# Patient Record
Sex: Female | Born: 1945 | Race: White | Hispanic: No | Marital: Married | Smoking: Current every day smoker
Health system: Southern US, Community
[De-identification: ages and names within clinical notes are randomized; demographics above are authoritative.]

## PROBLEM LIST (undated history)

## (undated) DIAGNOSIS — K589 Irritable bowel syndrome without diarrhea: Secondary | ICD-10-CM

## (undated) DIAGNOSIS — I00 Rheumatic fever without heart involvement: Secondary | ICD-10-CM

## (undated) DIAGNOSIS — K759 Inflammatory liver disease, unspecified: Secondary | ICD-10-CM

## (undated) DIAGNOSIS — I6529 Occlusion and stenosis of unspecified carotid artery: Secondary | ICD-10-CM

## (undated) DIAGNOSIS — E079 Disorder of thyroid, unspecified: Secondary | ICD-10-CM

## (undated) DIAGNOSIS — I639 Cerebral infarction, unspecified: Secondary | ICD-10-CM

## (undated) DIAGNOSIS — I739 Peripheral vascular disease, unspecified: Secondary | ICD-10-CM

## (undated) DIAGNOSIS — E785 Hyperlipidemia, unspecified: Secondary | ICD-10-CM

## (undated) DIAGNOSIS — I219 Acute myocardial infarction, unspecified: Secondary | ICD-10-CM

## (undated) DIAGNOSIS — I669 Occlusion and stenosis of unspecified cerebral artery: Secondary | ICD-10-CM

## (undated) DIAGNOSIS — F419 Anxiety disorder, unspecified: Secondary | ICD-10-CM

## (undated) DIAGNOSIS — F32A Depression, unspecified: Secondary | ICD-10-CM

## (undated) HISTORY — DX: Disorder of thyroid, unspecified: E07.9

## (undated) HISTORY — DX: Hyperlipidemia, unspecified: E78.5

## (undated) HISTORY — DX: Occlusion and stenosis of unspecified carotid artery: I65.29

## (undated) HISTORY — PX: CHOLECYSTECTOMY: SHX55

## (undated) HISTORY — DX: Rheumatic fever without heart involvement: I00

## (undated) HISTORY — DX: Acute myocardial infarction, unspecified: I21.9

## (undated) HISTORY — DX: Occlusion and stenosis of unspecified cerebral artery: I66.9

## (undated) HISTORY — DX: Cerebral infarction, unspecified: I63.9

## (undated) HISTORY — PX: EYE SURGERY: SHX253

## (undated) HISTORY — DX: Inflammatory liver disease, unspecified: K75.9

---

## 1998-11-24 ENCOUNTER — Other Ambulatory Visit: Admission: RE | Admit: 1998-11-24 | Discharge: 1998-11-24 | Payer: Self-pay | Admitting: Family Medicine

## 1999-02-22 DIAGNOSIS — I669 Occlusion and stenosis of unspecified cerebral artery: Secondary | ICD-10-CM

## 1999-02-22 HISTORY — PX: PR VEIN BYPASS GRAFT,AORTO-FEM-POP: 35551

## 1999-02-22 HISTORY — DX: Occlusion and stenosis of unspecified cerebral artery: I66.9

## 1999-11-25 ENCOUNTER — Encounter (INDEPENDENT_AMBULATORY_CARE_PROVIDER_SITE_OTHER): Payer: Self-pay | Admitting: Specialist

## 1999-11-25 ENCOUNTER — Inpatient Hospital Stay: Admission: RE | Admit: 1999-11-25 | Discharge: 1999-11-26 | Payer: Self-pay | Admitting: Vascular Surgery

## 1999-11-25 HISTORY — PX: CAROTID ENDARTERECTOMY: SUR193

## 2003-12-24 ENCOUNTER — Ambulatory Visit (HOSPITAL_COMMUNITY): Admission: RE | Admit: 2003-12-24 | Discharge: 2003-12-24 | Payer: Self-pay | Admitting: Vascular Surgery

## 2003-12-25 ENCOUNTER — Ambulatory Visit: Payer: Self-pay | Admitting: Family Medicine

## 2003-12-30 ENCOUNTER — Ambulatory Visit: Payer: Self-pay

## 2004-01-09 ENCOUNTER — Ambulatory Visit: Payer: Self-pay | Admitting: Family Medicine

## 2004-02-02 ENCOUNTER — Ambulatory Visit: Payer: Self-pay | Admitting: Family Medicine

## 2004-02-09 ENCOUNTER — Ambulatory Visit: Payer: Self-pay | Admitting: Family Medicine

## 2004-02-13 ENCOUNTER — Ambulatory Visit: Payer: Self-pay | Admitting: Family Medicine

## 2004-02-22 HISTORY — PX: CAROTID STENT: SHX1301

## 2004-03-09 ENCOUNTER — Ambulatory Visit: Payer: Self-pay | Admitting: Family Medicine

## 2004-03-11 ENCOUNTER — Ambulatory Visit: Payer: Self-pay | Admitting: Family Medicine

## 2004-03-24 ENCOUNTER — Inpatient Hospital Stay (HOSPITAL_COMMUNITY): Admission: RE | Admit: 2004-03-24 | Discharge: 2004-03-26 | Payer: Self-pay | Admitting: *Deleted

## 2004-03-24 HISTORY — PX: CAROTID ENDARTERECTOMY: SUR193

## 2004-04-13 ENCOUNTER — Ambulatory Visit: Payer: Self-pay | Admitting: Family Medicine

## 2004-04-20 ENCOUNTER — Ambulatory Visit: Payer: Self-pay | Admitting: Family Medicine

## 2004-05-12 ENCOUNTER — Ambulatory Visit: Payer: Self-pay | Admitting: Family Medicine

## 2004-05-27 ENCOUNTER — Ambulatory Visit (HOSPITAL_COMMUNITY): Admission: RE | Admit: 2004-05-27 | Discharge: 2004-05-27 | Payer: Self-pay | Admitting: Vascular Surgery

## 2004-06-08 ENCOUNTER — Ambulatory Visit: Payer: Self-pay | Admitting: Family Medicine

## 2004-06-10 ENCOUNTER — Ambulatory Visit: Payer: Self-pay | Admitting: Cardiology

## 2004-06-18 ENCOUNTER — Ambulatory Visit: Payer: Self-pay | Admitting: Family Medicine

## 2004-07-23 ENCOUNTER — Inpatient Hospital Stay (HOSPITAL_COMMUNITY): Admission: RE | Admit: 2004-07-23 | Discharge: 2004-07-29 | Payer: Self-pay | Admitting: Vascular Surgery

## 2004-08-16 ENCOUNTER — Ambulatory Visit: Payer: Self-pay | Admitting: Family Medicine

## 2004-08-30 ENCOUNTER — Ambulatory Visit: Payer: Self-pay | Admitting: Family Medicine

## 2004-09-15 ENCOUNTER — Ambulatory Visit: Payer: Self-pay | Admitting: Family Medicine

## 2004-09-20 ENCOUNTER — Ambulatory Visit: Payer: Self-pay | Admitting: Family Medicine

## 2004-10-01 ENCOUNTER — Ambulatory Visit: Payer: Self-pay | Admitting: Family Medicine

## 2004-10-18 ENCOUNTER — Ambulatory Visit: Payer: Self-pay | Admitting: Family Medicine

## 2004-11-18 ENCOUNTER — Ambulatory Visit: Payer: Self-pay | Admitting: Family Medicine

## 2004-11-25 ENCOUNTER — Ambulatory Visit: Payer: Self-pay | Admitting: Family Medicine

## 2004-12-20 ENCOUNTER — Ambulatory Visit: Payer: Self-pay | Admitting: Family Medicine

## 2005-01-20 ENCOUNTER — Ambulatory Visit: Payer: Self-pay | Admitting: Family Medicine

## 2005-02-17 ENCOUNTER — Ambulatory Visit: Payer: Self-pay | Admitting: Family Medicine

## 2005-02-23 ENCOUNTER — Ambulatory Visit: Payer: Self-pay | Admitting: Family Medicine

## 2005-03-21 ENCOUNTER — Ambulatory Visit: Payer: Self-pay | Admitting: Family Medicine

## 2005-04-21 ENCOUNTER — Ambulatory Visit: Payer: Self-pay | Admitting: Family Medicine

## 2005-05-23 ENCOUNTER — Ambulatory Visit: Payer: Self-pay | Admitting: Family Medicine

## 2005-06-22 ENCOUNTER — Ambulatory Visit: Payer: Self-pay | Admitting: Cardiology

## 2005-06-22 ENCOUNTER — Ambulatory Visit: Payer: Self-pay | Admitting: Family Medicine

## 2005-07-25 ENCOUNTER — Ambulatory Visit: Payer: Self-pay | Admitting: Family Medicine

## 2005-08-04 ENCOUNTER — Ambulatory Visit: Payer: Self-pay | Admitting: Family Medicine

## 2005-08-29 ENCOUNTER — Ambulatory Visit: Payer: Self-pay | Admitting: Family Medicine

## 2005-09-14 ENCOUNTER — Ambulatory Visit: Payer: Self-pay | Admitting: Family Medicine

## 2005-09-29 ENCOUNTER — Ambulatory Visit: Payer: Self-pay | Admitting: Family Medicine

## 2005-10-31 ENCOUNTER — Ambulatory Visit: Payer: Self-pay | Admitting: Family Medicine

## 2005-12-06 ENCOUNTER — Ambulatory Visit: Payer: Self-pay | Admitting: Family Medicine

## 2005-12-23 ENCOUNTER — Ambulatory Visit: Payer: Self-pay | Admitting: Family Medicine

## 2006-01-06 ENCOUNTER — Ambulatory Visit: Payer: Self-pay | Admitting: Family Medicine

## 2006-01-18 ENCOUNTER — Ambulatory Visit: Payer: Self-pay | Admitting: Family Medicine

## 2006-02-07 ENCOUNTER — Ambulatory Visit: Payer: Self-pay | Admitting: Family Medicine

## 2006-02-15 ENCOUNTER — Ambulatory Visit: Payer: Self-pay | Admitting: Family Medicine

## 2006-03-17 ENCOUNTER — Ambulatory Visit: Payer: Self-pay | Admitting: Family Medicine

## 2006-03-28 ENCOUNTER — Ambulatory Visit: Payer: Self-pay | Admitting: Family Medicine

## 2006-03-30 ENCOUNTER — Ambulatory Visit: Payer: Self-pay | Admitting: Family Medicine

## 2006-04-25 ENCOUNTER — Ambulatory Visit: Payer: Self-pay | Admitting: Vascular Surgery

## 2006-05-18 ENCOUNTER — Ambulatory Visit: Payer: Self-pay | Admitting: Family Medicine

## 2006-05-22 ENCOUNTER — Ambulatory Visit: Payer: Self-pay | Admitting: Family Medicine

## 2006-06-06 ENCOUNTER — Ambulatory Visit: Payer: Self-pay | Admitting: Family Medicine

## 2006-06-22 ENCOUNTER — Ambulatory Visit: Payer: Self-pay | Admitting: Family Medicine

## 2006-07-25 ENCOUNTER — Ambulatory Visit: Payer: Self-pay | Admitting: Family Medicine

## 2006-08-10 ENCOUNTER — Ambulatory Visit: Payer: Self-pay | Admitting: Family Medicine

## 2007-02-26 ENCOUNTER — Ambulatory Visit (HOSPITAL_COMMUNITY): Admission: RE | Admit: 2007-02-26 | Discharge: 2007-02-26 | Payer: Self-pay | Admitting: Family Medicine

## 2007-04-27 ENCOUNTER — Ambulatory Visit (HOSPITAL_COMMUNITY): Admission: RE | Admit: 2007-04-27 | Discharge: 2007-04-27 | Payer: Self-pay | Admitting: Family Medicine

## 2007-05-08 ENCOUNTER — Ambulatory Visit: Payer: Self-pay | Admitting: Vascular Surgery

## 2007-05-08 DIAGNOSIS — I6529 Occlusion and stenosis of unspecified carotid artery: Secondary | ICD-10-CM

## 2007-05-08 HISTORY — DX: Occlusion and stenosis of unspecified carotid artery: I65.29

## 2008-05-06 ENCOUNTER — Ambulatory Visit: Payer: Self-pay | Admitting: Vascular Surgery

## 2009-05-26 ENCOUNTER — Ambulatory Visit: Payer: Self-pay | Admitting: Vascular Surgery

## 2010-01-01 ENCOUNTER — Ambulatory Visit (HOSPITAL_COMMUNITY): Admission: RE | Admit: 2010-01-01 | Payer: Self-pay | Admitting: Family Medicine

## 2010-01-07 ENCOUNTER — Ambulatory Visit (HOSPITAL_COMMUNITY): Admission: RE | Admit: 2010-01-07 | Discharge: 2010-01-07 | Payer: Self-pay | Admitting: Family Medicine

## 2010-06-08 ENCOUNTER — Other Ambulatory Visit: Payer: Self-pay

## 2010-06-22 ENCOUNTER — Other Ambulatory Visit (INDEPENDENT_AMBULATORY_CARE_PROVIDER_SITE_OTHER): Payer: Medicare PPO

## 2010-06-22 DIAGNOSIS — I6529 Occlusion and stenosis of unspecified carotid artery: Secondary | ICD-10-CM

## 2010-06-22 DIAGNOSIS — Z48812 Encounter for surgical aftercare following surgery on the circulatory system: Secondary | ICD-10-CM

## 2010-07-06 NOTE — Procedures (Signed)
CAROTID DUPLEX EXAM   INDICATION:  Follow up left carotid endarterectomy and stent.   HISTORY:  Diabetes:  No.  Cardiac:  No.  Hypertension:  No.  Smoking:  Yes.  Previous Surgery:  Aortobifem bypass graft and left carotid  endarterectomy on 11/25/99 by Dr. Hart Rochester, left CCA stent on 03/24/04 by  Dr. Madilyn Fireman.  CV History:  Amaurosis Fugax No, Paresthesias No, Hemiparesis No.                                       RIGHT             LEFT  Brachial systolic pressure:         136               132  Brachial Doppler waveforms:         Normal            Normal  Vertebral direction of flow:        Antegrade         Antegrade  DUPLEX VELOCITIES (cm/sec)  CCA peak systolic                   87                191 (stent)  ECA peak systolic                   106               303  ICA peak systolic                   200               174  ICA end diastolic                   58                34  PLAQUE MORPHOLOGY:                  Calcific          Heterogenous  PLAQUE AMOUNT:                      Moderate/severe   Mild  PLAQUE LOCATION:                    ICA               ICA/proximal CCA   IMPRESSION:  1. Doppler velocities suggest 60-79% stenosis of the right internal      carotid artery.  2. Patent left distal common carotid artery stent with an increased      velocity of 191 cm/s noted with no focal narrowing evident.  3. Patent left carotid endarterectomy site with Doppler velocities      suggestive of 40-59% stenosis of the left internal carotid artery;      however, this is most likely a result of the increased velocity of      the distal common carotid artery stent.  4. No significant change in Doppler velocities when compared to the      previous examination on 05/08/07.   ___________________________________________  [name]. Hart Rochester, M.D.   CH/MEDQ  D:  05/06/2008  T:  05/07/2008  Job:  [id number]

## 2010-07-06 NOTE — Assessment & Plan Note (Signed)
OFFICE VISIT   [name], [name]  DOB:  28-Dec-1945                                       05/06/2008  ZOXWR#:[id number]   The patient returns for annual followup regarding her carotid occlusive  disease.  She underwent left carotid endarterectomy by me in 2001 and  had a left common carotid stenosis which was stented by Dr. Madilyn Fireman in  2006.  She has had no neurologic symptoms since that time and she has a  moderate right internal carotid stenosis approximating 60% which we have  been following.  She denies any hemispheric or nonhemispheric TIAs,  amaurosis fugax, diplopia, blurred vision or syncope.  She has had no  chest pain or dyspnea on exertion but does not ambulate long distances.  No claudication since her symptoms are noted by her.  She is taking  aspirin 81 mg per day.   PHYSICAL EXAMINATION:  Vital signs:  Blood pressure 133/78, heart rate  67, respirations 14.  Her carotid pulses are 3+ with harsh bruit on the  left.  Neurologic:  Exam is normal with the exception of mild left  marginal mandibular nerve paresis.  Abdomen:  Soft, nontender with no  masses.  She has 3+ femoral, popliteal and posterior tibial pulses  bilaterally.   Carotid duplex exam today is unchanged from last year with some  narrowing within the stent in the left common carotid artery but no  change in the velocities (191 cm per second).  She has a moderate right  internal carotid stenosis approximating 50-60%.   Generally I think she is stable and we will see her on an annual basis  in the carotid stent protocol to be seen by the physician if any  symptoms recur or disease progresses.   [name], M.D.  Electronically Signed   JDL/MEDQ  D:  05/06/2008  T:  05/08/2008  Job:  [id number]

## 2010-07-06 NOTE — Assessment & Plan Note (Signed)
OFFICE VISIT   [name], [name]  DOB:  May 01, 1945                                       05/08/2007  ONGEX#:[id number]   The patient is status post stenting of the left carotid artery by Dr.  Madilyn Fireman in 2006 for recurrent stenosis.  She underwent aortobifemoral  bypass grafting by me in June 2006.  She is not having claudication  symptoms at this time.  She denies any hemispheric or non-hemispheric  TIA, amaurosis fugax, diplopia, blurred vision, syncope, but does have  occasional dizziness.  She has had some back pain and was found to have  some mild hydronephrosis on the left side by MR scanning and will be  seen by [facility], M.D., next week for evaluation.  She  discontinued Plavix because she was having a sick feeling and that at  bedtime improved over the last 3 to 4 months.  She is taking aspirin  instead.   PHYSICAL EXAM:  Blood pressure is 128/74, heart rate is 78, respirations  16.  Her carotid pulses are 3+ with a harsh bruit on the left.  No bruit  on the right.  She is alert and oriented x3.  Neck is supple.  Chest is  clear to auscultation.  Abdomen is soft and nontender with no masses.  No ventral hernia is noted.  There are excellent femoral pulses  bilaterally with well-perfused lower extremities.   LAB STUDIES:  Reveal ABI 0.97 on the right and 0.79 on the left.  Carotid scanning reveals approximately 60-70% right internal carotid  stenosis and some moderate restenosis within the stent of the left  common carotid artery.  We will continue to follow this on an annual  basis, unless she develops any symptoms.   [name], M.D.  Electronically Signed   JDL/MEDQ  D:  05/08/2007  T:  05/09/2007  Job:  [id number]

## 2010-07-06 NOTE — Procedures (Signed)
CAROTID DUPLEX EXAM   INDICATION:  Follow up evaluation of known carotid artery disease.   HISTORY:  Diabetes:  No.  Cardiac:  No.  Hypertension:  No.  Smoking:  The patient has smoked for over 40 years.  Previous Surgery:  Aortobifemoral bypass graft and left carotid  endarterectomy with Dacron patch angioplasty on November 25, 1999, by Dr.  Hart Rochester.  Left carotid stent on March 24, 2004, by Dr. Madilyn Fireman.  CV History:  The patient had 60-79% right ICA stenosis and no left ICA  stenosis on previous study which was performed April 25, 2006.  Patient  has a recently diagnosed kidney blockage.  Amaurosis Fugax No, Paresthesias No, Hemiparesis No                                       RIGHT             LEFT  Brachial systolic pressure:         140               138  Brachial Doppler waveforms:         Triphasic         Triphasic  Vertebral direction of flow:        Antegrade         Antegrade  DUPLEX VELOCITIES (cm/sec)  CCA peak systolic                   54                63  ECA peak systolic                   137               132  ICA peak systolic                   223               124  ICA end diastolic                   46                57  PLAQUE MORPHOLOGY:                  Calcified         Soft  PLAQUE AMOUNT:                      Moderate to severe                  Mild  PLAQUE LOCATION:                    Proximal ICA      Proximal CCA, ICA   IMPRESSION:  1. Left common carotid artery stent proximal 104/40 cm/s, mid 165/65      cm/s, distal 192/64 cm/s.  2. Right ICA stenosis 60-79% (low end of range).  3. Left ICA stenosis 40-59%.  4. Patent left common carotid artery stent with a peak systolic      velocity of 192 cm/s in the distal portion.   ___________________________________________  [name], M.D.   MC/MEDQ  D:  05/08/2007  T:  05/08/2007  Job:  [id number]

## 2010-07-06 NOTE — Procedures (Signed)
CAROTID DUPLEX EXAM   INDICATION:  Follow up left carotid endarterectomy and stent.   HISTORY:  Diabetes:  No.  Cardiac:  No.  Hypertension:  No.  Smoking:  Yes.  Previous Surgery:  Aortobifem bypass graft and left carotid  endarterectomy on 11/25/99 by Dr. Hart Rochester.  Left CCA stent on 03/24/04 by  Dr. Madilyn Fireman.  CV History:  No history of a stroke.  Amaurosis Fugax No, Paresthesias No, Hemiparesis No.                                       RIGHT             LEFT  Brachial systolic pressure:         115               110  Brachial Doppler waveforms:         Triphasic         Triphasic  Vertebral direction of flow:        Antegrade         Antegrade  DUPLEX VELOCITIES (cm/sec)  CCA peak systolic                   95                191 (stent)  ECA peak systolic                   74                Occluded  ICA peak systolic                   172               172  ICA end diastolic                   63                71  PLAQUE MORPHOLOGY:                  Calcific          Heterogenous  PLAQUE AMOUNT:                      Moderate          Mild  PLAQUE LOCATION:                    ICA               ICA, distal common  carotid artery   IMPRESSION:  1. Doppler velocities suggest 40% to 59% stenosis of the right      internal carotid artery.  2. Patent left distal common carotid artery with a velocity of 191      cm/s with no focal narrowing evident.  3. Patent left carotid endarterectomy site with Doppler velocities      suggestive of 40% to 50% of the left internal carotid artery.  4. No significant changes since 05/06/2008.  5. There is evidence of left external carotid artery occlusion.       ___________________________________________  [name], M.D.   NT/MEDQ  D:  05/26/2009  T:  05/26/2009  Job:  [id number]

## 2010-07-06 NOTE — Assessment & Plan Note (Signed)
OFFICE VISIT   [name], [name]  DOB:  [birth year]                                       05/26/2009  XBJYN#:[id number]   The patient returns today for followup involving her carotid occlusive  disease and lower extremity occlusive disease.  She underwent a left  carotid endarterectomy in 2001 and had a left carotid stenosis which was  treated with a stent by Dr. Madilyn Fireman in 2006.  She also had an  aortobifemoral bypass graft in the past.  She is not having any  claudication symptoms at this time but does have discomfort in her legs  at rest.  Denies any hemiparesis, aphasia, amaurosis fugax, diplopia,  blurred vision or syncope and continues taking aspirin 81 mg per day.  She has had some fullness in the anterior aspect of the neck and she  stopped taking her thyroid medicine last week because she ran out of  tablets.   CHRONIC MEDICAL PROBLEMS:  1. Hyperlipidemia.  2. Hypothyroidism.  3. Negative for coronary artery disease, diabetes, COPD or stroke.   FAMILY HISTORY:  Positive for coronary artery disease in a brother,  stroke in an uncle and grandfather.  Negative for diabetes.   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  Smokes a pack of cigarettes every couple of days and does not use  alcohol.   REVIEW OF SYSTEMS:  Negative for chest pain, dyspnea on exertion.  Has  occasional dysphagia, feels like something is in her throat, occasional  dizziness.  All other systems are negative in the review of systems.   PHYSICAL EXAMINATION:  Vital signs:  Blood pressure 129/72, heart rate  55, respirations 14.  General:  She is a well-developed, well-nourished  female in no apparent distress, alert and oriented x3.  HEENT:  Exam is  unremarkable.  She does have some facial asymmetry which appears to be  from left marginal mandibular nerve paresis.  Thyroid is somewhat  prominent in the midline anteriorly.  Carotid pulses are 3+ with  soft  bruits bilaterally.  Neurological:  Normal.  Chest:  Clear to  auscultation.  Cardiovascular:  Regular rhythm.  No murmurs.  Abdomen:  Soft, nontender with no masses.  3+ femoral, popliteal and dorsalis  pedis pulses bilaterally.   Today I ordered carotid duplex exam and lower extremity arterial  Dopplers which I reviewed and interpreted.  Her carotid Dopplers are  unchanged from last year with some mild stenosis in the stent on the  left side and some approximately 70% right internal carotid stenosis  unchanged.  Lower extremity arterial Dopplers reveal ABIs greater than 1  bilaterally.   In general I think she is stable and will return in 1 year for carotid  duplex exam and be seen in the PA clinic.     [name], M.D.  Electronically Signed   JDL/MEDQ  D:  05/26/2009  T:  05/27/2009  Job:  [id number]

## 2010-07-06 NOTE — Procedures (Signed)
LOWER EXTREMITY ARTERIAL EVALUATION-SINGLE LEVEL   INDICATION:  Follow up evaluation of aortobifemoral bypass graft.   HISTORY:  Diabetes:  No.  Cardiac:  No.  Hypertension:  No.  Smoking:  Less than a pack per day.  Previous Surgery:  Aortobifemoral bypass graft by Dr. Hart Rochester on July 23, 2004.   RESTING SYSTOLIC PRESSURES: (ABI)                          RIGHT                LEFT  Brachial:               140                  138  Anterior tibial:        136 (0.97)           108  Posterior tibial:       128                  110 (0.79)  Peroneal:  DOPPLER WAVEFORM ANALYSIS:  Anterior tibial:        Mono to biphasic     Biphasic  Posterior tibial:       Triphasic            Biphasic  Peroneal:   PREVIOUS ABI'S:  Date: 04/25/2006  RIGHT:  0.92  LEFT:  0.92   IMPRESSION:  1. Right ABI is stable from previous study.  2. Left ABI is slightly lower than previously recorded.   ___________________________________________  [name], M.D.   MC/MEDQ  D:  05/08/2007  T:  05/08/2007  Job:  [id number]

## 2010-07-08 NOTE — Procedures (Unsigned)
CAROTID DUPLEX EXAM  INDICATION:  Follow up left CCA stent, left CEA.  HISTORY: Diabetes:  No. Cardiac:  No. Hypertension:  No. Smoking:  Yes. Previous Surgery:  Left CEA 11/25/1999, left CCA stent 03/24/2004. CV History: Amaurosis Fugax No, Paresthesias No, Hemiparesis No.                                      RIGHT             LEFT Brachial systolic pressure:         136               132 Brachial Doppler waveforms:         WNL               WNL Vertebral direction of flow:        Antegrade         Antegrade DUPLEX VELOCITIES (cm/sec) CCA peak systolic                   81                240 (distal stent) ECA peak systolic                   158               Not visualized ICA peak systolic                   264               138 ICA end diastolic                   82                43 PLAQUE MORPHOLOGY:                  Heterogenous      Heterogenous PLAQUE AMOUNT:                      Moderate          Moderate PLAQUE LOCATION:                    ICA               Stent  IMPRESSION: 1. 60% to 79% right internal carotid artery stenosis. 2. Elevated velocities at the left distal stent with obstruction     observed.  Grading of the left internal carotid artery disease is     difficult due to stent disease.  However, carotid endarterectomy     appears widely patent beyond the stent. 3. Antegrade vertebral arteries bilaterally.  ___________________________________________ [name], M.D.  LT/MEDQ  D:  06/22/2010  T:  06/22/2010  Job:  [id number]

## 2010-07-09 NOTE — Op Note (Signed)
NAMEMARIANELA, [name]:  [id number]   MEDICAL RECORD NO.:  [id number]          PATIENT TYPE:  INP   LOCATION:  2899                         FACILITY:  [facility]   PHYSICIAN:  Quita Skye. Hart Rochester, M.D.  DATE OF BIRTH:  Jan 30, 1946   DATE OF PROCEDURE:  07/23/2004  DATE OF DISCHARGE:                                 OPERATIVE REPORT   PREOPERATIVE DIAGNOSIS:  Severe aortoiliac occlusive disease with limiting  claudication.   POSTOPERATIVE DIAGNOSIS:  Severe aortoiliac occlusive disease with limiting  claudication.   OPERATION:  Aortobifemoral bypass graft using a 14 x 8 mm Hemashield Dacron  graft with proximal aortic endarterectomy.   SURGEON:  Quita Skye. Hart Rochester, M.D.   FIRST ASSISTANT:  Jerold Coombe, P.A.   ANESTHESIA:  General endotracheal.   DESCRIPTION OF PROCEDURE:  The patient was taken to the operating room and  placed in the supine position at which time satisfactory general  endotracheal anesthesia was administered. An arterial line and Swan-Ganz  catheter were inserted by anesthesia. The abdomen and groins were prepped  with Betadine scrub and solution and draped in routine sterile manner.  Longitudinal incisions were made in both inguinal regions and carried down  through subcutaneous tissue, and the common superficial and profunda femoris  arteries were dissected free and circled with vessel loops. There was a 2+  palpable pulse on the left and absent pulse on the right. There were small  posterior plaques in both vessels but otherwise they were soft anteriorly. A  midline incision was made from xiphoid to below the umbilicus and carried  down through subcutaneous tissue using the Bovie.  The peritoneal cavity was  entered and thoroughly explored. There were a few adhesions from the greater  omentum in the pelvis which were lysed using the Metzenbaum scissors. The  liver was smooth with no palpable masses. The gallbladder was absent. The  colon  was explored. No masses were found. The stomach, duodenum, and small  bowel were also unremarkable. The transverse colon was elevated and the  intestines reflected to the right side. Retroperitoneum incised exposing the  aorta from the renal arteries to the bifurcation. There was severe diffuse  atherosclerotic changes throughout the infrarenal aorta with some  periadventitial reaction. Circumferential control of the aorta was obtained  distal to the renal arteries. Retroperitoneal tunnels were created posterior  to the ureters and 25 grams of mannitol was given intravenously, and the  patient was heparinized. The aorta was occluded distal to the renal arteries  and transected, and the distal end oversewn with two layers of 3-0 Prolene  buttressing this with two strips of felt. The proximal aortic stump was full  of atheromatous debris which required a full-thickness endarterectomy up to  the clamp. The 14 x 8 mm Hemashield Dacron graft anastomosed end-to-end to  the aortic stump using continuous 3-0 Prolene buttressing this with a strip  of felt. This was checked for leaks and none were present. A short segment  of 8 mm Hemashield Dacron cuff was placed over the endarterectomized segment  of aorta up to  the level of the renal arteries. Limbs were delivered through  the tunnels and the end-to-side anastomoses were done to the common femoral  arteries bilaterally both of which were widely patent. This was done with 5-  0 Prolene. The right leg opened first followed by the left leg with no  significant hypotension. Protamine was then given to reverse the heparin.  Following adequate hemostasis, the wounds were closed in layers with Vicryl  in subcuticular fashion in the groins.  Retroperitoneum was closed with 3-0 Vicryl, linea alba with #1 Prolene, the  skin with clips. Sterile dressing applied. The patient taken to recovery  room in satisfactory condition. Estimated blood loss was 250 mL.  Urine  output was 400 mL. The patient was hemodynamically stable throughout the  case.      JDL/MEDQ  D:  07/23/2004  T:  07/23/2004  Job:  [id number]   cc:   Delaney Meigs, M.D.  [address]  [place]  [place] [address]  Fax: [phone]

## 2010-07-09 NOTE — H&P (Signed)
Lori [name], [name]:  [id number]   MEDICAL RECORD NO.:  [id number]          PATIENT TYPE:  OIB   LOCATION:  2899                         FACILITY:  [facility]   PHYSICIAN:  Quita Skye. Hart Rochester, M.D.  DATE OF BIRTH:  [birth year]   DATE OF ADMISSION:  05/27/2004  DATE OF DISCHARGE:                                HISTORY & PHYSICAL   PREOPERATIVE DIAGNOSIS:  Severe right iliac occlusive disease, with limiting  claudication.   PROCEDURE:  Abdominal aortogram with bilateral lower extremity runoff via  left common femoral approach.   SURGEON:  Quita Skye. Hart Rochester, M.D.   ANESTHESIA:  1.  Local Xylocaine.  2.  Versed 3 mg intravenously.  3.  Fentanyl 100 mcg intravenously.   CONTRAST:  132 cc.   COMPLICATIONS:  None.   DESCRIPTION OF PROCEDURE:  The patient was taken to the [facility]  Peripheral Endovascular Lab and placed in the supine position, at which time  both groins were prepped with Betadine scrub and solution and draped in a  routine sterile manner.  After infiltration with 1% Xylocaine, the right  common femoral artery was accessed percutaneously, but a guide wire would  not pass proximally, suggesting a proximal obstruction.  The left common  femoral artery was entered percutaneously after infiltration with Xylocaine,  although this was quite difficult because of calcific disease in the femoral  vessels.  A guide wire was passed into the suprarenal aorta, a 5 French  sheath and dilator passed over the guide wire, and a standard pigtail  catheter positioned in the suprarenal aorta.  A flush abdominal aortogram  was performed, injecting 20 cc of contrast at 20 cc/second.  This revealed  the aorta to be patent to the renal arteries, with single renal arteries  bilaterally.  There were severe atherosclerotic changes in the infrarenal  aorta, tapering down to a very small caliber near the aortic bifurcation.  Inferior mesenteric artery was patent but small.   There was severe bilateral  iliac occlusive disease, with total occlusion of the right iliac system near  the termination of the common iliac artery, although the internal iliac  artery was patent on the right, serving as a collateral distally.  On the  left, there were severe atherosclerotic changes as well as possible  fibromuscular disease in the left common iliac artery, with a patent left  internal and external iliac artery which were decreased in caliber.  Catheter was withdrawn into the terminal aorta, and bilateral lower  extremity runoff performing, injecting 88 cc of contrast at 8 cc/second.  This revealed the left iliac system to be diffusely diseased, as noted, with  patency throughout, and the left common femoral, superficial femoral, and  profunda femoris arteries appear relatively normal, with widely patent  superficial femoral artery on the left, as well as a patent popliteal artery  and three-vessel runoff on the left, with normal-appearing vessels.  On the  right side, as noted, there was total occlusion of the iliac system, with  reconstitution of the distal external iliac and common femoral artery.  The  right superficial femoral artery and profunda femoris arteries were widely  patent, as was the right popliteal artery.  There was also three-vessel  runoff on the right through small tibial vessels.  Better views of the  runoff on the right were obtained using the peek-hole technique.  Having  tolerated the procedure well, the pigtail catheter was removed over the  guide wire, sheath removed, and adequate compression applied.  No  complications ensued.   FINDINGS:  1.  Diffuse severe aortoiliac occlusive disease, with total occlusion of      right iliac system and reconstitution of the right common femoral artery      and normal runoff on the right.  2.  Diffuse left iliac occlusive disease, with possible fibromuscular      disease in left common iliac artery, and  normal runoff on the left below      the common femoral level.  3.  Single widely patent renal arteries bilaterally.  4.  Patent inferior mesenteric arteries.      JDL/MEDQ  D:  05/27/2004  T:  05/27/2004  Job:  [id number]

## 2010-07-09 NOTE — Discharge Summary (Signed)
[facility]  Patient:    [name], [name]                    MRN: [id number] Adm. Date:  [id number] Disc. Date: [id number] Attending:  Colvin Caroli Dictator:   Loura Pardon, P.A.                           Discharge Summary  REFERRING PHYSICIAN:  Joette Catching, M.D.  CARDIOLOGIST:  Rollene Rotunda, M.D.  FINAL DIAGNOSES: 1. History of left brain transient ischemic attack November 09, 1999 with    right-sided weakness and tingling in the right upper extremity of four    hours duration. 2. Moderate to severe left internal carotid artery stenosis on evaluation at    [facility] November 10, 1999.  SECONDARY DIAGNOSES: 1. History of myocardial infarction. 2. Hypothyroidism. 3. History of hepatitis. 4. Hypercholesterolemia. 5. History of rheumatic fever. 6. History of Bells palsy with residual facial droop. 7. Status post cardiac catheterization, Dr. Daleen Squibb, in 1998. 8. Status post hysterectomy and bilateral salpingo-oophorectomy, remote.  PROCEDURE:  November 25, 1999, left carotid endarterectomy with Dacron patch angioplasty, intraoperative shunting, Dr. Quita Skye. Hart Rochester, Surgery.  The patient tolerated the procedure well and was transferred in stable and satisfactory condition to the recovery room.  DISCHARGE DISPOSITION:  [name] was judged a suitable candidate for discharge on postoperative day #1.  She awoke in the recovery room with her baseline neurologic status, and it has remained at baseline throughout her postoperative convalescence.  She has remained afebrile postoperatively.  She has not experienced any nausea or vomiting.  Her mental status is clear.  Her pain is well controlled with oral analgesia, and her incision is healing well. There is no evidence of swelling, erythema, or drainage.  She is ambulating independently.  She has a good appetite and has been able to tolerate oral intake.  DISCHARGE  MEDICATIONS: 1. Tylox 1-2 tabs p.o. q.4-6h. p.r.n. pain. 2. Aspirin 500 mg daily. 3. Levoxine 100 mcg daily. 4. Ortho-Est 1.25 mg daily. 5. Paxil 20 mg daily. 6. Calcium citrate 350 mg daily.  DISCHARGE ACTIVITY:  She is asked not to lift any weight more than 10 pounds for the next two weeks nor to drive for the next two weeks.  DISCHARGE DIET:  Low sodium, low cholesterol.  WOUND CARE:  She may bathe beginning October 7, Sunday, and she may shower at that time.  She is to keep her incision clean and dry.  She is counseled to stop smoking tobacco products.  FOLLOW-UP:  She is to call Dr. Lysbeth Galas and Dr. Antoine Poche for a follow-up appointment, and she will see Dr. Hart Rochester, Tuesday, December 07, 1999, at 4:20 p.m. for a follow-up visit.  BRIEF HISTORY:  [name] is a pleasant 65 year old female referred by Dr. Lysbeth Galas for evaluation of carotid artery disease.  On November 09, 1999, the patient experienced left brain transient ischemia attack with right-sided weakness and tingling in the right upper extremity.  The symptoms lasted about four hours and spontaneously resolved.  The day after the event, the patient had chest discomfort, and she was evaluated in the emergency room at [facility].  Electrocardiogram was performed without abnormalities demonstrated.  The patient underwent a cardiac evaluation which included a Cardiolite stress test.  This was negative for cardiac disease.  She also underwent carotid duplex ultrasound which revealed moderate to severe left internal carotid  artery stenosis with irregular plaque formation at the bifurcation.  She had antegrade flow in both vertebral arteries.  She was seen by Dr. Hart Rochester in consultation, and he recommended left carotid endarterectomy. In visiting prior to her surgery, the patient denies any new episodes of weakness or paresthesias.  She does have residual facial droop related to a history of Bells palsy.  She has no  difficulty in gait.  She has not fallen or lost consciousness lately.  She has no history of dizziness.  Apparently she did experience an episode of blurred vision which quickly resolved on November 22, 1999.  HOSPITAL COURSE:  Is as noted in the discharge disposition.  [name] remained afebrile in the postoperative period.  Her incision was healing nicely, and her pain was controlled with oral analgesia.  She was ambulating independently.  She was taking oral nourishment well.  Her neurologic status was at baseline and has remained at baseline in the postoperative period.  She goes home with the medications and with the follow-up with Dr. Hart Rochester, as dictated. DD:  11/26/99 TD:  11/28/99 Job: [id number]

## 2010-07-09 NOTE — Op Note (Signed)
[facility]  Patient:    [name], [name]                         MRN: [id number] Proc. Date: 11/25/99 Attending:  Quita Skye. Hart Rochester, M.D. CC:         Delaney Meigs, M.D.   Operative Report  PREOPERATIVE DIAGNOSIS:  Moderately severe left internal carotid artery stenosis, left hemispheric transient ischemic attack.  POSTOPERATIVE DIAGNOSIS:  Moderately severe left internal carotid artery stenosis, left hemispheric transient ischemic attack.  OPERATION:  Left carotid endarterectomy with Dacron patch angioplasty.  SURGEON:  Quita Skye. Hart Rochester, M.D.  FIRST ASSISTANT:  Sherrie George, P.A.  ANESTHESIA:  General endotracheal.  BRIEF HISTORY:  This patient suffered a left hemispheric TIA with clumsiness in the right upper extremity and lasting a few hours and then completely resolving.  She was found to have moderately severe left internal carotid stenosis approximating 70-80%, mild disease on the right side, and scheduled for left carotid endarterectomy.  She does have a remote history of Bells palsy with some left facial weakness persisting.  DESCRIPTION OF PROCEDURE:  The patient was taken to the operating room, placed in the supine position, at which time satisfactory general endotracheal anesthesia was administered.  The left neck was prepped with Betadine scrub and solution, draped in the routine sterile manner.  An incision was made along the anterior border of the sternocleidomastoid and carried down through subcutaneous tissue and platysma using the Bovie.  The common facial vein and external jugular vein were ligated with 3-0 silk ties and divided, exposing the common, internal, and external carotid artery.  Care was taken not to injure the vagus or hypoglossal nerves, both of which were exposed.  There was a calcified atherosclerotic plaque at the carotid bifurcation, extending up the internal carotid artery about 3 cm.  The distal vessel  appeared normal.  A #10 shunt was prepared, and the patient was heparinized.  The carotid vessels were occluded with vascular clamps, a longitudinal opening made in the common carotid with a #15 blade, extended up the internal carotid with the Potts scissors to a point distal to the disease.  There was a plaque causing at least an 80% stenosis with some ulceration on the posterior wall, and the distal vessel appeared normal.  A #10 shunt was inserted without difficulty, re-establishing flow in about two minutes.  Standard endarterectomy was then performed, using the elevator and the Potts scissors, with eversion endarterectomy of the external carotid.  The plaque feathered off the distal internal carotid artery nicely, not requiring any tacking suture.  The lumen was thoroughly irrigated with heparin and saline and all loose debris carefully removed, and the arteriotomy was closed with the patch using continuous 6-0 Prolene.  Prior to completion of the closure, the shunt was removed after about 45 minutes of shunt time.  Following antegrade and retrograde flushing, the closure was completed, re-establishing the flow initially up the external and then up the internal branch.  Carotid was occluded for less than two minutes for removal of the shunt.  Protamine was then given to reverse the heparin.  Following adequate hemostasis, the wound was irrigated with saline and closed with Vicryl in a subcuticular fashion, a sterile dressing applied, and the patient taken to the recovery room in satisfactory condition. DD:  11/25/99 TD:  11/25/99 Job: [id number]

## 2010-07-09 NOTE — Consult Note (Signed)
[name], MCBREARTY NO.:  [id number]   MEDICAL RECORD NO.:  [id number]          PATIENT TYPE:  OIB   LOCATION:  2899                         FACILITY:  [facility]   PHYSICIAN:  Lacy Duverney, M.D.     DATE OF BIRTH:  [birth year]   DATE OF CONSULTATION:  DATE OF DISCHARGE:  12/24/2003                                   CONSULTATION   REFERRING PHYSICIAN:  Dr. Kristen Loader. Early.   REASON FOR CONSULTATION:  Request by Dr. Arbie Cookey to interpret intracranial  views which were obtained during carotid and vertebral arteriogram.   CLINICAL HISTORY:  Not provided.   PROCEDURE:  Aortic arch, right common carotid, left common carotid and  vertebral arteriogram performed by Dr. Arbie Cookey, December 24, 2003.   RIGHT COMMON CAROTID ARTERIOGRAM INTRACRANIAL VIEWS (ANTEROPOSTERIOR AND  LATERAL):  Cross-filling of left anterior cerebral artery vessels with  injection of the right common carotid artery.  Mild ectasia of the right  internal carotid artery cavernous segment.  Fetal-type origin of the right  posterior cerebral artery.  Oblique views were not obtained.  No obvious  aneurysm or vascular malformation detected.   LEFT COMMON CAROTID ARTERIOGRAM INTRACRANIAL VIEWS (ANTEROPOSTERIOR  PROJECTION ONLY):  Hypoplastic A1 segment of the left anterior cerebral  artery.  No significant stenosis otherwise noted.  No aneurysm or vascular  malformation on this single projection detected.   VERTEBRAL ARTERIOGRAM INTRACRANIAL VIEWS (LATERAL VIEW ONLY):  No  significant stenosis of the injected vertebral artery or the basilar artery,  although there is mild ectasia of the vertebrobasilar system.  No obvious  aneurysm on this single projection.   IMPRESSION:  No significant intracranial major vascular stenosis detected on  limited images submitted as described above.       SO/MEDQ  D:  12/25/2003  T:  12/26/2003  Job:  [id number]   cc:   [facility] Mirian Mo

## 2010-07-09 NOTE — Op Note (Signed)
NAMERAMINA, HULET NO.:  [id number]   MEDICAL RECORD NO.:  [id number]          PATIENT TYPE:  OIB   LOCATION:  3302                         FACILITY:  [facility]   PHYSICIAN:  Balinda Quails, M.D.    DATE OF BIRTH:  1945/08/03   DATE OF PROCEDURE:  03/24/2004  DATE OF DISCHARGE:                                 OPERATIVE REPORT   PHYSICIANS:  1.  Denman George, M.D.  2.  Randa Evens, M.D.   PROCTORING PHYSICIAN:  Kerby Nora, M.D.   ACCESS:  Left common femoral artery 6-French sheath.   CONTRAST:  145 mL Visipaque.   COMPLICATIONS:  None apparent.   PROCEDURE:  1.  Selective left carotid arteriogram.  2.  Left common carotid angioplasty and stenting with 10 x 30 Acculink      extent with 6.5 ACCUNET Embolic Protection System.  3.  AngioSeal closure left common femoral artery.   CLINICAL NOTE:  [name] is a 65 year old female with a recurrent left  common carotid stenosis. This is at the proximal margin of her previous  endarterectomy site. She was referred by Dr. Hart Rochester for an evaluation of  carotid stenting. She has been seen in consultation also by Dr. Delia Heady. She is brought to the cath lab at this time for left carotid  stenting.   Risks of this procedure with a major morbidity mortality of 3 to 5%  including stroke had been discussed with the patient and family.   PROCEDURE NOTE:  The patient brought to the cath lab in stable condition.  Placed in supine position. Adequate hemodynamic monitoring carried out. Two  liters nasal cannula O2 administered. Left groin prepped and draped in  sterile fashion. The patient ministered at 50 mcg of Fentanyl intravenously.  The skin and  subcutaneous tissue of the left groin instilled with  1%  Xylocaine. The needle was easily induced left common femoral artery.  A  0.035 J-wire passed through the needle into the mid abdominal aorta. A short  5-French sheath was then advanced over the  guidewire. The guidewire removed  and a stiff angled glide wire was advanced through the sheath. A short  sheath then removed and a 6-French shuttle sheath was advanced over the  guidewire. The shuttle sheath advanced into the descending thoracic aorta. A  V-tach catheter was then advanced over the guidewire. The patient then  administered 7000 units heparin intravenously. ACT measured 315 seconds.   The guidewire was then advanced in the aortic arch. With 40 degrees LAO  projection. The V-tach catheter was advanced over the guidewire. The V-tach  catheter reformed in the descending aorta. The catheter then advanced up  into the arch. The V-tach catheter easily engaged into the left common  carotid artery. 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   Left common carotid injections were then made with cervical and intracranial  views in the AP, lateral, and oblique projections. There was a recurrent  left common carotid stenosis just proximal to the carotid bifurcation. This  image was most accurately seen with the  oblique projection.   The angled stiff Glidewire was then advanced through the V-tach catheter and  positioned just proximal to the area of recurrent stenosis. The 6-French  shuttle sheath was then advanced telescoping over the V-tach catheter and  advanced into the mid left common carotid artery. Following this, the guide  of the Glidewire and V-tach catheter were removed. With road mapping, a 6.5  ACCUNET Embolic Protection System was then advanced across the recurrent  stenosis and up into the left internal carotid artery. This was advanced up  adjacent to the base of the skull and the device was deployed. Following  deployment, injection was carried out to assure apposition of the device  against the internal carotid artery wall.   A 10 x 30 Acculink stent was then advanced over the guidewire and positioned  at the area of recurrent stenosis and deployed  appropriately. Stent was  deployed appropriately. Following stent deployment, a 7 x 2 Viatrac balloon  was advanced over the guidewire and deployed at 8 atmospheres x10 seconds.   The balloon then removed. A completion arteriogram obtained. This revealed  minimal residual stenosis. The patient remained neurologically intact  throughout the procedure.   A recovery catheter was then advanced over the guidewire and the embolic  protection device trapped and removed without apparent complication.  Following removal of the protection device, a completion arteriogram was  obtained with AP and lateral intracranial views.   The shuttle sheath was then drawn back into the aortic arch. This was then  brought down into the external iliac artery. Retrograde left femoral  arteriogram obtained. This revealed a normal-appearing left common femoral  artery. Guidewire was then reinserted and the left common femoral artery  closed with Angio-Seal device.   There were no apparent complications.  The patient remained neurologically  intact throughout the procedure. The patient transferred to 3300 in stable  condition.   FINAL IMPRESSION:  1.  Recurrent left common carotid artery stenosis status post left carotid      endarterectomy.  2.  Successful deployment of Acculink stent with distal protection in the      left common carotid artery, with minimal residual stenosis.      PGH/MEDQ  D:  03/24/2004  T:  03/24/2004  Job:  [id number]   cc:   Pramod P. Pearlean Brownie, MD  Fax: [phone]   Cath Lab [facility]

## 2010-07-09 NOTE — H&P (Signed)
NAMESHAKARI, [name]                ACCOUNT NO.:  [id number]   MEDICAL RECORD NO.:  [id number]          PATIENT TYPE:  OIB   LOCATION:  NA                           FACILITY:  [facility]   PHYSICIAN:  [name], P.A.DATE OF BIRTH:  02/01/1946   DATE OF ADMISSION:  03/24/2004  DATE OF DISCHARGE:                                HISTORY & PHYSICAL   CHIEF COMPLAINT:  Recurrent carotid artery disease.   HISTORY OF PRESENT ILLNESS:  [name] is a pleasant 65 year old female who  is referred by Dr. Lysbeth [name] for evaluation of recurrent carotid artery  disease.  The patient is well known to [facility] after undergoing a left carotid  endarterectomy in October 2001 by Dr. Hart [name] for symptomatic internal  carotid artery stenosis.  The patient had been doing quite well since then  but was found to have a new bruit of the left neck.  A follow up carotid  Doppler revealed an 80% recurrent left common carotid stenosis at the base  of the endarterectomy.  This was completed in May 2005.  This stenosis was  then found to have progressed to a 95% stenosis by November 2005.  At that  time, Dr. Hart [name] recommended surgical intervention for reduction of stroke  risk.  The patient underwent a cerebral arteriogram completed by Dr. Arbie [name]  on December 24, 2003.  This revealed a widely patent right common carotid  artery and a severe, greater than 90% stenosis of the prior endarterectomy  on the left.  The patient was then seen in the [facility] office by Dr. Madilyn [name] on  December 29, 2003.  At that time, Dr. Madilyn [name] discussed in detail the surgical  versus stenting options.  The patient voiced understanding of these options  and wished to proceed with carotid artery stent placement.  The patient  presents today for routine preoperative history and physical assessment.   Currently, the patient admits to occasional dizziness.  She denies any  recent history of headache, nausea, vomiting, vertigo,  numbness, tingling,  muscle  weakness, speech impairment, dysphagia, vision changes, syncope,  presyncope, memory loss, and confusion.  She denies any recent signs or  symptoms of TIAs/CVA or amaurosis fugax.  The patient denies any recent  cough or cold like symptoms, specifically fever, chills, night sweats,  nausea, vomiting, diarrhea, or constipation.   PAST MEDICAL/ SURGICAL HISTORY:  1.  Extracerebral cardiovascular occlusive disease as described above.  2.  History of TIAs prior to carotid endarterectomy in 2001.  3.  Hyperlipidemia.  4.  Hepatitis.  5.  Questionable history of rheumatic fever.  6.  Tobacco abuse of which she continues to this date.   PAST SURGICAL HISTORY:  1.  Left carotid endarterectomy in October 2001.  2.  Bilateral salpingo-oophorectomy and hysterectomy.   CURRENT MEDICATIONS:  1.  Lipitor 40 mg daily.  2.  Ecotrin 500 mg daily.  3.  Levothyroxine 100 mcg daily.  4.  Fish oil 1000 mg daily.  5.  Citracal 1 tablet daily.  6.  Spectrovite 1 tablet daily.  7.  B12 shot once monthly.  8.  Plavix 75 mg daily.   ALLERGIES:  Penicillin.   REVIEW OF SYMPTOMS:  Please see HPI for pertinent positives and negatives,  otherwise as follows.  The patient denies any history of diabetes, kidney  disease, asthma, or pulmonary problems.  She denies any recent history of  chest pain, chest pressure, palpitations, shortness of breath, or dyspnea on  exertion.  She denies any hematochezia, melena, hematuria, dysuria, urgency,  or frequency.  She admits to occasional headache and sinusitis.   FAMILY HISTORY:  The patient's sister had a history of cancer.  Otherwise,  her family history is negative for heart disease, diabetes, or pulmonary  problems.   SOCIAL HISTORY:  The patient is married and has four  children.  She is a  housewife.  She denies any alcohol use.  She has a history of tobacco abuse  half pack per day for 45 years, which  she continues.   PHYSICAL EXAMINATION:  VITAL SIGNS:   Blood pressure 140/70, pulse 64 and regular, respirations 16  and unlabored.  Height 5 feet and weight 142 pounds per patient report.  GENERAL:  This is a pleasant 65 year old white female who appears older than  her stated age.  She is in no apparent distress and is alert and oriented x  3.  HEENT:  NCAT.  PERRLA, EOMI, no evidence of cataract or glaucoma.  Oral  mucosa pink and moist without exudate or lesion.  NECK:  Supple, there is a harsh left carotid bruit, there is no evidence of  right carotid bruit.  She had no JVD or lymphadenopathy.  CHEST:  Symmetrical on inspiration.  Her lungs sounds are clear to  auscultation without wheezes, rhonchi, or rub.  CARDIAC:  Regular rate and rhythm without murmurs, gallops, and rubs.  ABDOMEN:  Soft, nontender, nondistended with good bowel sounds in all four  quadrants.  GU/RECTAL:  Deferred.  EXTREMITIES:  Warm and dry without cyanosis, clubbing, edema, or  ulcerations.  VASCULAR:  Peripheral pulses are as follows:  3+ carotid pulse on the left  and 2+ on the right.  2+ femoral pulses bilaterally, 1+ dorsalis pedes  pulses bilaterally, and nonpalpable posterior tibial pulses.  NEUROLOGICAL:  No focal deficits.  Her gait is steady.  Her muscle strength  is equal and appropriate throughout.   ASSESSMENT/PLAN:  [name] is a pleasant 65 year old female with severe,  asymptomatic, recurrent left internal carotid artery stenosis.  We will  admit her to [facility] on March 24, 2004, for carotid stent  placement by Dr. Madilyn [name].  The patient will continue her aspirin and Plavix up  to the day of surgery.  Dr. Madilyn [name] has seen and evaluated the patient prior  to this admission and has explained the risks, benefits, and alternatives to  the procedure.  The patient has understanding and agrees to proceed as  planned.      CAF/MEDQ  D:  03/22/2004  T:  03/22/2004  Job:  [id number]  cc:   [name], M.D.  [address]   [place]  [place] [address]  Fax: [phone]

## 2010-07-09 NOTE — Discharge Summary (Signed)
[name], [name]                ACCOUNT NO.:  [id number]   MEDICAL RECORD NO.:  [id number]          PATIENT TYPE:  INP   LOCATION:  3302                         FACILITY:  [facility]   PHYSICIAN:  Balinda Quails, M.D.    DATE OF BIRTH:  27-Aug-1945   DATE OF ADMISSION:  03/24/2004  DATE OF DISCHARGE:  03/26/2004                                 DISCHARGE SUMMARY   HISTORY OF PRESENT ILLNESS:  The patient is a 65 year old female referred by  Dr. Lysbeth Galas for evaluation of recurrent carotid artery disease.  The patient  is well known to [facility] after undergoing a left carotid endarterectomy in  October of 2001 by Dr. Hart Rochester for symptomatic internal carotid artery  stenosis.  The patient had been doing well, but was found to have a new  bruit in her left neck.  Followup carotid duplex study showed 80% recurrent  left common stenosis at the base of the endarterectomy.  This was in May of  2005.  The stenosis was found to have progressed to a 95% stenosis in  November of 2005.  A cerebral arteriogram was done on December 24, 2003,  which revealed a widely patent right common carotid artery and a severe  greater than 90% stenosis of the prior endarterectomy on the left.  The  patient was evaluated by Dr. Madilyn Fireman on December 29, 2003.  He reviewed  surgical versus stenting options.  The patient was agreeable to proceed with  carotid artery stenting and was admitted this hospitalization for the  procedure.   PAST MEDICAL HISTORY:  1.  Extracranial cerebrovascular occlusive disease.  2.  History of transient ischemic attacks with prior carotid endarterectomy      in 2001.  3.  Hyperlipidemia.  4.  Hepatitis.  5.  Questionable history of rheumatic fever.  6.  Tobacco abuse ongoing.   PAST SURGICAL HISTORY:  1.  Left carotid endarterectomy in October of 2001.  2.  Bilateral salpingo-oophorectomy and hysterectomy.   MEDICATIONS PRIOR TO ADMISSION:  1.  Lipitor 40 mg daily.  2.  Ecotrin 500 mg  daily.  3.  Levothyroxine 100 mcg daily.  4.  Fish oil 1000 mg daily.  5.  Citracal one tablet daily.  6.  Spectravite one tablet daily.  7.  B12 shot once monthly.  8.  Plavix 75 mg daily.   ALLERGIES:  PENICILLIN.   FAMILY HISTORY:  Please see the history and physical done at the time of  admission.   SOCIAL HISTORY:  Please see the history and physical done at the time of  admission.   REVIEW OF SYSTEMS:  Please see the history and physical done at the time of  admission.   PHYSICAL EXAMINATION:  Please see the history and physical done at the time  of admission.   HOSPITAL COURSE:  The patient was admitted electively and taken to the  cardiovascular suite where she underwent left carotid arteriogram with left  common carotid artery PTA and stenting of the distal portion.  The patient  tolerated the procedure well and was taken to the postanesthesia  care unit  in stable condition.   POSTOPERATIVE HOSPITAL COURSE:  The patient did well.  She did have some  lability in her blood pressure initially requiring dopamine.  This was able  to be weaned off, however.  She remained neurologically intact.  The  catheterization site showed no evidence of hematoma or ongoing bleeding.  Laboratory status was stable with a mild postoperative anemia with  hemoglobin and hematocrit 11 and 33, respectively.  The BUN and creatinine  were 9 and 0.8, respectively.  The patient tolerated routine progression in  activities and was deemed to be acceptable for discharge on March 26, 2004.   MEDICATIONS ON DISCHARGE:  1.  Lipitor 40 mg daily.  2.  Ecotrin 500 mg daily.  3.  Levothyroxine 100 mcg daily.  4.  Fish oil 1000 mg daily.  5.  Citracal one tablet daily.  6.  Spectravite one tablet daily.  7.  B12 shot once monthly.  8.  Plavix 75 mg daily.  9.  Aspirin 81 mg daily.   INSTRUCTIONS:  The patient received written instructions in regard to  medications, activity, diet, wound care  and followup.   FOLLOWUP:  Will include Dr. Madilyn Fireman on April 26, 2004.   FINAL DIAGNOSES:  1.  Severe recurrent carotid artery stenosis, status post left carotid      endarterectomy in 2001.  2.  Extracranial cerebrovascular occlusive disease.  3.  History of transient ischemic attacks with prior carotid endarterectomy      in 2001.  4.  Hyperlipidemia.  5.  Hepatitis.  6.  Questionable history of rheumatic fever.  7.  Tobacco abuse ongoing.   CONDITION ON DISCHARGE:  Stable and improved.      WEG/MEDQ  D:  05/25/2004  T:  05/25/2004  Job:  [id number]   cc:   [facility]   Delaney Meigs, M.D.  [address]  [place]  [place] [address]  Fax: [phone]

## 2010-07-09 NOTE — Consult Note (Signed)
NAMEKEGAN, [name]                ACCOUNT NO.:  [id number]   MEDICAL RECORD NO.:  [id number]          PATIENT TYPE:  OIB   LOCATION:  3302                         FACILITY:  [facility]   PHYSICIAN:  Janeece Riggers. Karin Golden, M.D.   DATE OF BIRTH:  03/06/1945   DATE OF CONSULTATION:  DATE OF DISCHARGE:                                   CONSULTATION   REASON FOR CONSULTATION:  Interpretation of intracranial views from a  cerebral arteriogram done on March 24, 2004 by Drs. Madilyn Fireman and Dwale.   FINDINGS:  Left internal carotid arteriogram.  Intracranial views were  performed in three planes, before and after left carotid intervention.   The internal carotid artery is widely patent into the brain.  No siphon  stenosis.  The contrast from this injection fills the left middle cerebral  artery territory, the left anterior cerebral artery territory and gives some  pulsatile flow to the left posterior cerebral artery territory.  Flow is  considerably improved following the procedure.  There is no evidence of  stenosis, aneurysm, or vascular malformation.  There is no evidence of  embolic complication.  Venous and parenchymal phases are normal.   IMPRESSION:  No evidence of intracranial embolic complication on the left.      MES/MEDQ  D:  03/25/2004  T:  03/25/2004  Job:  [id number]

## 2010-07-09 NOTE — H&P (Signed)
[facility]  Patient:    [name], [name]                         MRN: [id number] Adm. Date:  11/24/99 Attending:  Quita Skye. Hart Rochester, M.D. Dictator:   Marlowe Kays, P.A.                         History and Physical  DATE OF BIRTH:  04/26/1945  CHIEF COMPLAINT:  Left internal carotid artery stenosis.  REFERRING PHYSICIAN:  Dr. Allen Derry.  CARDIOLOGIST:  Dr. Daleen Squibb.  HISTORY OF PRESENT ILLNESS:  [name] is a pleasant 65 year old white female referred by Dr. Allen Derry for evaluation of carotid artery disease. On November 09, 1999, the patient experienced left brain TIA with right sided weakness and tingling in her right upper extremity compounded by sharp pain in the right ear. These symptoms lasted for about 4 hours and spontaneously resolved. The day after the event, the patient had chest discomfort which prompted her to be evaluated in the emergency room at [facility]. An EKG was performed and showed no abnormalities. The patient underwent a cardiac evaluation and cardiolite was negative for cardiac disease. Carotid Dopplers revealed moderate to severe left ICA stenosis with irregular plaque formation at the bifurcation, as well as antegrade flow in both vertebral arteries. The patient was referred to Dr. Hart Rochester for evaluation. New Dopplers at the [facility] office confirmed the findings. Dr. Hart Rochester recommended left CEA to reduce the risk of stroke. In the visit prior to her surgery, the patient denies any new episodes of weakness or paresthesias. She does have facial droop related to a history of Bells palsy. No difficulty standing or walking. No falling. No impaired speech. No headache, nausea, vomiting or vertigo. No dizziness. On November 22, 1999, the patient experienced 1 episode of blurred vision, quickly resolved. No amaurosis fugax. No seizure, dysphagia, confusion, or memory loss.  PAST MEDICAL HISTORY: 1. Extracranial cerebrovascular occlusive  disease. 2. 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 3. Status post mild MI, remote. 4. History of rheumatic fever as a child. 5. Hypercholesterolemia. 6. History of hepatitis, remote, unknown type. 7. Hypothyroidism. 8. Ejection fraction 73% with no ischemia per cardiolite on November 17, 1999. The patient is cleared by Dr. Antoine Poche.  PAST SURGICAL HISTORY: 1. Status post cardiac catheterization, Dr. Daleen Squibb, 1998. 2. Status post hysterectomy and bilateral salpingo-oophorectomy, remote.  MEDICATIONS: 1. Aspirin 500 mg q.d. 2. Levoxine 100 mcg q.d. 3. Ortho-est 1.25 mg q.d. 4. Paxil 20 mg q.d. 5. Calcium citrate 350 mg q.d.  ALLERGIES:  PENICILLIN (swelling), IVP DYE (nausea). MORPHINE SULFATE (GI symptoms).  REVIEW OF SYSTEMS:   See HPI and past medical history for significant positives otherwise the rest of the review of systems is essentially negative.  FAMILY HISTORY:  Mother alive 57, history of hypertension. Father alive 68, history of CAD status post CABG. One sister deceased with cancer at age 75.  SOCIAL HISTORY:  Married 4 children. She is a housewife. She smokes 1.5 packs of tobacco for the last 30 years. She does not have any intentions of quitting the habit. No alcohol intake.  PHYSICAL EXAMINATION:  GENERAL:  This is a well-developed, well-nourished, 65 year old white female in no acute distress, alert and oriented x 3 who looks slightly older than he stated age.  VITAL SIGNS:  Blood pressure 120/66 on the left, 130/68 in  the right, pulse 66, respirations 16.  HEENT:  Normocephalic, atraumatic. PERRLA. EOMI. No cataracts, arcus senilis or glaucoma.  NECK:  Supple. No JVD. There is a left bruit audible. No thyromegaly or lymphadenopathy.  CHEST:  Symmetrical on inspirations with no wheezes. There is a left upper lobe trace of rhonchi which is expiratory. No rales. Respirations nonlabored. No axillary or supraclavicular  lymphadenopathy.  CARDIOVASCULAR:  Regular rate and rhythm. 1/6 short systolic murmur. Normal S1, S2. No rubs or gallops.  ABDOMEN:  Soft, nontender. Bowel sounds x 4. No hepatosplenomegaly, masses palpable or abdominal bruits.  GU/RECTAL:  Deferred.  EXTREMITIES:  There is clubbing. No nail formation on the middle finger on the right hand. No cyanosis or edema. Skin reveals no ulcerations. Normal hair pattern, warm temperature.  PERIPHERAL PULSES:  Carotid 2+ bilaterally with a soft bruit on the left. No bruit on the right. Femoral 2+ bilaterally with no bruits. Popliteal, dorsalis pedis and posterior tibialis 2+ bilaterally.  NEUROLOGIC:  Grossly normal. Normal gait. Deep tendon reflexes 2+ bilaterally. Muscle strength 5/5.  ASSESSMENT/PLAN:  A 65 year old white female with left internal carotid artery stenosis who will undergo CEA on the left side by Dr. Hart Rochester at [facility] on November 24, 1999. Dr. Hart Rochester has seen and evaluated this patient prior to the admission and has explained the risks and benefits involved in the procedure and the patient has agreed to continue. DD:  11/23/99 TD:  11/23/99 Job: [id number]

## 2010-07-09 NOTE — H&P (Signed)
[name], [name]                ACCOUNT NO.:  [id number]   MEDICAL RECORD NO.:  [id number]          PATIENT TYPE:  INP   LOCATION:                               FACILITY:  [facility]   PHYSICIAN:  Quita Skye. Hart Rochester, M.D.  DATE OF BIRTH:  1945/11/28   DATE OF ADMISSION:  07/23/2004  DATE OF DISCHARGE:                                HISTORY & PHYSICAL   HISTORY OF PRESENT ILLNESS:  The patient is a 65 year old female patient  well-known to [facility] having a history of extracranial  cerebrovascular occlusive disease having undergone previous carotid  endarterectomy and stenting on the left side.  The patient has had  complaints over the past several months of increasing right hip  claudication.  The symptoms are also present on the left, but to a much  lesser extent.  The pain does radiate down to the lower extremities but  there is no numbness or motor difficulties.  Patient denies history of  tissue loss or nonhealing wounds/ulcerations.  The claudication occurs at  less than one block.  She has a known history of long-standing tobacco use.  She underwent arteriography performed by Dr. Hart Rochester in April of 2006.  An  abdominal aortogram with bilateral lower extremity runoff revealed diffuse  severe aortoiliac occlusive disease with total occlusion of the right iliac  system and reconstitution of the right common femoral artery and normal  runoff to the right.  Additionally, there was diffuse left iliac occlusive  disease with possible fibromuscular disease in the left common iliac artery  and normal runoff on the left below the common femoral level.  The renal  arteries were widely patent.  The patient's inferior mesenteric arteries  were also patent.  It was Dr. Candie Chroman recommendation that the patient  proceed with aortobifemoral bypass grafting for relief of lifestyle limiting  symptomatology.  Patient denies abdominal pain, nausea, or vomiting.  She  does have some difficulty  with constipation.  She denies hematochezia,  hematemesis, peripheral edema, dysuria, hematuria, and reflux.  She does  have occasional lower back pain.  She has no history of cardiac disease with  the exception of a possible history of rheumatic fever and she has been told  she has a heart murmur.  She will be admitted this hospitalization for the  procedure electively on July 23, 2004.   PAST MEDICAL HISTORY:  1.  Extracranial cerebrovascular occlusive disease with history of TIAs in      2001.  2.  Hyperlipidemia.  3.  History of hepatitis.  4.  History of rheumatic fever.  5.  History of tobacco abuse.   PAST SURGICAL HISTORY:  1.  Left carotid stent February 2006 by Dr. Madilyn Fireman.  2.  Left carotid endarterectomy October 2001 by Dr. Hart Rochester.  3.  Total abdominal hysterectomy with bilateral salpingo-oophorectomy.   ALLERGIES:  PENICILLIN which causes swelling and MORPHINE causes GI  symptoms, specifically vomiting.   MEDICATIONS PRIOR TO ADMISSION:  1.  Lopressor 25 mg b.i.d.  2.  Aspirin 81 mg daily.  3.  Citracal one daily.  4.  Plavix 75 mg daily.  5.  Synthroid 100 mcg daily.  6.  Lipitor 40 mg daily.  7.  Spectrovite one daily.  8.  Fish oil 1000 mg per day.   REVIEW OF SYSTEMS:  Remarkable for migraine headaches, shortness of breath,  night sweats, and neck stiffness.  Otherwise, denies full system review.   SOCIAL HISTORY:  She is married with four children.  Lives with her husband.  Alcohol use:  None.  Tobacco use is currently one-half pack per day.  Her  history is for smoking 43 years.  Occupation:  She works as a housewife.   FAMILY HISTORY:  She has one sister deceased from cancer, otherwise  unremarkable.   PHYSICAL EXAMINATION:  VITAL SIGNS:  Blood pressure 110/68, heart rate 68,  respirations 16.  GENERAL:  This is a 65 year old white female in no acute distress.  HEENT:  Normocephalic, atraumatic.  Pupils are equal, round, and reactive to  light.   Extraocular movements intact.  Oral mucosa is pink and moist.  No  lesions.  Sclerae is anicteric.  Mouth:  She has upper dentures.  NECK:  Supple with palpable carotid arteries.  There is a left-sided carotid  bruit.  RESPIRATORY:  Breath sounds are symmetrical on inspiration, unlabored.  CARDIAC:  Regular rate and rhythm.  S1, S2 without murmurs, gallops, or  rubs.  ABDOMEN:  Soft, nontender, moderately obese.  Normoactive bowel sounds.  No  masses.  No bruits.  No hepatosplenomegaly.  GENITOURINARY:  Deferred.  RECTAL:  Deferred.  EXTREMITIES:  Unremarkable.  No clubbing, cyanosis, edema.  PERIPHERAL PULSES:  Femoral:  Absent on the right, otherwise palpable  dorsalis pedis and posterior tibial.  Femorals palpable on the left and  dorsalis pedis and posterior tibial are palpable.  NEUROLOGIC:  Nonfocal.  Gait is steady.  Strength is 5+ and equal  bilaterally.  Deep tendon reflexes are 2+ bilaterally.   ASSESSMENT:  1.  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  2.  Heavy tobacco use.  3.  Extracranial cerebrovascular occlusive disease.  4.  Hyperlipidemia.  5.  History of hepatitis.  6.  History of rheumatic fever.  7.  Previous surgeries as listed.   PLAN:  Aortobifemoral bypass grafting as discussed.      WEG/MEDQ  D:  07/21/2004  T:  07/21/2004  Job:  [id number]   cc:   Delaney Meigs, M.D.  [address]  [place]  [place] [address]  Fax: [phone]

## 2010-07-09 NOTE — Op Note (Signed)
[name], ARTIST                ACCOUNT NO.:  [id number]   MEDICAL RECORD NO.:  [id number]          PATIENT TYPE:  OIB   LOCATION:  2899                         FACILITY:  [facility]   PHYSICIAN:  Larina Earthly, M.D.    DATE OF BIRTH:  04-29-1945   DATE OF PROCEDURE:  12/24/2003  DATE OF DISCHARGE:                                 OPERATIVE REPORT   PREOPERATIVE DIAGNOSIS:  Recurrent left carotid stenosis.   POSTOPERATIVE DIAGNOSES:  1.  Recurrent left carotid stenosis.  2.  Right iliac occlusive disease.   PROCEDURES:  1.  Arch and cerebral arteriogram.  2.  Abdominal aortic and pelvic arteriogram.   SURGEON:  Larina Earthly, M.D.   ANESTHESIA:  1% lidocaine local.   COMPLICATIONS:  None.   DISPOSITION:  To recovery room, stable.   PROCEDURE IN DETAIL:  The patient was taken to the peripheral vascular  catheterization laboratory and placed in the supine position where the area  of both groins was prepped and draped in the usual sterile fashion.  Actually, the right common femoral was attempted to be accessed.  There was  a diminished, but palpable pulse and this was not successful.  Therefore,  the left common femoral artery was easily entered and a guide wire was  passed up to the level of the suprarenal aorta.  A 5 French sheath was  passed over the guide wire and a long pigtail catheter was positioned at the  level of the ascending arch.  The 40 degree LAO projection was undertaken.  This revealed bovine arch.  The proximal vessels were patent.  There was  some calcified plaque at the takeoff of the left subclavian artery.  Using a  Simmons catheter, the right common carotid artery was accessed.  AP,  lateral, intracranial and cervical projections were undertaken.  The  intracranial views will be dictated in as a separate note by [facility].  This revealed no evidence of stenosis in the extracranial  carotid artery with mild plaque formation.  Next, a Kerber CK1  catheter was  used to selectively catheterize the left common carotid artery.  Again, AP  and lateral projections were undertaken of this.  This revealed a  significant greater than 90% stenosis of the common carotid artery at its  proximal portion of the patch.  The remaining portion of the endarterectomy  was patent as was the internal carotid artery above this.  The arch  injection did reveal a very small atretic right vertebral and a large left  vertebral artery with no evidence of stenosis as it arose from the left  subclavian artery.  The pigtail catheter again was replaced and the aortic  and pelvic injection was undertaken.  This revealed single widely patent  renal arteries bilaterally.  There was a small infrarenal aorta with  irregularity.  There was occlusion of the small atretic common iliac artery  with collateral formation to reconstitute the common femoral artery.  The  left iliac system was also small, but no evidence of flow-limiting stenosis.  The patient tolerated the procedure without  immediate complications and was  transferred to the holding area in stable condition.   FINDINGS:  1.  Widely patent right common artery.  2.  Bovine arch.  3.  Severe greater than 90% stenosis of the prior endarterectomy on the left      at the proximal portion of the patch.  4.  Widely patent left vertebral artery.  5.  Right external artery occlusion with reconstitution of the common      femoral artery at the groin.      Todd   TFE/MEDQ  D:  12/24/2003  T:  12/24/2003  Job:  [id number]   cc:   Quita Skye. Hart Rochester, M.D.  [address]  [place]  [place] [address]

## 2010-07-09 NOTE — Discharge Summary (Signed)
[name], [name]:  [id number]   MEDICAL RECORD NO.:  [id number]          PATIENT TYPE:  INP   LOCATION:  2034                         FACILITY:  [facility]   PHYSICIAN:  Quita Skye. Hart Rochester, M.D.  DATE OF BIRTH:  June 18, 1945   DATE OF ADMISSION:  07/23/2004  DATE OF DISCHARGE:  07/28/2004                                 DISCHARGE SUMMARY   ADMISSION DIAGNOSIS:  Severe aorto-iliac occlusive disease with limiting  claudication.   DISCHARGE DIAGNOSIS:  1.  Severe aorto-iliac occlusive disease with limiting claudication status      post aorto-bi-femoral bypass graft.  2.  Extracranial cerebrovascular occlusive disease with history of transient      ischemic attacks in 2001 status post left carotid stent in February 2006      by Dr. Madilyn Fireman status post left carotid endarterectomy in October 2001 by      Dr. Hart Rochester.  3.  Hyperlipidemia.  4.  History of hepatitis B.  5.  History of rheumatic fever.  6.  History of tobacco abuse.  7.  History of total abdominal hysterectomy  with bilateral salpingo-      oophorectomy.  8.  Allergy to penicillin which causes hives and intolerance to morphine      which causes vomiting.  9.  Postoperative acute blood loss anemia.  10. Postoperative elevated amylase (to be re-evaluated prior to discharge).   PROCEDURE:  July 23, 2004, aorto-bi-femoral bypass graft using a 14 by 8 mm  Hemashield Dacron graft for proximal aortic endarterectomy by Dr. Josephina Gip.   BRIEF HISTORY:  [name] is a 65 year old Caucasian female well known to  [facility] as she has undergone previous carotid endarterectomy and carotid  stenting for extracranial cerebrovascular occlusive disease.  Over the past  several months, she has had increasing right hip claudication.  Her symptoms  are also present on the left to a much lesser extent.  The pain radiates  down to the lower extremities but she denies numbness or motor difficulties.  There is also no history  of tissue loss or non-healing ulcerations.  Claudication occurs at less than one block.  She has a known history of long-  standing tobacco use.  In April 2006, she underwent arteriography by Dr.  Hart Rochester.  An abdominal aortogram with bilateral lower extremity run off  revealed diffuse, severe, aorto-iliac occlusive disease with total occlusion  of the right iliac system and reconstitution of the right common femoral  artery and normal run off to the right.  Additionally, there was diffuse  left iliac occlusive disease with possible fibromuscular disease on the left  common iliac artery and normal run off on the left below the common femoral  level.  The patient's renal arteries and inferior mesenteric arteries were  patent.  Dr. Hart Rochester recommended the patient proceed with aorto-bi-femoral  bypass graft with relief of lifestyle limiting symptomatology.  After  discussing the risks and benefits, [name] agreed to proceed.   HOSPITAL COURSE:  On July 23, 2004, [name] was electively admitted to  [facility] and did  undergo aorto-bi-femoral bypass grafting by Dr.  Quita Skye. Hart Rochester.  She was extubated neurologically intact and transferred to  the surgical intensive care unit in stable condition with routine [facility] intra-  abdominal vascular surgery orders.  [name] had a relatively non-eventful  hospital course.  A nasogastric tube was placed interoperatively and  remained until postoperative day two.  Dilaudid PCA was used for pain  medication initially.  Postoperative ankle brachial indices were greater  than 1 bilaterally which were improved from preoperative.  On postoperative  day two, she was passing flatus but was yet to have a bowel movement.  She  was given daily Dulcolax suppositories until results occurred.  By  postoperative day three, she was having sips of clear liquids but with  slight nausea without vomiting.  Her abdominal exam that day showed slight  distention  and was nontender.  Her distal pulses were palpable bilaterally.  She did have a T-max of 101.2 which was felt most likely secondary to  atelectasis.  This resolved with mobilization and aggressive pulmonary  toilet.  Over the next several days, [name]'s diet was advanced.  She  had a good bowel movement  by postoperative day four and her abdomen showed  less distention and her nausea had resolved.  By postoperative day five, she  was advanced to a soft diet.  She was restarted on her home medications.  A  tobacco cessation consult was also requested and she was started on a  Nicoderm patch.  At this time, [name] was ambulating in the hallways.  She remained stable throughout her hospitalization.  Her fevers have now  resolved.  Her last vital signs showed blood pressure 92/57, heart rate in  the 70s to 80s and a sinus rhythm, temperature 97.5, oxygen saturation 93%  on 2 liters per nasal cannula.  Postoperative labs did show a decreased  hemoglobin and hematocrit of 7.8 and 23.3.  There was no obvious signs of  bleeding.  It was felt that if her repeat labs on July 29, 2004, showed  improvement in her hemoglobin and hematocrit, that she was tolerating a soft  diet, that she had weaned successfully from supplemental oxygen, that she  would be ready for discharge home on postoperative day six.  At that time,  her incisions remained clean and dry without signs of infection.  Her feet  remained well perfused with palpable distal pulses.  Her abdominal exam  remained soft and nontender with good bowel sounds.   Recent lab results showed white blood cell count 7.2, hemoglobin 7.8,  hematocrit 23.3, platelet count 209 (recheck on June 8 still pending on this  dictation).  Sodium 136, potassium 4, chloride 101, CO2 31, BUN 6,  creatinine 0.6, blood glucose 108, SGOT 22, SGPT 19, alkaline phos 47, total bilirubin 0.4, amylase 352, albumin 2.9.  Repeat amylase order for June 8  still pending  at the time of this dictation.   DISCHARGE MEDICATIONS:  1.  Lopressor 25 mg p.o. b.i.d.  2.  Aspirin 81 mg p.o. daily.  3.  Lipitor 40 mg p.o. daily.  4.  Spectrovite p.o. daily.  5.  Levoxyl 100 mcg p.o. daily.  6.  Plavix 75 mg p.o. daily.  7.  Citracal 1 daily.  8.  Calcium 1000 mg p.o. daily.  9.  Tylox 1-2 tablets p.o. q.4h. p.r.n. pain.  10. Nicoderm patch 21 mg daily, decrease to 14 mg patch in 4-6 weeks, then  decrease to 7 mg patch for two weeks.   DISCHARGE INSTRUCTIONS:  She is to follow a low fat, low salt diet.  She  should avoid driving or heavy lifting until re-evaluated by Dr. Hart Rochester.  She  should increase her activity slowly and daily walking exercises were  encouraged.  She may shower and clean her incisions gently with mild soap  and water.  She should notify the [facility] office if she develops fever greater  than 101, redness or drainage from her incision site, increasing abdominal  distention, abdominal pain, or nausea and vomiting.  She is to follow up  with Dr. Hart Rochester at the [facility] office on August 17, 2004, at 9 a.m.  At her  appointment with Dr. Hart Rochester, she will have a postoperative lower extremity  Doppler study and ABIs.  She is to have a nurses visit at [facility] on August 06, 2004, at 10 a.m. for staple removal.      AWZ/MEDQ  D:  07/28/2004  T:  07/28/2004  Job:  [id number]   cc:   Delaney Meigs, M.D.  [address]  [place]  [place] [address]  Fax: [phone]

## 2011-06-14 ENCOUNTER — Encounter: Payer: Self-pay | Admitting: Neurosurgery

## 2011-06-23 ENCOUNTER — Ambulatory Visit: Payer: Medicare PPO | Admitting: Neurosurgery

## 2011-06-23 ENCOUNTER — Other Ambulatory Visit: Payer: Medicare PPO

## 2011-06-23 ENCOUNTER — Encounter: Payer: Self-pay | Admitting: Neurosurgery

## 2011-06-27 ENCOUNTER — Ambulatory Visit (INDEPENDENT_AMBULATORY_CARE_PROVIDER_SITE_OTHER): Payer: Medicare PPO | Admitting: *Deleted

## 2011-06-27 ENCOUNTER — Encounter: Payer: Self-pay | Admitting: Neurosurgery

## 2011-06-27 ENCOUNTER — Ambulatory Visit (INDEPENDENT_AMBULATORY_CARE_PROVIDER_SITE_OTHER): Payer: Medicare PPO | Admitting: Neurosurgery

## 2011-06-27 VITALS — BP 137/78 | HR 59 | Resp 58 | Ht 60.0 in | Wt 142.3 lb

## 2011-06-27 DIAGNOSIS — Z48812 Encounter for surgical aftercare following surgery on the circulatory system: Secondary | ICD-10-CM

## 2011-06-27 DIAGNOSIS — I739 Peripheral vascular disease, unspecified: Secondary | ICD-10-CM

## 2011-06-27 DIAGNOSIS — I6529 Occlusion and stenosis of unspecified carotid artery: Secondary | ICD-10-CM

## 2011-06-27 NOTE — Progress Notes (Addendum)
[facility] [place] HISTORY AND PHYSICAL   CC: 66-year-old patient of Dr. Hart Rochester seen for followup for six-month carotid duplex Referring Physician: Hart Rochester  History of Present Illness: 66 year old female patient of Dr. Hart Rochester and underwent a left carotid endarterectomy in October 2001 with stenting in February 2006 she also underwent a aortobifemoral bypass graft in June 2006. Patient reports no new medical difficulties at this time her no recent surgeries. She also denies signs or symptoms of CVA, TIA, diplopia, dysphasia, amaurosis fugax or word finding difficulty. Patient also reports symptoms claudication with ambulation.  Past Medical History  Diagnosis Date  . Hyperlipidemia   . Thyroid disease     Hypothyroidism  . Rheumatic fever   . Hepatitis   . Cerebrovascular occlusion 2001    Extracranial with Hx of TIA's  . Myocardial infarction   . Carotid artery occlusion 05/08/2007    ROS: [x]  Positive   [ ]  Denies    General: [ ]  Weight loss, [ ]  Fever, [ ]  chills Neurologic: [ ]  Dizziness, [ ]  Blackouts, [ ]  Seizure [ ]  Stroke, [ ]  "Mini stroke", [ ]  Slurred speech, [ ]  Temporary blindness; [ ]  weakness in arms or legs, [ ]  Hoarseness Cardiac: [ ]  Chest pain/pressure, [ ]  Shortness of breath at rest [ ]  Shortness of breath with exertion, [ ]  Atrial fibrillation or irregular heartbeat Vascular: [x ] Pain in legs with walking, [ ]  Pain in legs at rest, [ ]  Pain in legs at night,  [ ]  Non-healing ulcer, [ ]  Blood clot in vein/DVT,   Pulmonary: [ ]  Home oxygen, [ ]  Productive cough, [ ]  Coughing up blood, [ ]  Asthma,  [ ]  Wheezing Musculoskeletal:  [ ]  Arthritis, [ ]  Low back pain, [ ]  Joint pain Hematologic: [ ]  Easy Bruising, [ ]  Anemia; [ ]  Hepatitis Gastrointestinal: [ ]  Blood in stool, [ ]  Gastroesophageal Reflux/heartburn, [ ]  Trouble swallowing Urinary: [ ]  chronic Kidney disease, [ ]  on HD - [ ]  MWF or [ ]  TTHS, [ ]  Burning with urination, [ ]  Difficulty  urinating Skin: [ ]  Rashes, [ ]  Wounds Psychological: [ ]  Anxiety, [ ]  Depression   Social History History  Substance Use Topics  . Smoking status: Current Everyday Smoker -- 0.2 packs/day for 40 years    Types: Cigarettes  . Smokeless tobacco: Not on file  . Alcohol Use: No    Family History Family History  Problem Relation Age of Onset  . Cancer Sister   . Hypertension Father     Allergies  Allergen Reactions  . Morphine And Related   . Penicillins     Current Outpatient Prescriptions  Medication Sig Dispense Refill  . aspirin 81 MG tablet Take 81 mg by mouth daily.      . budesonide-formoterol (SYMBICORT) 80-4.5 MCG/ACT inhaler Inhale 2 puffs into the lungs 2 (two) times daily.      . Cyanocobalamin (VITAMIN B 12 PO) Take by mouth.      . fish oil-omega-3 fatty acids 1000 MG capsule Take 1,000 mg by mouth daily.      Marland Kitchen HYDROcodone-acetaminophen (VICODIN) 5-500 MG per tablet Take by mouth at bedtime as needed and may repeat dose one time if needed. Half tab hs      . levothyroxine (LEVOXYL) 100 MCG tablet Take 100 mcg by mouth daily.      Marland Kitchen LORazepam (ATIVAN) 0.5 MG tablet Take 0.5 mg by mouth at bedtime. Take three tabs      .  Multiple Vitamin (MULTIVITAMIN) tablet Take 1 tablet by mouth daily.      . nebivolol (BYSTOLIC) 5 MG tablet Take 5 mg by mouth daily.      . rosuvastatin (CRESTOR) 20 MG tablet Take 20 mg by mouth daily.        Physical Examination  Filed Vitals:   06/27/11 0948  BP: 137/78  Pulse: 59  Resp: 58    Body mass index is 27.79 kg/(m^2).  General:  WDWN in NAD Gait: Normal HEENT: WNL Eyes: Pupils equal Pulmonary: normal non-labored breathing , without Rales, rhonchi,  wheezing Cardiac: RRR, without  Murmurs, rubs or gallops; Abdomen: soft, NT, no masses Skin: no rashes, ulcers noted  Vascular Exam Pulses: Patient has 2+ radial pulses bilaterally with palpable DP and PT pulses Carotid bruits: Mild carotid bruits are heard  bilaterally Extremities without ischemic changes, no Gangrene , no cellulitis; no open wounds;  Musculoskeletal: no muscle wasting or atrophy   Neurologic: A&O X 3; Appropriate Affect ; SENSATION: normal; MOTOR FUNCTION:  moving all extremities equally. Speech is fluent/normal  Non-Invasive Vascular Imaging CAROTID DUPLEX 06/27/2011  Right ICA 60 - 79 % stenosis Left ICA 0 - 19% stenosis   ASSESSMENT/PLAN: Patient with a patent left CEA, right stenosis in the 60s 79% range which is unchanged from last year. Her bypass graft has not been looked at since 2011 we will repeat her ABIs when she returns in 6 months for repeat carotid duplex. Her questions were encouraged and answered  Lauree Chandler ANP   Clinic MD: Hart Rochester

## 2011-06-28 NOTE — Progress Notes (Signed)
Addended by: Sharee Pimple on: 06/28/2011 09:31 AM   Modules accepted: Orders

## 2011-06-29 NOTE — Procedures (Unsigned)
CAROTID DUPLEX EXAM  INDICATION:  Follow up left CCA stent and left carotid endarterectomy  HISTORY: Diabetes:  No Cardiac:  No Hypertension:  No Smoking:  Yes Previous Surgery:  Left CEA 11/25/1999, left CCA stent 03/24/2004 CV History:  Currently asymptomatic Amaurosis Fugax No, Paresthesias No, Hemiparesis No                                      RIGHT             LEFT Brachial systolic pressure:         128               130 Brachial Doppler waveforms:         Normal            Normal Vertebral direction of flow:        Antegrade         Antegrade DUPLEX VELOCITIES (cm/sec) CCA peak systolic                   68                219 (distal stent) ECA peak systolic                   158               Not visualized ICA peak systolic                   201               86 ICA end diastolic                   67                33 PLAQUE MORPHOLOGY:                  Heterogeneous     Heterogeneous PLAQUE AMOUNT:                      Moderate          Minimal/moderate PLAQUE LOCATION:                    ICA               Stent  IMPRESSION: 1. Right internal carotid artery velocity suggests 60%-79% stenosis. 2. Elevated velocities of the left distal stent with obstruction     observed.  Grading of the left internal carotid artery disease is     difficult due to stent disease.  However, carotid endarterectomy     appears patent without any evidence of restenosis. 3. Antegrade vertebral arteries bilaterally.  ___________________________________________ [name], M.D.  EM/MEDQ  D:  06/27/2011  T:  06/27/2011  Job:  [id number]

## 2011-12-30 ENCOUNTER — Encounter: Payer: Self-pay | Admitting: Neurosurgery

## 2012-01-02 ENCOUNTER — Encounter: Payer: Self-pay | Admitting: Neurosurgery

## 2012-01-02 ENCOUNTER — Ambulatory Visit (INDEPENDENT_AMBULATORY_CARE_PROVIDER_SITE_OTHER): Payer: Medicare PPO | Admitting: Neurosurgery

## 2012-01-02 ENCOUNTER — Other Ambulatory Visit (INDEPENDENT_AMBULATORY_CARE_PROVIDER_SITE_OTHER): Payer: Medicare PPO | Admitting: *Deleted

## 2012-01-02 ENCOUNTER — Encounter (INDEPENDENT_AMBULATORY_CARE_PROVIDER_SITE_OTHER): Payer: Medicare PPO | Admitting: *Deleted

## 2012-01-02 VITALS — BP 158/73 | HR 48 | Resp 14 | Ht 60.0 in | Wt 136.4 lb

## 2012-01-02 DIAGNOSIS — Z48812 Encounter for surgical aftercare following surgery on the circulatory system: Secondary | ICD-10-CM

## 2012-01-02 DIAGNOSIS — I6529 Occlusion and stenosis of unspecified carotid artery: Secondary | ICD-10-CM

## 2012-01-02 DIAGNOSIS — I739 Peripheral vascular disease, unspecified: Secondary | ICD-10-CM

## 2012-01-02 DIAGNOSIS — M79609 Pain in unspecified limb: Secondary | ICD-10-CM

## 2012-01-02 NOTE — Progress Notes (Signed)
[facility]  Carotid Office Note  CC: Carotid surveillance Referring Physician: Hart Rochester  History of Present Illness: 66 year old female patient of Dr. Hart Rochester status post left CEA in 2001 with stenting in 2006. The patient denies any signs or symptoms of CVA, TIA, amaurosis fugax or any neural deficit. The patient denies any new medical diagnoses or recent surgery.  Past Medical History  Diagnosis Date  . Hyperlipidemia   . Thyroid disease     Hypothyroidism  . Rheumatic fever   . Hepatitis   . Cerebrovascular occlusion 2001    Extracranial with Hx of TIA's  . Myocardial infarction   . Carotid artery occlusion 05/08/2007    ROS: [x]  Positive   [ ]  Denies    General: [ ]  Weight loss, [ ]  Fever, [ ]  chills Neurologic: [ ]  Dizziness, [ ]  Blackouts, [ ]  Seizure [ ]  Stroke, [ ]  "Mini stroke", [ ]  Slurred speech, [ ]  Temporary blindness; [ ]  weakness in arms or legs, [ ]  Hoarseness Cardiac: [ ]  Chest pain/pressure, [ ]  Shortness of breath at rest [ ]  Shortness of breath with exertion, [ ]  Atrial fibrillation or irregular heartbeat Vascular: [ ]  Pain in legs with walking, [ ]  Pain in legs at rest, [ ]  Pain in legs at night,  [ ]  Non-healing ulcer, [ ]  Blood clot in vein/DVT,   Pulmonary: [ ]  Home oxygen, [ ]  Productive cough, [ ]  Coughing up blood, [ ]  Asthma,  [ ]  Wheezing Musculoskeletal:  [ ]  Arthritis, [ ]  Low back pain, [ ]  Joint pain Hematologic: [ ]  Easy Bruising, [ ]  Anemia; [ ]  Hepatitis Gastrointestinal: [ ]  Blood in stool, [ ]  Gastroesophageal Reflux/heartburn, [ ]  Trouble swallowing Urinary: [ ]  chronic Kidney disease, [ ]  on HD - [ ]  MWF or [ ]  TTHS, [ ]  Burning with urination, [ ]  Difficulty urinating Skin: [ ]  Rashes, [ ]  Wounds Psychological: [ ]  Anxiety, [ ]  Depression   Social History History  Substance Use Topics  . Smoking status: Current Every Day Smoker -- 0.2 packs/day for 40 years    Types: Cigarettes  . Smokeless tobacco: Not on  file  . Alcohol Use: No    Family History Family History  Problem Relation Age of Onset  . Cancer Sister   . Hypertension Father   . Heart attack Mother   . Kidney disease Brother   . Cancer Brother   . Cancer Brother     Lung     Allergies  Allergen Reactions  . Penicillins Swelling  . Morphine And Related Nausea And Vomiting    Current Outpatient Prescriptions  Medication Sig Dispense Refill  . aspirin 81 MG tablet Take 81 mg by mouth daily.      . budesonide-formoterol (SYMBICORT) 80-4.5 MCG/ACT inhaler Inhale 2 puffs into the lungs 2 (two) times daily.      . Cyanocobalamin (VITAMIN B 12 PO) Take by mouth.      . fish oil-omega-3 fatty acids 1000 MG capsule Take 1,000 mg by mouth daily.      Marland Kitchen HYDROcodone-acetaminophen (VICODIN) 5-500 MG per tablet Take by mouth at bedtime as needed and may repeat dose one time if needed. Half tab hs      . levothyroxine (LEVOXYL) 100 MCG tablet Take 100 mcg by mouth daily.      Marland Kitchen LORazepam (ATIVAN) 0.5 MG tablet Take 0.5 mg by mouth at bedtime. Take three tabs      . Multiple  Vitamin (MULTIVITAMIN) tablet Take 1 tablet by mouth daily.      . nebivolol (BYSTOLIC) 5 MG tablet Take 5 mg by mouth daily.      . rosuvastatin (CRESTOR) 20 MG tablet Take 20 mg by mouth daily.        Physical Examination  Filed Vitals:   01/02/12 1031  BP: 158/73  Pulse: 48  Resp:     Body mass index is 26.64 kg/(m^2).  General:  WDWN in NAD Gait: Normal HEENT: WNL Eyes: Pupils equal Pulmonary: normal non-labored breathing , without Rales, rhonchi,  wheezing Cardiac: RRR, without  Murmurs, rubs or gallops; Abdomen: soft, NT, no masses Skin: no rashes, ulcers noted  Vascular Exam Pulses: 3+ radial pulses bilaterally Carotid bruits: Dampened carotid pulses bilaterally no bruits are heard Extremities without ischemic changes, no Gangrene , no cellulitis; no open wounds;  Musculoskeletal: no muscle wasting or atrophy   Neurologic: A&O X 3;  Appropriate Affect ; SENSATION: normal; MOTOR FUNCTION:  moving all extremities equally. Speech is fluent/normal  Non-Invasive Vascular Imaging CAROTID DUPLEX 01/02/2012  Right ICA 60 - 79 % stenosis Left ICA 40 - 59 % stenosis Increased velocities noted bilaterally, the right ICA stenosis has not changed since previous exam in May of 2013 and now there is noted 50-75% left distal stent stenosis. This will be reviewed by Dr. Hart Rochester.  ASSESSMENT/PLAN: Asymptomatic patient with increased velocities and stenosis bilaterally in the ICA. The patient will followup in 6 months with repeat carotid duplex, her questions were encouraged and answered, she is in agreement with this plan. I will review these findings with Dr. Hart Rochester and he determines patient needs intervention sooner than that time our office will contact the patient and she understands this.  Lauree Chandler ANP  Clinic M.D.: Hart Rochester

## 2012-01-04 NOTE — Addendum Note (Signed)
Addended by: Sharee Pimple on: 01/04/2012 11:14 AM   Modules accepted: Orders

## 2012-07-02 ENCOUNTER — Other Ambulatory Visit: Payer: Medicare PPO

## 2012-07-02 ENCOUNTER — Ambulatory Visit: Payer: Medicare PPO | Admitting: Neurosurgery

## 2012-07-30 ENCOUNTER — Other Ambulatory Visit (INDEPENDENT_AMBULATORY_CARE_PROVIDER_SITE_OTHER): Payer: Medicare HMO | Admitting: *Deleted

## 2012-07-30 DIAGNOSIS — I6529 Occlusion and stenosis of unspecified carotid artery: Secondary | ICD-10-CM

## 2012-07-30 DIAGNOSIS — Z48812 Encounter for surgical aftercare following surgery on the circulatory system: Secondary | ICD-10-CM

## 2012-07-31 ENCOUNTER — Encounter: Payer: Self-pay | Admitting: Surgery

## 2012-07-31 ENCOUNTER — Other Ambulatory Visit: Payer: Self-pay | Admitting: *Deleted

## 2012-07-31 DIAGNOSIS — I739 Peripheral vascular disease, unspecified: Secondary | ICD-10-CM

## 2012-07-31 DIAGNOSIS — Z48812 Encounter for surgical aftercare following surgery on the circulatory system: Secondary | ICD-10-CM

## 2012-10-26 ENCOUNTER — Other Ambulatory Visit (HOSPITAL_COMMUNITY): Payer: Self-pay | Admitting: Family Medicine

## 2012-10-26 DIAGNOSIS — R1011 Right upper quadrant pain: Secondary | ICD-10-CM

## 2012-10-30 ENCOUNTER — Ambulatory Visit (HOSPITAL_COMMUNITY)
Admission: RE | Admit: 2012-10-30 | Discharge: 2012-10-30 | Disposition: A | Discharge status: Home / Self Care | Payer: Medicare HMO | Source: Ambulatory Visit | Attending: Family Medicine | Admitting: Family Medicine

## 2012-10-30 DIAGNOSIS — R1011 Right upper quadrant pain: Secondary | ICD-10-CM

## 2012-10-30 DIAGNOSIS — R197 Diarrhea, unspecified: Secondary | ICD-10-CM | POA: Insufficient documentation

## 2012-10-30 MED ORDER — IOHEXOL 300 MG/ML  SOLN
100.0000 mL | Freq: Once | INTRAMUSCULAR | Status: AC | PRN
  Administered 2012-10-30: 100 mL via INTRAVENOUS

## 2012-11-07 ENCOUNTER — Encounter (INDEPENDENT_AMBULATORY_CARE_PROVIDER_SITE_OTHER): Payer: Self-pay | Admitting: *Deleted

## 2012-12-11 ENCOUNTER — Ambulatory Visit (INDEPENDENT_AMBULATORY_CARE_PROVIDER_SITE_OTHER): Payer: Medicare HMO | Admitting: Internal Medicine

## 2013-01-02 ENCOUNTER — Other Ambulatory Visit: Payer: Self-pay | Admitting: Surgery

## 2013-01-02 DIAGNOSIS — I739 Peripheral vascular disease, unspecified: Secondary | ICD-10-CM

## 2013-01-02 DIAGNOSIS — Z48812 Encounter for surgical aftercare following surgery on the circulatory system: Secondary | ICD-10-CM

## 2013-01-29 ENCOUNTER — Encounter (HOSPITAL_COMMUNITY): Payer: Medicare PPO

## 2013-01-29 ENCOUNTER — Ambulatory Visit: Payer: Medicare PPO | Admitting: Family

## 2013-01-29 ENCOUNTER — Encounter: Payer: Self-pay | Admitting: Family

## 2013-01-29 ENCOUNTER — Other Ambulatory Visit (HOSPITAL_COMMUNITY): Payer: Medicare PPO

## 2013-01-30 ENCOUNTER — Ambulatory Visit (HOSPITAL_COMMUNITY)
Admission: RE | Admit: 2013-01-30 | Discharge: 2013-01-30 | Disposition: A | Discharge status: Home / Self Care | Payer: Medicare PPO | Source: Ambulatory Visit | Attending: Family | Admitting: Family

## 2013-01-30 ENCOUNTER — Encounter: Payer: Self-pay | Admitting: Family

## 2013-01-30 ENCOUNTER — Ambulatory Visit (INDEPENDENT_AMBULATORY_CARE_PROVIDER_SITE_OTHER)
Admission: RE | Admit: 2013-01-30 | Discharge: 2013-01-30 | Disposition: A | Discharge status: Home / Self Care | Payer: Medicare PPO | Source: Ambulatory Visit | Attending: Family | Admitting: Family

## 2013-01-30 ENCOUNTER — Encounter (INDEPENDENT_AMBULATORY_CARE_PROVIDER_SITE_OTHER): Payer: Self-pay

## 2013-01-30 ENCOUNTER — Ambulatory Visit (INDEPENDENT_AMBULATORY_CARE_PROVIDER_SITE_OTHER): Payer: Medicare PPO | Admitting: Family

## 2013-01-30 DIAGNOSIS — I6529 Occlusion and stenosis of unspecified carotid artery: Secondary | ICD-10-CM

## 2013-01-30 DIAGNOSIS — I658 Occlusion and stenosis of other precerebral arteries: Secondary | ICD-10-CM | POA: Insufficient documentation

## 2013-01-30 DIAGNOSIS — Z48812 Encounter for surgical aftercare following surgery on the circulatory system: Secondary | ICD-10-CM

## 2013-01-30 DIAGNOSIS — I739 Peripheral vascular disease, unspecified: Secondary | ICD-10-CM

## 2013-01-30 DIAGNOSIS — M79609 Pain in unspecified limb: Secondary | ICD-10-CM

## 2013-01-30 NOTE — Patient Instructions (Signed)
Stroke Prevention Some medical conditions and behaviors are associated with an increased chance of having a stroke. You may prevent a stroke by making healthy choices and managing medical conditions. Reduce your risk of having a stroke by:  Staying physically active. Get at least 30 minutes of activity on most or all days.  Not smoking. It may also be helpful to avoid exposure to secondhand smoke.  Limiting alcohol use. Moderate alcohol use is considered to be:  No more than 2 drinks per day for men.  No more than 1 drink per day for nonpregnant women.  Eating healthy foods.  Include 5 or more servings of fruits and vegetables a day.  Certain diets may be prescribed to address high blood pressure, high cholesterol, diabetes, or obesity.  Managing your cholesterol levels.  A low-saturated fat, low-trans fat, low-cholesterol, and high-fiber diet may control cholesterol levels.  Take any prescribed medicines to control cholesterol as directed by your caregiver.  Managing your diabetes.  A controlled-carbohydrate, controlled-sugar diet is recommended to manage diabetes.  Take any prescribed medicines to control diabetes as directed by your caregiver.  Controlling your high blood pressure (hypertension).  A low-salt (sodium), low-saturated fat, low-trans fat, and low-cholesterol diet is recommended to manage high blood pressure.  Take any prescribed medicines to control hypertension as directed by your caregiver.  Maintaining a healthy weight.  A reduced-calorie, low-sodium, low-saturated fat, low-trans fat, low-cholesterol diet is recommended to manage weight.  Stopping drug abuse.  Avoiding birth control pills.  Talk to your caregiver about the risks of taking birth control pills if you are over 35 years old, smoke, get migraines, or have ever had a blood clot.  Getting evaluated for sleep disorders (sleep apnea).  Talk to your caregiver about getting a sleep evaluation  if you snore a lot or have excessive sleepiness.  Taking medicines as directed by your caregiver.  For some people, aspirin or blood thinners (anticoagulants) are helpful in reducing the risk of forming abnormal blood clots that can lead to stroke. If you have the irregular heart rhythm of atrial fibrillation, you should be on a blood thinner unless there is a good reason you cannot take them.  Understand all your medicine instructions. SEEK IMMEDIATE MEDICAL CARE IF:   You have sudden weakness or numbness of the face, arm, or leg, especially on one side of the body.  You have sudden confusion.  You have trouble speaking (aphasia) or understanding.  You have sudden trouble seeing in one or both eyes.  You have sudden trouble walking.  You have dizziness.  You have a loss of balance or coordination.  You have a sudden, severe headache with no known cause.  You have new chest pain or an irregular heartbeat. Any of these symptoms may represent a serious problem that is an emergency. Do not wait to see if the symptoms will go away. Get medical help right away. Call your local emergency services (911 in [place] Do not drive yourself to the hospital. Document Released: 03/17/2004 Document Revised: 05/02/2011 Document Reviewed: 08/10/2012 ExitCare Patient Information 2014 [employer].  Smoking Cessation Quitting smoking is important to your health and has many advantages. However, it is not always easy to quit since nicotine is a very addictive drug. Often times, people try 3 times or more before being able to quit. This document explains the best ways for you to prepare to quit smoking. Quitting takes hard work and a lot of effort, but you can do it.   ADVANTAGES OF QUITTING SMOKING  You will live longer, feel better, and live better.  Your body will feel the impact of quitting smoking almost immediately.  Within 20 minutes, blood pressure decreases. Your pulse returns to its normal  level.  After 8 hours, carbon monoxide levels in the blood return to normal. Your oxygen level increases.  After 24 hours, the chance of having a heart attack starts to decrease. Your breath, hair, and body stop smelling like smoke.  After 48 hours, damaged nerve endings begin to recover. Your sense of taste and smell improve.  After 72 hours, the body is virtually free of nicotine. Your bronchial tubes relax and breathing becomes easier.  After 2 to 12 weeks, lungs can hold more air. Exercise becomes easier and circulation improves.  The risk of having a heart attack, stroke, cancer, or lung disease is greatly reduced.  After 1 year, the risk of coronary heart disease is cut in half.  After 5 years, the risk of stroke falls to the same as a nonsmoker.  After 10 years, the risk of lung cancer is cut in half and the risk of other cancers decreases significantly.  After 15 years, the risk of coronary heart disease drops, usually to the level of a nonsmoker.  If you are pregnant, quitting smoking will improve your chances of having a healthy baby.  The people you live with, especially any children, will be healthier.  You will have extra money to spend on things other than cigarettes. QUESTIONS TO THINK ABOUT BEFORE ATTEMPTING TO QUIT You may want to talk about your answers with your caregiver.  Why do you want to quit?  If you tried to quit in the past, what helped and what did not?  What will be the most difficult situations for you after you quit? How will you plan to handle them?  Who can help you through the tough times? Your family? Friends? A caregiver?  What pleasures do you get from smoking? What ways can you still get pleasure if you quit? Here are some questions to ask your caregiver:  How can you help me to be successful at quitting?  What medicine do you think would be best for me and how should I take it?  What should I do if I need more help?  What is  smoking withdrawal like? How can I get information on withdrawal? GET READY  Set a quit date.  Change your environment by getting rid of all cigarettes, ashtrays, matches, and lighters in your home, car, or work. Do not let people smoke in your home.  Review your past attempts to quit. Think about what worked and what did not. GET SUPPORT AND ENCOURAGEMENT You have a better chance of being successful if you have help. You can get support in many ways.  Tell your family, friends, and co-workers that you are going to quit and need their support. Ask them not to smoke around you.  Get individual, group, or telephone counseling and support. Programs are available at local hospitals and health centers. Call your local health department for information about programs in your area.  Spiritual beliefs and practices may help some smokers quit.  Download a "quit meter" on your computer to keep track of quit statistics, such as how long you have gone without smoking, cigarettes not smoked, and money saved.  Get a self-help book about quitting smoking and staying off of tobacco. LEARN NEW SKILLS AND BEHAVIORS  Distract yourself from   urges to smoke. Talk to someone, go for a walk, or occupy your time with a task.  Change your normal routine. Take a different route to work. Drink tea instead of coffee. Eat breakfast in a different place.  Reduce your stress. Take a hot bath, exercise, or read a book.  Plan something enjoyable to do every day. Reward yourself for not smoking.  Explore interactive web-based programs that specialize in helping you quit. GET MEDICINE AND USE IT CORRECTLY Medicines can help you stop smoking and decrease the urge to smoke. Combining medicine with the above behavioral methods and support can greatly increase your chances of successfully quitting smoking.  Nicotine replacement therapy helps deliver nicotine to your body without the negative effects and risks of smoking.  Nicotine replacement therapy includes nicotine gum, lozenges, inhalers, nasal sprays, and skin patches. Some may be available over-the-counter and others require a prescription.  Antidepressant medicine helps people abstain from smoking, but how this works is unknown. This medicine is available by prescription.  Nicotinic receptor partial agonist medicine simulates the effect of nicotine in your brain. This medicine is available by prescription. Ask your caregiver for advice about which medicines to use and how to use them based on your health history. Your caregiver will tell you what side effects to look out for if you choose to be on a medicine or therapy. Carefully read the information on the package. Do not use any other product containing nicotine while using a nicotine replacement product.  RELAPSE OR DIFFICULT SITUATIONS Most relapses occur within the first 3 months after quitting. Do not be discouraged if you start smoking again. Remember, most people try several times before finally quitting. You may have symptoms of withdrawal because your body is used to nicotine. You may crave cigarettes, be irritable, feel very hungry, cough often, get headaches, or have difficulty concentrating. The withdrawal symptoms are only temporary. They are strongest when you first quit, but they will go away within 10 14 days. To reduce the chances of relapse, try to:  Avoid drinking alcohol. Drinking lowers your chances of successfully quitting.  Reduce the amount of caffeine you consume. Once you quit smoking, the amount of caffeine in your body increases and can give you symptoms, such as a rapid heartbeat, sweating, and anxiety.  Avoid smokers because they can make you want to smoke.  Do not let weight gain distract you. Many smokers will gain weight when they quit, usually less than 10 pounds. Eat a healthy diet and stay active. You can always lose the weight gained after you quit.  Find ways to improve  your mood other than smoking. FOR MORE INFORMATION  www.smokefree.gov  Document Released: 02/01/2001 Document Revised: 08/09/2011 Document Reviewed: 05/19/2011 ExitCare Patient Information 2014 [employer].  

## 2013-01-30 NOTE — Progress Notes (Signed)
Established Carotid Patient  History of Present Illness  [name] is a 67 y.o. female patient of Dr. Hart [place] status post left CEA in 2001 with left CCA stent in 2006.  She returns today for follow up of carotid artery stenosis and lower extremity ABI's.  Patient has Positive history of TIA or stroke symptom about 10 years ago, no stroke activity since then other than possibly intermittent headaches and dizziness in the last couple of months.  The patient denies amaurosis fugax or monocular blindness.  The patient  reports right facial drooping that remains form her stoke 10 years ago.  Pt. denies reports right arm mild weakness.  The patient denies receptive or expressive aphasia.   She is undergoing a considerable amount of stress.  Pt Diabetic: No Pt smoker: smoker  (2 1/2 packs/week many yrs)  Pt meds include: Statin : Yes Betablocker: Yes ASA: Yes Other anticoagulants/antiplatelets: no   Past Medical History  Diagnosis Date  . Hyperlipidemia   . Thyroid disease     Hypothyroidism  . Rheumatic fever   . Hepatitis   . Cerebrovascular occlusion 2001    Extracranial with Hx of TIA's  . Myocardial infarction   . Carotid artery occlusion 05/08/2007  . Stroke     Social History History  Substance Use Topics  . Smoking status: Current Every Day Smoker -- 0.25 packs/day for 40 years    Types: Cigarettes  . Smokeless tobacco: Never Used  . Alcohol Use: No    Family History Family History  Problem Relation Age of Onset  . Cancer Sister   . Hypertension Father   . Heart attack Mother   . Hyperlipidemia Mother   . Kidney disease Brother   . Cancer Brother   . Cancer Brother     Lung     Surgical History Past Surgical History  Procedure Laterality Date  . Pr vein bypass graft,aorto-fem-pop  2001  . Carotid endarterectomy  11/25/1999    S/P Left  . Carotid endarterectomy  03/24/2004    Left CCA stent- Dr. Madilyn Fireman  . Carotid stent  2006    Allergies  Allergen  Reactions  . Penicillins Swelling  . Morphine And Related Nausea And Vomiting    Current Outpatient Prescriptions  Medication Sig Dispense Refill  . aspirin 81 MG tablet Take 81 mg by mouth daily.      . budesonide-formoterol (SYMBICORT) 80-4.5 MCG/ACT inhaler Inhale 2 puffs into the lungs 2 (two) times daily.      . calcium citrate (CALCITRATE - DOSED IN MG ELEMENTAL CALCIUM) 950 MG tablet Take 200 mg of elemental calcium by mouth daily.      . Cyanocobalamin (VITAMIN B 12 PO) Take by mouth.      . fish oil-omega-3 fatty acids 1000 MG capsule Take 1,000 mg by mouth daily.      Marland Kitchen HYDROcodone-acetaminophen (VICODIN) 5-500 MG per tablet Take by mouth at bedtime as needed and may repeat dose one time if needed. Half tab hs      . levothyroxine (LEVOXYL) 100 MCG tablet Take 125 mcg by mouth daily.       Marland Kitchen LORazepam (ATIVAN) 0.5 MG tablet Take 0.5 mg by mouth at bedtime. Take three tabs      . Multiple Vitamin (MULTIVITAMIN) tablet Take 1 tablet by mouth daily.      . nebivolol (BYSTOLIC) 5 MG tablet Take 10 mg by mouth daily.       . rosuvastatin (CRESTOR) 20 MG tablet  Take 20 mg by mouth daily.       No current facility-administered medications for this visit.    Physical Examination  Filed Vitals:   01/30/13 1553  BP: 127/72  Pulse: 52  Resp: 14   Filed Weights   01/30/13 1553  Weight: 133 lb (60.328 kg)   Body mass index is 25.97 kg/(m^2).  General: WDWN female in NAD GAIT: normal Eyes: PERRLA Pulmonary:  CTAB with decreased air movement in all fields , Negative  Rales, Negative rhonchi, & Negative wheezing.  Cardiac: regular Rhythm ,  Negative Murmurs.  VASCULAR EXAM Carotid Bruits Left Right   Positive, harsh Positive, moderate     Radial pulses are 2+ palpable and  equal.                                                                                                                            LE Pulses LEFT RIGHT       POPLITEAL  not palpable   not palpable     Gastrointestinal: soft, nontender, BS WNL, no r/g,  negative masses.  Musculoskeletal: Negative muscle atrophy/wasting. M/S 4/5 throughout, Extremities without ischemic changes.  Neurologic: A&O X 3; Appropriate Affect ; SENSATION ;normal;  Speech is normal CN 2-12 intact except right facial droop is present and mild tongue deviation to the right, Pain and light touch intact in extremities, Motor exam as listed above.   Non-Invasive Vascular Imaging CAROTID DUPLEX 01/30/2013   Right ICA: 60 - 79 % stenosis. Left ICA: 40 - 59 % stenosis.  Previous carotid studies demonstrated: RICA 40 - 59 % stenosis, LICA with patent CEA site and mild hyperplasia at the proximal patch site.  These findings are Worse in both ICA's from previous exam on 07/30/12.  ABI's ( 01/30/2013): Right: 1.10, Left: 1.12, both LE's with tri and biphasic waveforms.  Assessment: [name] is a 67 y.o. female who presents with asymptomatic 60 - 79 % Right ICA  Stenosis and 40-59% left ICA stenosis. The  ICA stenosis is  Worse in both ICA's from previous exam 6 months prior. ABI's are normal, no evidence of arterial occlusive disease.  Plan: Follow-up in 6 months with Carotid Duplex scan.   I discussed in depth with the patient the nature of atherosclerosis, and emphasized the importance of maximal medical management including strict control of blood pressure, blood glucose, and lipid levels, obtaining regular exercise, and cessation of smoking.  The patient is aware that without maximal medical management the underlying atherosclerotic disease process will progress, limiting the benefit of any interventions.  The patient was counseled re smoking cessation. The patient was given information about stroke prevention and what symptoms should prompt the patient to seek immediate medical care. Thank you for allowing [place] to participate in this patient's care.  Charisse March, RN, MSN, FNP-C [facility] Office: [phone]  Clinic Physician: Edilia Bo  01/30/2013 4:33 PM

## 2013-01-31 ENCOUNTER — Other Ambulatory Visit: Payer: Self-pay | Admitting: *Deleted

## 2013-01-31 ENCOUNTER — Telehealth: Payer: Self-pay | Admitting: Family

## 2013-01-31 NOTE — Telephone Encounter (Addendum)
Message copied by Fredrich Birks on Thu Jan 31, 2013  2:34 PM ------      Message from: Lorin Mercy K      Created: Thu Jan 31, 2013  9:17 AM      Regarding: schedule                   ----- Message -----         From: Annye Rusk, NP         Sent: 01/30/2013   6:39 PM           To: Sharee Pimple, CMA, [facility]            I spoke with patient today and she is expecting a call to schedule her to see me in 6 months.            Scheduling- Please call patient to schedule her to see me in 6 months.            Joyce Gross - Please enter order for carotid Duplex for 6 months, the day she sees me.            Thank you,            Rosalita Chessman ------  01/31/13: spoke with patient,dpm

## 2013-02-22 ENCOUNTER — Telehealth: Payer: Self-pay | Admitting: *Deleted

## 2013-02-22 NOTE — Telephone Encounter (Signed)
Error

## 2013-03-28 ENCOUNTER — Encounter (INDEPENDENT_AMBULATORY_CARE_PROVIDER_SITE_OTHER): Payer: Self-pay | Admitting: *Deleted

## 2013-04-09 ENCOUNTER — Ambulatory Visit (INDEPENDENT_AMBULATORY_CARE_PROVIDER_SITE_OTHER): Payer: Commercial Managed Care - HMO | Admitting: Internal Medicine

## 2013-04-16 ENCOUNTER — Ambulatory Visit (INDEPENDENT_AMBULATORY_CARE_PROVIDER_SITE_OTHER): Payer: Medicare PPO | Admitting: Internal Medicine

## 2013-04-24 ENCOUNTER — Ambulatory Visit (INDEPENDENT_AMBULATORY_CARE_PROVIDER_SITE_OTHER): Payer: Medicare PPO | Admitting: Internal Medicine

## 2013-04-24 ENCOUNTER — Encounter (INDEPENDENT_AMBULATORY_CARE_PROVIDER_SITE_OTHER): Payer: Self-pay | Admitting: Internal Medicine

## 2013-04-24 ENCOUNTER — Encounter (HOSPITAL_COMMUNITY): Payer: Self-pay | Admitting: Pharmacy Technician

## 2013-04-24 ENCOUNTER — Other Ambulatory Visit (INDEPENDENT_AMBULATORY_CARE_PROVIDER_SITE_OTHER): Payer: Self-pay | Admitting: *Deleted

## 2013-04-24 ENCOUNTER — Telehealth (INDEPENDENT_AMBULATORY_CARE_PROVIDER_SITE_OTHER): Payer: Self-pay | Admitting: *Deleted

## 2013-04-24 VITALS — BP 112/66 | HR 64 | Temp 98.3°F | Ht 60.0 in | Wt 134.7 lb

## 2013-04-24 DIAGNOSIS — R197 Diarrhea, unspecified: Secondary | ICD-10-CM

## 2013-04-24 DIAGNOSIS — Z1211 Encounter for screening for malignant neoplasm of colon: Secondary | ICD-10-CM

## 2013-04-24 MED ORDER — HYOSCYAMINE SULFATE 0.125 MG SL SUBL: 0.1250 mg | SUBLINGUAL_TABLET | SUBLINGUAL | Status: DC | PRN

## 2013-04-24 MED ORDER — PEG 3350-KCL-NA BICARB-NACL 420 G PO SOLR: 4000.0000 mL | Freq: Once | ORAL | Status: DC

## 2013-04-24 NOTE — Telephone Encounter (Signed)
Patient needs trilyte 

## 2013-04-24 NOTE — Progress Notes (Signed)
Subjective:     Patient ID: [name], female   DOB: 1945-06-20, 68 y.o.   MRN: [id number]  HPI  Referred to our office for chronic diarrhea. She says she has urgency after eating. She says the diarrhea does not occur on a daily basis. The diarrhea started in July a couple of weeks before her mother died. If she eats sausage at breakfast, she will have to go the BR.  She thought maybe it was coming from depression after her mother died. She says she is not depressed now and still has diarrhea. She denies any abdominal pain except when she has a BM. She saw blood x 1 a couple weeks ago. She had several BMs that day. She saw the blood on the toilet tissue. Her appetite is good. No weight loss. Usually has a BM every 3-5 days. Sometimes she has constipation.  No recent antibiotics. No stool studies.  She has never undergone a colonoscopy in the past.    10/26/2012 CT abdomen/pelvis with CM: diarrhea, and rt upper quadrant pain: IMPRESSION: No acute abnormality identified in the abdomen and  pelvis. The patient status post prior cholecystectomy and  appendectomy.  10/15/2012 NA 140, K 5.2, bili 0.4, ALP 54, AST 21, ALT 15, H and H 12.6 and 37.6, Platelet ct 238,   Review of Systems Past Medical History  Diagnosis Date  . Hyperlipidemia   . Thyroid disease     Hypothyroidism  . Rheumatic fever   . Hepatitis   . Cerebrovascular occlusion 2001    Extracranial with Hx of TIA's  . Myocardial infarction   . Carotid artery occlusion 05/08/2007  . Stroke     Past Surgical History  Procedure Laterality Date  . Pr vein bypass graft,aorto-fem-pop  2001  . Carotid endarterectomy  11/25/1999    S/P Left  . Carotid endarterectomy  03/24/2004    Left CCA stent- Dr. Amedeo Plenty  . Carotid stent  2006    Allergies  Allergen Reactions  . Penicillins Swelling  . Morphine And Related Nausea And Vomiting    Current Outpatient Prescriptions on File Prior to Visit  Medication Sig Dispense Refill   . aspirin 81 MG tablet Take 81 mg by mouth daily.      . budesonide-formoterol (SYMBICORT) 80-4.5 MCG/ACT inhaler Inhale 2 puffs into the lungs daily.       . calcium citrate (CALCITRATE - DOSED IN MG ELEMENTAL CALCIUM) 950 MG tablet Take 200 mg of elemental calcium by mouth daily.      . Cyanocobalamin (VITAMIN B 12 PO) Inject into the skin every 30 (thirty) days.       . fish oil-omega-3 fatty acids 1000 MG capsule Take 1,000 mg by mouth daily.      Marland Kitchen HYDROcodone-acetaminophen (VICODIN) 5-500 MG per tablet Take by mouth at bedtime as needed and may repeat dose one time if needed. Half tab hs      . levothyroxine (LEVOXYL) 100 MCG tablet Take 125 mcg by mouth daily.       Marland Kitchen LORazepam (ATIVAN) 0.5 MG tablet Take 0.5 mg by mouth every 8 (eight) hours as needed. Take three tabs      . Multiple Vitamin (MULTIVITAMIN) tablet Take 1 tablet by mouth daily.      . nebivolol (BYSTOLIC) 5 MG tablet Take 10 mg by mouth daily.       . rosuvastatin (CRESTOR) 20 MG tablet Take 20 mg by mouth daily.       No current  facility-administered medications on file prior to visit.        Objective:   Physical Exam  Filed Vitals:   04/24/13 1106  BP: 112/66  Pulse: 64  Temp: 98.3 F (36.8 C)  Height: 5' (1.524 m)  Weight: 134 lb 11.2 oz (61.1 kg)   Alert and oriented. Skin warm and dry. Oral mucosa is moist.   . Sclera anicteric, conjunctivae is pink. Thyroid not enlarged. No cervical lymphadenopathy. Lungs clear. Heart regular rate and rhythm.  Abdomen is soft. Bowel sounds are positive. No hepatomegaly. No abdominal masses felt. No tenderness.  No edema to lower extremities.       Assessment:    Diarrhea. Possible IBS, Colonic neoplasm needs to be ruled out. C-diff needs to be ruled out.     Plan:    GI Pathogen, Colonoscopy.  Rx for Levsin 0.125mg  sl every 6 hrs as needed.

## 2013-04-24 NOTE — Patient Instructions (Signed)
Colonoscopy. GI pathogen, Levsin sl q 6 hrs.

## 2013-05-08 ENCOUNTER — Encounter (HOSPITAL_COMMUNITY): Payer: Self-pay | Admitting: *Deleted

## 2013-05-08 ENCOUNTER — Ambulatory Visit (HOSPITAL_COMMUNITY)
Admission: RE | Admit: 2013-05-08 | Discharge: 2013-05-08 | Disposition: A | Discharge status: Home / Self Care | Payer: Medicare PPO | Source: Ambulatory Visit | Attending: Internal Medicine | Admitting: Internal Medicine

## 2013-05-08 ENCOUNTER — Encounter (HOSPITAL_COMMUNITY)
Admission: RE | Disposition: A | Discharge status: Home / Self Care | Payer: Self-pay | Source: Ambulatory Visit | Attending: Internal Medicine

## 2013-05-08 DIAGNOSIS — D126 Benign neoplasm of colon, unspecified: Secondary | ICD-10-CM

## 2013-05-08 DIAGNOSIS — F411 Generalized anxiety disorder: Secondary | ICD-10-CM | POA: Insufficient documentation

## 2013-05-08 DIAGNOSIS — R197 Diarrhea, unspecified: Secondary | ICD-10-CM

## 2013-05-08 DIAGNOSIS — K644 Residual hemorrhoidal skin tags: Secondary | ICD-10-CM | POA: Insufficient documentation

## 2013-05-08 DIAGNOSIS — F17200 Nicotine dependence, unspecified, uncomplicated: Secondary | ICD-10-CM | POA: Insufficient documentation

## 2013-05-08 DIAGNOSIS — K5730 Diverticulosis of large intestine without perforation or abscess without bleeding: Secondary | ICD-10-CM

## 2013-05-08 DIAGNOSIS — Z7982 Long term (current) use of aspirin: Secondary | ICD-10-CM | POA: Insufficient documentation

## 2013-05-08 DIAGNOSIS — E785 Hyperlipidemia, unspecified: Secondary | ICD-10-CM | POA: Insufficient documentation

## 2013-05-08 HISTORY — PX: COLONOSCOPY: SHX5424

## 2013-05-08 HISTORY — DX: Anxiety disorder, unspecified: F41.9

## 2013-05-08 SURGERY — COLONOSCOPY
Anesthesia: Moderate Sedation

## 2013-05-08 MED ORDER — SODIUM CHLORIDE 0.9 % IV SOLN
INTRAVENOUS | Status: DC
  Administered 2013-05-08: 10:00:00 via INTRAVENOUS

## 2013-05-08 MED ORDER — PSYLLIUM 28 % PO PACK: 1.0000 | PACK | Freq: Every day | ORAL | Status: DC

## 2013-05-08 MED ORDER — ALIGN 4 MG PO CAPS: 1.0000 | ORAL_CAPSULE | Freq: Every day | ORAL | Status: DC

## 2013-05-08 MED ORDER — MIDAZOLAM HCL 5 MG/5ML IJ SOLN
INTRAMUSCULAR | Status: AC
  Filled 2013-05-08: qty 10

## 2013-05-08 MED ORDER — MEPERIDINE HCL 50 MG/ML IJ SOLN
INTRAMUSCULAR | Status: DC | PRN
  Administered 2013-05-08 (×2): 25 mg via INTRAVENOUS

## 2013-05-08 MED ORDER — STERILE WATER FOR IRRIGATION IR SOLN
Status: DC | PRN
  Administered 2013-05-08: 10:00:00

## 2013-05-08 MED ORDER — MEPERIDINE HCL 50 MG/ML IJ SOLN
INTRAMUSCULAR | Status: AC
  Filled 2013-05-08: qty 1

## 2013-05-08 MED ORDER — MIDAZOLAM HCL 5 MG/5ML IJ SOLN
INTRAMUSCULAR | Status: AC
  Filled 2013-05-08: qty 5

## 2013-05-08 MED ORDER — MIDAZOLAM HCL 5 MG/5ML IJ SOLN
INTRAMUSCULAR | Status: DC | PRN
  Administered 2013-05-08: 3 mg via INTRAVENOUS
  Administered 2013-05-08 (×5): 2 mg via INTRAVENOUS

## 2013-05-08 MED ORDER — DICYCLOMINE HCL 10 MG PO CAPS: 10.0000 mg | ORAL_CAPSULE | Freq: Three times a day (TID) | ORAL | Status: DC

## 2013-05-08 NOTE — Op Note (Signed)
COLONOSCOPY PROCEDURE REPORT  PATIENT:  [name]  MR#:  [id number] Birthdate:  09/23/1945, 68 y.o., female Endoscopist:  Dr. Rogene Houston, MD Referred By:  Dr. Sherrie Mustache, MD  Procedure Date: 05/08/2013  Procedure:   Colonoscopy  Indications:  Patient is 68 year old Caucasian female who presents with chronic diarrhea. She possibly has IBS but not responding to therapy. She had single episode of hematochezia 3 weeks ago.  Informed Consent:  The procedure and risks were reviewed with the patient and informed consent was obtained.  Medications:  Demerol 50 mg IV Versed 10 IV  Description of procedure:  After a digital rectal exam was performed, that colonoscope was advanced from the anus through the rectum and colon to the area of the cecum, ileocecal valve and appendiceal orifice. The cecum was deeply intubated. These structures were well-seen and photographed for the record. From the level of the cecum and ileocecal valve, the scope was slowly and cautiously withdrawn. The mucosal surfaces were carefully surveyed utilizing scope tip to flexion to facilitate fold flattening as needed. The scope was pulled down into the rectum where a thorough exam including retroflexion was performed. Terminal ileum was also examined.  Findings:   Prep excellent. Normal mucosa of terminal ileum. Small polyps ablated via cold biopsy from splenic flexure. Scattered diverticula noted at sigmoid colon. Random biopsies taken for mucosa of sigmoid colon. Normal rectal mucosa. Small hemorrhoids below the dentate line.    Therapeutic/Diagnostic Maneuvers Performed:  See above  Complications:  None  Cecal Withdrawal Time:  11 minutes  Impression:  Normal mucosa of terminal ileum. Small polyp ablated via cold biopsy from splenic flexure. Moderate sigmoid colon diverticulosis. Small external hemorrhoids. Random biopsies taken for mucosa of sigmoid colon looking for collagenous or  microscopic colitis.  Recommendations:  Standard instructions given. Discontinue Levsin. Dicyclomine 10 mg by mouth a.c. Metamucil 4 g by mouth each bedtime. Align one capsule by mouth daily. I will contact patient with biopsy results and further recommendations.   Allecia Bells U  05/08/2013 11:09 AM  CC: Dr. Sherrie Mustache, MD & Dr. Rayne Du ref. provider found

## 2013-05-08 NOTE — Discharge Instructions (Signed)
Discontinue hyoscyamine or Levsin but resume other medications as before. Dicyclomine 10 mg by mouth 30 minutes before each meal. Metamucil 4 g one packet by mouth daily at bedtime. Align or equivalent one capsule by mouth daily. No driving for 24 hours. Physician will contact you with biopsy results     Colonoscopy A colonoscopy is an exam to look at the entire large intestine (colon). This exam can help find problems such as tumors, polyps, inflammation, and areas of bleeding. The exam takes about 1 hour.  LET [facility] CARE PROVIDER KNOW ABOUT:   Any allergies you have.  All medicines you are taking, including vitamins, herbs, eye drops, creams, and over-the-counter medicines.  Previous problems you or members of your family have had with the use of anesthetics.  Any blood disorders you have.  Previous surgeries you have had.  Medical conditions you have. RISKS AND COMPLICATIONS  Generally, this is a safe procedure. However, as with any procedure, complications can occur. Possible complications include:  Bleeding.  Tearing or rupture of the colon wall.  Reaction to medicines given during the exam.  Infection (rare). BEFORE THE PROCEDURE   Ask your health care provider about changing or stopping your regular medicines.  You may be prescribed an oral bowel prep. This involves drinking a large amount of medicated liquid, starting the day before your procedure. The liquid will cause you to have multiple loose stools until your stool is almost clear or light green. This cleans out your colon in preparation for the procedure.  Do not eat or drink anything else once you have started the bowel prep, unless your health care provider tells you it is safe to do so.  Arrange for someone to drive you home after the procedure. PROCEDURE   You will be given medicine to help you relax (sedative).  You will lie on your side with your knees bent.  A long, flexible tube with a  light and camera on the end (colonoscope) will be inserted through the rectum and into the colon. The camera sends video back to a computer screen as it moves through the colon. The colonoscope also releases carbon dioxide gas to inflate the colon. This helps your health care provider see the area better.  During the exam, your health care provider may take a small tissue sample (biopsy) to be examined under a microscope if any abnormalities are found.  The exam is finished when the entire colon has been viewed. AFTER THE PROCEDURE   Do not drive for 24 hours after the exam.  You may have a small amount of blood in your stool.  You may pass moderate amounts of gas and have mild abdominal cramping or bloating. This is caused by the gas used to inflate your colon during the exam.  Ask when your test results will be ready and how you will get your results. Make sure you get your test results. Document Released: 02/05/2000 Document Revised: 11/28/2012 Document Reviewed: 10/15/2012 [facility] Patient Information 2014 [place]. High-Fiber Diet Fiber is found in fruits, vegetables, and grains. A high-fiber diet encourages the addition of more whole grains, legumes, fruits, and vegetables in your diet. The recommended amount of fiber for adult males is 38 g per day. For adult females, it is 25 g per day. Pregnant and lactating women should get 28 g of fiber per day. If you have a digestive or bowel problem, ask your caregiver for advice before adding high-fiber foods to your diet. Eat  a variety of high-fiber foods instead of only a select few type of foods.  PURPOSE  To increase stool bulk.  To make bowel movements more regular to prevent constipation.  To lower cholesterol.  To prevent overeating. WHEN IS THIS DIET USED?  It may be used if you have constipation and hemorrhoids.  It may be used if you have uncomplicated diverticulosis (intestine condition) and irritable bowel  syndrome.  It may be used if you need help with weight management.  It may be used if you want to add it to your diet as a protective measure against atherosclerosis, diabetes, and cancer. SOURCES OF FIBER  Whole-grain breads and cereals.  Fruits, such as apples, oranges, bananas, berries, prunes, and pears.  Vegetables, such as green peas, carrots, sweet potatoes, beets, broccoli, cabbage, spinach, and artichokes.  Legumes, such split peas, soy, lentils.  Almonds. FIBER CONTENT IN FOODS Starches and Grains / Dietary Fiber (g)  Cheerios, 1 cup / 3 g  Corn Flakes cereal, 1 cup / 0.7 g  Rice crispy treat cereal, 1 cup / 0.3 g  Instant oatmeal (cooked),  cup / 2 g  Frosted wheat cereal, 1 cup / 5.1 g  Brown, long-grain rice (cooked), 1 cup / 3.5 g  White, long-grain rice (cooked), 1 cup / 0.6 g  Enriched macaroni (cooked), 1 cup / 2.5 g Legumes / Dietary Fiber (g)  Baked beans (canned, plain, or vegetarian),  cup / 5.2 g  Kidney beans (canned),  cup / 6.8 g  Pinto beans (cooked),  cup / 5.5 g Breads and Crackers / Dietary Fiber (g)  Plain or honey graham crackers, 2 squares / 0.7 g  Saltine crackers, 3 squares / 0.3 g  Plain, salted pretzels, 10 pieces / 1.8 g  Whole-wheat bread, 1 slice / 1.9 g  White bread, 1 slice / 0.7 g  Raisin bread, 1 slice / 1.2 g  Plain bagel, 3 oz / 2 g  Flour tortilla, 1 oz / 0.9 g  Corn tortilla, 1 small / 1.5 g  Hamburger or hotdog bun, 1 small / 0.9 g Fruits / Dietary Fiber (g)  Apple with skin, 1 medium / 4.4 g  Sweetened applesauce,  cup / 1.5 g  Banana,  medium / 1.5 g  Grapes, 10 grapes / 0.4 g  Orange, 1 small / 2.3 g  Raisin, 1.5 oz / 1.6 g  Melon, 1 cup / 1.4 g Vegetables / Dietary Fiber (g)  Green beans (canned),  cup / 1.3 g  Carrots (cooked),  cup / 2.3 g  Broccoli (cooked),  cup / 2.8 g  Peas (cooked),  cup / 4.4 g  Mashed potatoes,  cup / 1.6 g  Lettuce, 1 cup / 0.5 g  Corn  (canned),  cup / 1.6 g  Tomato,  cup / 1.1 g Document Released: 02/07/2005 Document Revised: 08/09/2011 Document Reviewed: 05/12/2011 [facility] Patient Information 2014 [place], [place]. Hemorrhoids Hemorrhoids are swollen veins around the rectum or anus. There are two types of hemorrhoids:   Internal hemorrhoids. These occur in the veins just inside the rectum. They may poke through to the outside and become irritated and painful.  External hemorrhoids. These occur in the veins outside the anus and can be felt as a painful swelling or hard lump near the anus. CAUSES  Pregnancy.   Obesity.   Constipation or diarrhea.   Straining to have a bowel movement.   Sitting for long periods on the toilet.  Heavy lifting  or other activity that caused you to strain.  Anal intercourse. SYMPTOMS   Pain.   Anal itching or irritation.   Rectal bleeding.   Fecal leakage.   Anal swelling.   One or more lumps around the anus.  DIAGNOSIS  Your caregiver may be able to diagnose hemorrhoids by visual examination. Other examinations or tests that may be performed include:   Examination of the rectal area with a gloved hand (digital rectal exam).   Examination of anal canal using a small tube (scope).   A blood test if you have lost a significant amount of blood.  A test to look inside the colon (sigmoidoscopy or colonoscopy). TREATMENT Most hemorrhoids can be treated at home. However, if symptoms do not seem to be getting better or if you have a lot of rectal bleeding, your caregiver may perform a procedure to help make the hemorrhoids get smaller or remove them completely. Possible treatments include:   Placing a rubber band at the base of the hemorrhoid to cut off the circulation (rubber band ligation).   Injecting a chemical to shrink the hemorrhoid (sclerotherapy).   Using a tool to burn the hemorrhoid (infrared light therapy).   Surgically removing the  hemorrhoid (hemorrhoidectomy).   Stapling the hemorrhoid to block blood flow to the tissue (hemorrhoid stapling).  HOME CARE INSTRUCTIONS   Eat foods with fiber, such as whole grains, beans, nuts, fruits, and vegetables. Ask your doctor about taking products with added fiber in them (fibersupplements).  Increase fluid intake. Drink enough water and fluids to keep your urine clear or pale yellow.   Exercise regularly.   Go to the bathroom when you have the urge to have a bowel movement. Do not wait.   Avoid straining to have bowel movements.   Keep the anal area dry and clean. Use wet toilet paper or moist towelettes after a bowel movement.   Medicated creams and suppositories may be used or applied as directed.   Only take over-the-counter or prescription medicines as directed by your caregiver.   Take warm sitz baths for 15 20 minutes, 3 4 times a day to ease pain and discomfort.   Place ice packs on the hemorrhoids if they are tender and swollen. Using ice packs between sitz baths may be helpful.   Put ice in a plastic bag.   Place a towel between your skin and the bag.   Leave the ice on for 15 20 minutes, 3 4 times a day.   Do not use a donut-shaped pillow or sit on the toilet for long periods. This increases blood pooling and pain.  SEEK MEDICAL CARE IF:  You have increasing pain and swelling that is not controlled by treatment or medicine.  You have uncontrolled bleeding.  You have difficulty or you are unable to have a bowel movement.  You have pain or inflammation outside the area of the hemorrhoids. MAKE SURE YOU:  Understand these instructions.  Will watch your condition.  Will get help right away if you are not doing well or get worse. Document Released: 02/05/2000 Document Revised: 01/25/2012 Document Reviewed: 12/13/2011 [facility] Patient Information 2014 [place]. Diverticulosis Diverticulosis is a common condition that develops  when small pouches (diverticula) form in the wall of the colon. The risk of diverticulosis increases with age. It happens more often in people who eat a low-fiber diet. Most individuals with diverticulosis have no symptoms. Those individuals with symptoms usually experience abdominal pain, constipation, or loose stools (diarrhea).  HOME CARE INSTRUCTIONS   Increase the amount of fiber in your diet as directed by your caregiver or dietician. This may reduce symptoms of diverticulosis.  Your caregiver may recommend taking a dietary fiber supplement.  Drink at least 6 to 8 glasses of water each day to prevent constipation.  Try not to strain when you have a bowel movement.  Your caregiver may recommend avoiding nuts and seeds to prevent complications, although this is still an uncertain benefit.  Only take over-the-counter or prescription medicines for pain, discomfort, or fever as directed by your caregiver. FOODS WITH HIGH FIBER CONTENT INCLUDE:  Fruits. Apple, peach, pear, tangerine, raisins, prunes.  Vegetables. Brussels sprouts, asparagus, broccoli, cabbage, carrot, cauliflower, romaine lettuce, spinach, summer squash, tomato, winter squash, zucchini.  Starchy Vegetables. Baked beans, kidney beans, lima beans, split peas, lentils, potatoes (with skin).  Grains. Whole wheat bread, brown rice, bran flake cereal, plain oatmeal, white rice, shredded wheat, bran muffins. SEEK IMMEDIATE MEDICAL CARE IF:   You develop increasing pain or severe bloating.  You have an oral temperature above 102 F (38.9 C), not controlled by medicine.  You develop vomiting or bowel movements that are bloody or black. Document Released: 11/05/2003 Document Revised: 05/02/2011 Document Reviewed: 07/08/2009 [facility] Patient Information 2014 [place]. Colon Polyps Polyps are lumps of extra tissue growing inside the body. Polyps can grow in the large intestine (colon). Most colon polyps are noncancerous  (benign). However, some colon polyps can become cancerous over time. Polyps that are larger than a pea may be harmful. To be safe, caregivers remove and test all polyps. CAUSES  Polyps form when mutations in the genes cause your cells to grow and divide even though no more tissue is needed. RISK FACTORS There are a number of risk factors that can increase your chances of getting colon polyps. They include:  Being older than 50 years.  Family history of colon polyps or colon cancer.  Long-term colon diseases, such as colitis or Crohn disease.  Being overweight.  Smoking.  Being inactive.  Drinking too much alcohol. SYMPTOMS  Most small polyps do not cause symptoms. If symptoms are present, they may include:  Blood in the stool. The stool may look dark red or black.  Constipation or diarrhea that lasts longer than 1 week. DIAGNOSIS People often do not know they have polyps until their caregiver finds them during a regular checkup. Your caregiver can use 4 tests to check for polyps:  Digital rectal exam. The caregiver wears gloves and feels inside the rectum. This test would find polyps only in the rectum.  Barium enema. The caregiver puts a liquid called barium into your rectum before taking X-rays of your colon. Barium makes your colon look white. Polyps are dark, so they are easy to see in the X-ray pictures.  Sigmoidoscopy. A thin, flexible tube (sigmoidoscope) is placed into your rectum. The sigmoidoscope has a light and tiny camera in it. The caregiver uses the sigmoidoscope to look at the last third of your colon.  Colonoscopy. This test is like sigmoidoscopy, but the caregiver looks at the entire colon. This is the most common method for finding and removing polyps. TREATMENT  Any polyps will be removed during a sigmoidoscopy or colonoscopy. The polyps are then tested for cancer. PREVENTION  To help lower your risk of getting more colon polyps:  Eat plenty of fruits and  vegetables. Avoid eating fatty foods.  Do not smoke.  Avoid drinking alcohol.  Exercise every day.  Lose weight if recommended by your caregiver.  Eat plenty of calcium and folate. Foods that are rich in calcium include milk, cheese, and broccoli. Foods that are rich in folate include chickpeas, kidney beans, and spinach. HOME CARE INSTRUCTIONS Keep all follow-up appointments as directed by your caregiver. You may need periodic exams to check for polyps. SEEK MEDICAL CARE IF: You notice bleeding during a bowel movement. Document Released: 11/04/2003 Document Revised: 05/02/2011 Document Reviewed: 04/19/2011 [facility] Patient Information 2014 [place].

## 2013-05-08 NOTE — H&P (Signed)
[name] is an 68 y.o. female.   Chief Complaint: Patient is here for colonoscopy. HPI: Patient is 68 year old Caucasian female who presents with seven-month history of diarrhea which occurs within minutes of eating associated with urgency. She had single episode of hematochezia 2 weeks ago. She has been under a lot of stress last year after having lost 5 family members. She denies weight loss. She was treated with dicyclomine and she says it did not help but she did not take medicine regularly. Family history is negative for CRC.  Past Medical History  Diagnosis Date  . Hyperlipidemia   . Thyroid disease     Hypothyroidism  . Rheumatic fever   . Hepatitis   . Cerebrovascular occlusion 2001    Extracranial with Hx of TIA's  . Myocardial infarction   . Carotid artery occlusion 05/08/2007  . Stroke   . Anxiety     Past Surgical History  Procedure Laterality Date  . Pr vein bypass graft,aorto-fem-pop  2001  . Carotid endarterectomy  11/25/1999    S/P Left  . Carotid endarterectomy  03/24/2004    Left CCA stent- Dr. Amedeo Plenty  . Carotid stent  2006    Family History  Problem Relation Age of Onset  . Cancer Sister   . Hypertension Father   . Heart attack Mother   . Hyperlipidemia Mother   . Kidney disease Brother   . Cancer Brother   . Cancer Brother     Lung   . Colon cancer Neg Hx    Social History:  reports that she has been smoking Cigarettes.  She has a 10 pack-year smoking history. She has never used smokeless tobacco. She reports that she does not drink alcohol or use illicit drugs.  Allergies:  Allergies  Allergen Reactions  . Penicillins Swelling  . Morphine And Related Nausea And Vomiting    Medications Prior to Admission  Medication Sig Dispense Refill  . aspirin 81 MG tablet Take 81 mg by mouth daily.      . budesonide-formoterol (SYMBICORT) 80-4.5 MCG/ACT inhaler Inhale 2 puffs into the lungs daily.       Marland Kitchen CALCIUM PO Take 1 tablet by mouth daily.      .  Cyanocobalamin (VITAMIN B 12 PO) Inject into the skin every 30 (thirty) days.       . fish oil-omega-3 fatty acids 1000 MG capsule Take 1,000 mg by mouth 3 (three) times daily.       Marland Kitchen HYDROcodone-acetaminophen (VICODIN) 5-500 MG per tablet Take 0.5 tablets by mouth at bedtime as needed for pain. Half tab hs      . hyoscyamine (LEVSIN SL) 0.125 MG SL tablet Place 0.125 mg under the tongue every 4 (four) hours as needed for cramping.      Marland Kitchen levothyroxine (LEVOXYL) 125 MCG tablet Take 125 mcg by mouth daily before breakfast.      . LORazepam (ATIVAN) 0.5 MG tablet Take 1.5 mg by mouth at bedtime.       . meclizine (ANTIVERT) 25 MG tablet Take 25 mg by mouth 3 (three) times daily as needed for dizziness.      . Multiple Vitamin (MULTIVITAMIN) tablet Take 1 tablet by mouth daily.      . nebivolol (BYSTOLIC) 10 MG tablet Take 10 mg by mouth daily.      Marland Kitchen omeprazole (PRILOSEC) 20 MG capsule Take 20 mg by mouth daily as needed.      . polyethylene glycol-electrolytes (NULYTELY/GOLYTELY) 420 G solution  Take 4,000 mLs by mouth once.  4000 mL  0  . rosuvastatin (CRESTOR) 20 MG tablet Take 20 mg by mouth daily.        No results found for this or any previous visit (from the past 48 hour(s)). No results found.  ROS  Blood pressure 147/71, pulse 71, temperature 98.3 F (36.8 C), temperature source Oral, resp. rate 20, height 5' (1.524 m), weight 134 lb (60.782 kg), SpO2 93.00%. Physical Exam  Constitutional: She appears well-developed and well-nourished.  HENT:  Mouth/Throat: Oropharynx is clear and moist.  Eyes: Conjunctivae are normal. No scleral icterus.  Neck: No thyromegaly present.  Cardiovascular: Normal rate, regular rhythm and normal heart sounds.   No murmur heard. Respiratory: Effort normal and breath sounds normal.  GI: Soft. She exhibits no distension and no mass. There is no tenderness.  Musculoskeletal: She exhibits no edema.  Lymphadenopathy:    She has no cervical adenopathy.   Neurological: She is alert.  Skin: Skin is warm and dry.     Assessment/Plan Chronic diarrhea. Single episode of hematochezia.. Diagnostic colonoscopy.  REHMAN,NAJEEB U 05/08/2013, 10:18 AM

## 2013-05-13 ENCOUNTER — Encounter (HOSPITAL_COMMUNITY): Payer: Self-pay | Admitting: Internal Medicine

## 2013-05-16 ENCOUNTER — Encounter (INDEPENDENT_AMBULATORY_CARE_PROVIDER_SITE_OTHER): Payer: Self-pay | Admitting: *Deleted

## 2013-06-06 ENCOUNTER — Telehealth (INDEPENDENT_AMBULATORY_CARE_PROVIDER_SITE_OTHER): Payer: Self-pay | Admitting: *Deleted

## 2013-06-06 NOTE — Telephone Encounter (Signed)
Patient will need to have a office visit for an assessment. May bring her in on Terri's schedule.

## 2013-06-06 NOTE — Telephone Encounter (Signed)
The medicine that was given to her after the TCS, dicyclomine 10 mg (3 times daily) and Probiotic (once daily), is not working. When she eats, she is either going to the bathroom or wearing her food. There is never a notice, it just comes on her. Would like to know if there is anything Dr. Laural Golden can do to help her. The return phone number is [phone].

## 2013-06-07 NOTE — Telephone Encounter (Signed)
Apt has been scheduled for 06/10/13 with Deberah Castle, NP.

## 2013-06-10 ENCOUNTER — Ambulatory Visit (INDEPENDENT_AMBULATORY_CARE_PROVIDER_SITE_OTHER): Payer: Medicare PPO | Admitting: Internal Medicine

## 2013-08-07 ENCOUNTER — Other Ambulatory Visit (HOSPITAL_COMMUNITY): Payer: Medicare PPO

## 2013-08-07 ENCOUNTER — Ambulatory Visit: Payer: Medicare PPO | Admitting: Family

## 2013-08-13 ENCOUNTER — Encounter: Payer: Self-pay | Admitting: Vascular Surgery

## 2013-08-14 ENCOUNTER — Ambulatory Visit (HOSPITAL_COMMUNITY)
Admission: RE | Admit: 2013-08-14 | Discharge: 2013-08-14 | Disposition: A | Discharge status: Home / Self Care | Payer: Medicare PPO | Source: Ambulatory Visit | Attending: Family | Admitting: Family

## 2013-08-14 ENCOUNTER — Other Ambulatory Visit: Payer: Self-pay | Admitting: Family

## 2013-08-14 ENCOUNTER — Ambulatory Visit: Payer: Commercial Managed Care - HMO | Admitting: Family

## 2013-08-14 DIAGNOSIS — Z48812 Encounter for surgical aftercare following surgery on the circulatory system: Secondary | ICD-10-CM

## 2013-08-14 DIAGNOSIS — I6529 Occlusion and stenosis of unspecified carotid artery: Secondary | ICD-10-CM

## 2013-08-14 DIAGNOSIS — I658 Occlusion and stenosis of other precerebral arteries: Secondary | ICD-10-CM | POA: Insufficient documentation

## 2013-08-15 ENCOUNTER — Encounter: Payer: Self-pay | Admitting: Surgery

## 2013-08-15 NOTE — Patient Instructions (Signed)
Dear Ms. Giesler, your recent vascular lab study 08-14-13 indicates: Essentially unchanged since previous study 01-30-13. Please follow up in 6 months with your CAROTID  Duplex.         Stroke Prevention Some medical conditions and behaviors are associated with an increased chance of having a stroke. You may prevent a stroke by making healthy choices and managing medical conditions. HOW CAN I REDUCE MY RISK OF HAVING A STROKE?   Stay physically active. Get at least 30 minutes of activity on most or all days.  Do not smoke. It may also be helpful to avoid exposure to secondhand smoke.  Limit alcohol use. Moderate alcohol use is considered to be:  No more than 2 drinks per day for men.  No more than 1 drink per day for nonpregnant women.  Eat healthy foods. This involves  Eating 5 or more servings of fruits and vegetables a day.  Following a diet that addresses high blood pressure (hypertension), high cholesterol, diabetes, or obesity.  Manage your cholesterol levels.  A diet low in saturated fat, trans fat, and cholesterol and high in fiber may control cholesterol levels.  Take any prescribed medicines to control cholesterol as directed by your health care Kiandre Spagnolo.  Manage your diabetes.  A controlled-carbohydrate, controlled-sugar diet is recommended to manage diabetes.  Take any prescribed medicines to control diabetes as directed by your health care Paulino Cork.  Control your hypertension.  A low-salt (sodium), low-saturated fat, low-trans fat, and low-cholesterol diet is recommended to manage hypertension.  Take any prescribed medicines to control hypertension as directed by your health care Pola Furno.  Maintain a healthy weight.  A reduced-calorie, low-sodium, low-saturated fat, low-trans fat, low-cholesterol diet is recommended to manage weight.  Stop drug abuse.  Avoid taking birth control pills.  Talk to your health care Vielka Klinedinst about the risks of taking  birth control pills if you are over 81 years old, smoke, get migraines, or have ever had a blood clot.  Get evaluated for sleep disorders (sleep apnea).  Talk to your health care Itzy Adler about getting a sleep evaluation if you snore a lot or have excessive sleepiness.  Take medicines as directed by your health care Lavalle Skoda.  For some people, aspirin or blood thinners (anticoagulants) are helpful in reducing the risk of forming abnormal blood clots that can lead to stroke. If you have the irregular heart rhythm of atrial fibrillation, you should be on a blood thinner unless there is a good reason you cannot take them.  Understand all your medicine instructions.  Make sure that other other conditions (such as anemia or atherosclerosis) are addressed. SEEK IMMEDIATE MEDICAL CARE IF:   You have sudden weakness or numbness of the face, arm, or leg, especially on one side of the body.  Your face or eyelid droops to one side.  You have sudden confusion.  You have trouble speaking (aphasia) or understanding.  You have sudden trouble seeing in one or both eyes.  You have sudden trouble walking.  You have dizziness.  You have a loss of balance or coordination.  You have a sudden, severe headache with no known cause.  You have new chest pain or an irregular heartbeat. Any of these symptoms may represent a serious problem that is an emergency. Do not wait to see if the symptoms will go away. Get medical help at once. Call your local emergency services  (911 in [place] Do not drive yourself to the hospital. Document Released: 03/17/2004 Document Revised: 11/28/2012 Document  Reviewed: 08/10/2012 [employer] Patient Information 2015 [place], [place]. This information is not intended to replace advice given to you by your health care Nikcole Eischeid. Make sure you discuss any questions you have with your health care Annitta Fifield. Smoking Cessation, Tips for Success If you are ready to quit smoking,  congratulations! You have chosen to help yourself be healthier. Cigarettes bring nicotine, tar, carbon monoxide, and other irritants into your body. Your lungs, heart, and blood vessels will be able to work better without these poisons. There are many different ways to quit smoking. Nicotine gum, nicotine patches, a nicotine inhaler, or nicotine nasal spray can help with physical craving. Hypnosis, support groups, and medicines help break the habit of smoking. WHAT THINGS CAN I DO TO MAKE QUITTING EASIER?  Here are some tips to help you quit for good:  Pick a date when you will quit smoking completely. Tell all of your friends and family about your plan to quit on that date.  Do not try to slowly cut down on the number of cigarettes you are smoking. Pick a quit date and quit smoking completely starting on that day.  Throw away all cigarettes.   Clean and remove all ashtrays from your home, work, and car.   On a card, write down your reasons for quitting. Carry the card with you and read it when you get the urge to smoke.   Cleanse your body of nicotine. Drink enough water and fluids to keep your urine clear or pale yellow. Do this after quitting to flush the nicotine from your body.   Learn to predict your moods. Do not let a bad situation be your excuse to have a cigarette. Some situations in your life might tempt you into wanting a cigarette.   Never have "just one" cigarette. It leads to wanting another and another. Remind yourself of your decision to quit.   Change habits associated with smoking. If you smoked while driving or when feeling stressed, try other activities to replace smoking. Stand up when drinking your coffee. Brush your teeth after eating. Sit in a different chair when you read the paper. Avoid alcohol while trying to quit, and try to drink fewer caffeinated beverages. Alcohol and caffeine may urge you to smoke.   Avoid foods and drinks that can trigger a desire to  smoke, such as sugary or spicy foods and alcohol.   Ask people who smoke not to smoke around you.   Have something planned to do right after eating or having a cup of coffee. For example, plan to take a walk or exercise.   Try a relaxation exercise to calm you down and decrease your stress. Remember, you may be tense and nervous for the first 2 weeks after you quit, but this will pass.   Find new activities to keep your hands busy. Play with a pen, coin, or rubber band. Doodle or draw things on paper.   Brush your teeth right after eating. This will help cut down on the craving for the taste of tobacco after meals. You can also try mouthwash.   Use oral substitutes in place of cigarettes. Try using lemon drops, carrots, cinnamon sticks, or chewing gum. Keep them handy so they are available when you have the urge to smoke.   When you have the urge to smoke, try deep breathing.   Designate your home as a nonsmoking area.   If you are a heavy smoker, ask your health care Dushawn Pusey about a prescription for  nicotine chewing gum. It can ease your withdrawal from nicotine.   Reward yourself. Set aside the cigarette money you save and buy yourself something nice.   Look for support from others. Join a support group or smoking cessation program. Ask someone at home or at work to help you with your plan to quit smoking.   Always ask yourself, "Do I need this cigarette or is this just a reflex?" Tell yourself, "Today, I choose not to smoke," or "I do not want to smoke." You are reminding yourself of your decision to quit.  Do not replace cigarette smoking with electronic cigarettes (commonly called e-cigarettes). The safety of e-cigarettes is unknown, and some may contain harmful chemicals.  If you relapse, do not give up! Plan ahead and think about what you will do the next time you get the urge to smoke.  HOW WILL I FEEL WHEN I QUIT SMOKING? You may have symptoms of withdrawal  because your body is used to nicotine (the addictive substance in cigarettes). You may crave cigarettes, be irritable, feel very hungry, cough often, get headaches, or have difficulty concentrating. The withdrawal symptoms are only temporary. They are strongest when you first quit but will go away within 10-14 days. When withdrawal symptoms occur, stay in control. Think about your reasons for quitting. Remind yourself that these are signs that your body is healing and getting used to being without cigarettes. Remember that withdrawal symptoms are easier to treat than the major diseases that smoking can cause.  Even after the withdrawal is over, expect periodic urges to smoke. However, these cravings are generally short lived and will go away whether you smoke or not. Do not smoke!  WHAT RESOURCES ARE AVAILABLE TO HELP ME QUIT SMOKING? Your health care Heydi Swango can direct you to community resources or hospitals for support, which may include:  Group support.  Education.  Hypnosis.  Therapy. Document Released: 11/06/2003 Document Revised: 11/28/2012 Document Reviewed: 07/26/2012 [facility] Patient Information 2015 [place], [place]. This information is not intended to replace advice given to you by your health care Dreshon Proffit. Make sure you discuss any questions you have with your health care Laurieanne Galloway.

## 2013-08-16 NOTE — Progress Notes (Signed)
Lab only 

## 2013-09-17 ENCOUNTER — Ambulatory Visit (INDEPENDENT_AMBULATORY_CARE_PROVIDER_SITE_OTHER): Payer: Medicare PPO | Admitting: Internal Medicine

## 2013-09-17 ENCOUNTER — Encounter (INDEPENDENT_AMBULATORY_CARE_PROVIDER_SITE_OTHER): Payer: Self-pay | Admitting: Internal Medicine

## 2013-09-17 VITALS — BP 118/52 | HR 72 | Temp 97.8°F | Ht 60.0 in | Wt 131.4 lb

## 2013-09-17 DIAGNOSIS — R197 Diarrhea, unspecified: Secondary | ICD-10-CM

## 2013-09-17 DIAGNOSIS — K589 Irritable bowel syndrome without diarrhea: Secondary | ICD-10-CM | POA: Insufficient documentation

## 2013-09-17 NOTE — Progress Notes (Signed)
Subjective:     Patient ID: [name], female   DOB: 1945-07-27, 68 y.o.   MRN: [id number]  HPI Here today for f/u after Colonoscopy in March. She tells me she is having diarrhea.  She tells me she has 3-4 firm stools a day, then her stools will become loose. She has had symptoms for about a year. She tells me she becomes weak after multiple stools. She denies any melena or BRRB. Stool studies in March were normal.  Appetite is good. No weight loss.  She has urgency after she eats. GERD controlled with Omeprazole.  Breads do not bother her. Has been under a lot of stress. Lost several family members last year.    05/08/2013 Colonoscopy: Dr. Laural Golden: diarrhea: Impression:  Normal mucosa of terminal ileum.  Small polyp ablated via cold biopsy from splenic flexure.  Moderate sigmoid colon diverticulosis.  Small external hemorrhoids.  Random biopsies taken for mucosa of sigmoid colon looking for collagenous or microscopic colitis.    Notes Recorded by Rogene Houston, MD on 05/13/2013 at 1:25 PM Patient states diarrhea has resolved with dicyclomine. She had one small polyp which was tubular adenoma. Biopsy from sigmoid colon negative for microscopic or collagenous colitis. Next colonoscopy in 10 years. Report to PCP. She will need office visit in 3 months regarding IBS/diarrhea     Review of Systems Past Medical History  Diagnosis Date  . Hyperlipidemia   . Thyroid disease     Hypothyroidism  . Rheumatic fever   . Hepatitis   . Cerebrovascular occlusion 2001    Extracranial with Hx of TIA's  . Myocardial infarction   . Carotid artery occlusion 05/08/2007  . Stroke   . Anxiety     Past Surgical History  Procedure Laterality Date  . Pr vein bypass graft,aorto-fem-pop  2001  . Carotid endarterectomy  11/25/1999    S/P Left  . Carotid endarterectomy  03/24/2004    Left CCA stent- Dr. Amedeo Plenty  . Carotid stent  2006  . Colonoscopy N/A 05/08/2013    Procedure: COLONOSCOPY;   Surgeon: Rogene Houston, MD;  Location: [facility];  Service: Endoscopy;  Laterality: N/A;  230-moved to [place] notified pt    Allergies  Allergen Reactions  . Penicillins Swelling  . Morphine And Related Nausea And Vomiting    Current Outpatient Prescriptions on File Prior to Visit  Medication Sig Dispense Refill  . aspirin 81 MG tablet Take 81 mg by mouth daily.      . budesonide-formoterol (SYMBICORT) 80-4.5 MCG/ACT inhaler Inhale 2 puffs into the lungs daily.       Marland Kitchen CALCIUM PO Take 1 tablet by mouth daily.      . Cyanocobalamin (VITAMIN B 12 PO) Inject into the skin every 30 (thirty) days.       . fish oil-omega-3 fatty acids 1000 MG capsule Take 1,000 mg by mouth 3 (three) times daily.       Marland Kitchen HYDROcodone-acetaminophen (VICODIN) 5-500 MG per tablet Take 0.5 tablets by mouth at bedtime as needed for pain. Half tab hs      . levothyroxine (LEVOXYL) 125 MCG tablet Take 125 mcg by mouth daily before breakfast.      . LORazepam (ATIVAN) 0.5 MG tablet Take 1.5 mg by mouth at bedtime.       . meclizine (ANTIVERT) 25 MG tablet Take 25 mg by mouth 3 (three) times daily as needed for dizziness.      . Multiple Vitamin (MULTIVITAMIN)  tablet Take 1 tablet by mouth daily.      . nebivolol (BYSTOLIC) 10 MG tablet Take 10 mg by mouth daily.      Marland Kitchen omeprazole (PRILOSEC) 20 MG capsule Take 20 mg by mouth daily as needed.      . Probiotic Product (ALIGN) 4 MG CAPS Take 1 capsule (4 mg total) by mouth daily.      . psyllium (METAMUCIL SMOOTH TEXTURE) 28 % packet Take 1 packet by mouth at bedtime.      . rosuvastatin (CRESTOR) 20 MG tablet Take 20 mg by mouth daily.      Marland Kitchen dicyclomine (BENTYL) 10 MG capsule Take 1 capsule (10 mg total) by mouth 3 (three) times daily before meals.  90 capsule  5   No current facility-administered medications on file prior to visit.        Objective:   Physical Exam  Filed Vitals:   09/17/13 0935  BP: 118/52  Pulse: 72  Temp: 97.8 F (36.6 C)  Height:  5' (1.524 m)  Weight: 131 lb 6.4 oz (59.603 kg)  Alert and oriented. Skin warm and dry. Oral mucosa is moist.   . Sclera anicteric, conjunctivae is pink. Thyroid not enlarged. No cervical lymphadenopathy. Lungs clear. Heart regular rate and rhythm.  Abdomen is soft. Bowel sounds are positive. No hepatomegaly. No abdominal masses felt. No tenderness.  No edema to lower extremities.        Assessment:    Diarrhea, felt to be IBS. Recent colonoscopy reveal tubular adenoma. Negative for microscopic or collagenous colitis. Stools studies were normal.     Plan:     Dicyclomine TID, Stop the Metamucil  .  May take Imodium one a day.  OV in one month.

## 2013-09-17 NOTE — Patient Instructions (Signed)
Dicyclomine three times a day. Imodium once a day. Stop the Metamucil.

## 2013-10-21 ENCOUNTER — Ambulatory Visit (INDEPENDENT_AMBULATORY_CARE_PROVIDER_SITE_OTHER): Payer: Medicare PPO | Admitting: Internal Medicine

## 2013-10-22 ENCOUNTER — Other Ambulatory Visit (INDEPENDENT_AMBULATORY_CARE_PROVIDER_SITE_OTHER): Payer: Self-pay | Admitting: Internal Medicine

## 2013-10-22 ENCOUNTER — Telehealth (INDEPENDENT_AMBULATORY_CARE_PROVIDER_SITE_OTHER): Payer: Self-pay | Admitting: *Deleted

## 2013-10-22 DIAGNOSIS — R197 Diarrhea, unspecified: Secondary | ICD-10-CM

## 2013-10-22 MED ORDER — DICYCLOMINE HCL 10 MG PO CAPS: 10.0000 mg | ORAL_CAPSULE | Freq: Two times a day (BID) | ORAL | Status: DC

## 2013-10-22 NOTE — Telephone Encounter (Signed)
[name] said the last time she seen [name], she increase her dicyclomine 20 mg 4 times daily. She is needing a new Rx sent to [facility] in [place]. The return phone number is [phone].

## 2013-10-22 NOTE — Telephone Encounter (Signed)
She tells me she is constipated now. I have sent an Rx for dicyclomine 10mg  BID She is not to take until she has a BM for several days. If her BMs are normal, hold the Dicyclomine.

## 2013-11-07 ENCOUNTER — Encounter (INDEPENDENT_AMBULATORY_CARE_PROVIDER_SITE_OTHER): Payer: Self-pay | Admitting: Internal Medicine

## 2013-11-07 ENCOUNTER — Ambulatory Visit (INDEPENDENT_AMBULATORY_CARE_PROVIDER_SITE_OTHER): Payer: Commercial Managed Care - HMO | Admitting: Internal Medicine

## 2013-11-07 VITALS — BP 132/62 | HR 60 | Temp 98.0°F | Ht 60.0 in | Wt 132.8 lb

## 2013-11-07 DIAGNOSIS — K589 Irritable bowel syndrome without diarrhea: Secondary | ICD-10-CM

## 2013-11-07 NOTE — Progress Notes (Signed)
Subjective:    Patient ID: [name], female    DOB: Jun 19, 1945, 68 y.o.   MRN: [id number]  HPI  Here today for f/u. She was last seen in july of this year. She gives a hx of diarrhea x 1 year. Started on dicyclomine. She underwent a colonoscopy in March of this year for diarrhea (Please see below). Biopsy negative for microscopic or collagenous colitis. She tells me she has been constipated. She took 2 laxatives recently. She says her stool was like rocks. She took a Vicodin for the abdominal cramps. Had been taking Dicyclomine BID.  She has started taking the Dicyclomine 10mg  once every other day. There has been no diarrhea.   Appetite is good. No weight loss.  Last weight in July 131. Today her weight is 132. No melena or BRRB.  States she tries to stay active. Recently loss a niece to an overdose. Last year she also lost a niece to an overdose and also lost her mother.       05/08/2013 Colonoscopy: Dr. Laural Golden: diarrhea:  Impression:  Normal mucosa of terminal ileum.  Small polyp ablated via cold biopsy from splenic flexure.  Moderate sigmoid colon diverticulosis.  Small external hemorrhoids.  Random biopsies taken for mucosa of sigmoid colon looking for collagenous or microscopic colitis.   Notes Recorded by Rogene Houston, MD on 05/13/2013 at 1:25 PM Patient states diarrhea has resolved with dicyclomine. She had one small polyp which was tubular adenoma. Biopsy from sigmoid colon negative for microscopic or collagenous colitis. Next colonoscopy in 10 years. Report to PCP. She will need office visit in 3 months regarding IBS/diarrhea    Review of Systems Past Medical History  Diagnosis Date  . Hyperlipidemia   . Thyroid disease     Hypothyroidism  . Rheumatic fever   . Hepatitis   . Cerebrovascular occlusion 2001    Extracranial with Hx of TIA's  . Myocardial infarction   . Carotid artery occlusion 05/08/2007  . Stroke   . Anxiety     Past Surgical History    Procedure Laterality Date  . Pr vein bypass graft,aorto-fem-pop  2001  . Carotid endarterectomy  11/25/1999    S/P Left  . Carotid endarterectomy  03/24/2004    Left CCA stent- Dr. Amedeo Plenty  . Carotid stent  2006  . Colonoscopy N/A 05/08/2013    Procedure: COLONOSCOPY;  Surgeon: Rogene Houston, MD;  Location: [facility];  Service: Endoscopy;  Laterality: N/A;  230-moved to [place] notified pt    Allergies  Allergen Reactions  . Penicillins Swelling  . Morphine And Related Nausea And Vomiting    Current Outpatient Prescriptions on File Prior to Visit  Medication Sig Dispense Refill  . aspirin 81 MG tablet Take 81 mg by mouth daily.      . budesonide-formoterol (SYMBICORT) 80-4.5 MCG/ACT inhaler Inhale 2 puffs into the lungs daily.       Marland Kitchen CALCIUM PO Take 1 tablet by mouth daily.      . Cyanocobalamin (VITAMIN B 12 PO) Inject into the skin every 30 (thirty) days.       Marland Kitchen dicyclomine (BENTYL) 10 MG capsule Take 1 capsule (10 mg total) by mouth 2 (two) times daily.  60 capsule  5  . fish oil-omega-3 fatty acids 1000 MG capsule Take 1,000 mg by mouth 3 (three) times daily.       Marland Kitchen HYDROcodone-acetaminophen (VICODIN) 5-500 MG per tablet Take 0.5 tablets by mouth at bedtime  as needed for pain. Half tab hs      . levothyroxine (LEVOXYL) 125 MCG tablet Take 125 mcg by mouth daily before breakfast.      . LORazepam (ATIVAN) 0.5 MG tablet Take 1.5 mg by mouth at bedtime.       . meclizine (ANTIVERT) 25 MG tablet Take 25 mg by mouth 3 (three) times daily as needed for dizziness.      . Multiple Vitamin (MULTIVITAMIN) tablet Take 1 tablet by mouth daily.      . nebivolol (BYSTOLIC) 10 MG tablet Take 10 mg by mouth daily.      Marland Kitchen omeprazole (PRILOSEC) 20 MG capsule Take 20 mg by mouth daily as needed.      . Probiotic Product (ALIGN) 4 MG CAPS Take 1 capsule (4 mg total) by mouth daily.      . psyllium (METAMUCIL SMOOTH TEXTURE) 28 % packet Take 1 packet by mouth at bedtime.      . rosuvastatin  (CRESTOR) 20 MG tablet Take 20 mg by mouth daily.       No current facility-administered medications on file prior to visit.        Objective:   Physical Exam  Filed Vitals:   11/07/13 0929  BP: 132/62  Pulse: 60  Temp: 98 F (36.7 C)  Height: 5' (1.524 m)  Weight: 132 lb 12.8 oz (60.238 kg)  Alert and oriented. Skin warm and dry. Oral mucosa is moist.   . Sclera anicteric, conjunctivae is pink. Thyroid not enlarged. No cervical lymphadenopathy. Lungs clear. Heart regular rate and rhythm.  Abdomen is soft. Bowel sounds are positive. No hepatomegaly. No abdominal masses felt. No tenderness.  No edema to lower extremities. Patient is alert and oriented.Alert and oriented. Skin warm and dry. Oral mucosa is moist.   . Sclera anicteric, conjunctivae is pink. Thyroid not enlarged. No cervical lymphadenopathy. Lungs clear. Heart regular rate and rhythm.  Abdomen is soft. Bowel sounds are positive. No hepatomegaly. No abdominal masses felt. No tenderness.  No edema to lower extremities. Patient is alert and oriented.         Assessment & Plan:  Probable IBS. Samples of IBARD given to patient. Take Dicyclomine every other day

## 2013-11-07 NOTE — Patient Instructions (Signed)
Samples of IBARD x 4 boxes. OV in 3 months. Dicyclomine 10mg  every other day.

## 2013-11-12 ENCOUNTER — Telehealth (INDEPENDENT_AMBULATORY_CARE_PROVIDER_SITE_OTHER): Payer: Self-pay | Admitting: *Deleted

## 2013-11-12 NOTE — Telephone Encounter (Signed)
Take the Dicyclomine once a day.

## 2013-11-12 NOTE — Telephone Encounter (Signed)
[name] stating she needed to speak with Terri about her medicine. The return phone number is [phone].

## 2014-02-06 ENCOUNTER — Ambulatory Visit (INDEPENDENT_AMBULATORY_CARE_PROVIDER_SITE_OTHER): Payer: Commercial Managed Care - HMO | Admitting: Internal Medicine

## 2014-02-07 ENCOUNTER — Encounter (INDEPENDENT_AMBULATORY_CARE_PROVIDER_SITE_OTHER): Payer: Self-pay

## 2014-02-12 ENCOUNTER — Telehealth (INDEPENDENT_AMBULATORY_CARE_PROVIDER_SITE_OTHER): Payer: Self-pay | Admitting: *Deleted

## 2014-02-12 ENCOUNTER — Encounter (INDEPENDENT_AMBULATORY_CARE_PROVIDER_SITE_OTHER): Payer: Self-pay | Admitting: *Deleted

## 2014-02-12 NOTE — Telephone Encounter (Signed)
[name] NO SHOWED for her apt on 02/06/14 with Deberah Castle, NP. A NS letter has been mailed.

## 2014-02-18 ENCOUNTER — Ambulatory Visit: Payer: Medicare PPO | Admitting: Family

## 2014-02-18 ENCOUNTER — Encounter: Payer: Self-pay | Admitting: Family

## 2014-02-18 ENCOUNTER — Other Ambulatory Visit (HOSPITAL_COMMUNITY): Payer: Medicare PPO

## 2014-02-25 ENCOUNTER — Ambulatory Visit: Payer: Medicare PPO | Admitting: Family

## 2014-02-25 ENCOUNTER — Other Ambulatory Visit (HOSPITAL_COMMUNITY): Payer: Medicare PPO

## 2014-03-05 ENCOUNTER — Encounter: Payer: Self-pay | Admitting: Family

## 2014-03-06 ENCOUNTER — Other Ambulatory Visit (HOSPITAL_COMMUNITY): Payer: Medicare PPO

## 2014-03-06 ENCOUNTER — Ambulatory Visit: Payer: Medicare PPO | Admitting: Family

## 2014-03-11 ENCOUNTER — Other Ambulatory Visit (HOSPITAL_COMMUNITY): Payer: Medicare PPO

## 2014-03-11 ENCOUNTER — Ambulatory Visit: Payer: Medicare PPO | Admitting: Family

## 2014-03-28 ENCOUNTER — Ambulatory Visit: Payer: Medicare PPO | Admitting: Family

## 2014-03-28 ENCOUNTER — Ambulatory Visit (HOSPITAL_COMMUNITY)
Admission: RE | Admit: 2014-03-28 | Discharge: 2014-03-28 | Disposition: A | Discharge status: Home / Self Care | Payer: Medicare HMO | Source: Ambulatory Visit | Attending: Family | Admitting: Family

## 2014-03-28 DIAGNOSIS — I6523 Occlusion and stenosis of bilateral carotid arteries: Secondary | ICD-10-CM | POA: Insufficient documentation

## 2014-03-28 DIAGNOSIS — Z48812 Encounter for surgical aftercare following surgery on the circulatory system: Secondary | ICD-10-CM | POA: Insufficient documentation

## 2014-04-09 ENCOUNTER — Encounter: Payer: Self-pay | Admitting: Vascular Surgery

## 2014-04-09 ENCOUNTER — Encounter: Payer: Self-pay | Admitting: Family

## 2014-04-09 NOTE — Patient Instructions (Signed)
Dear Mrs [name], Your recent Vascular Lab study Feb. 5, 2016 indicates: No significant  Change. Please follow up with [place] in 6 months.          Stroke Prevention Some medical conditions and behaviors are associated with an increased chance of having a stroke. You may prevent a stroke by making healthy choices and managing medical conditions. HOW CAN I REDUCE MY RISK OF HAVING A STROKE?   Stay physically active. Get at least 30 minutes of activity on most or all days.  Do not smoke. It may also be helpful to avoid exposure to secondhand smoke.  Limit alcohol use. Moderate alcohol use is considered to be:  No more than 2 drinks per day for men.  No more than 1 drink per day for nonpregnant women.  Eat healthy foods. This involves:  Eating 5 or more servings of fruits and vegetables a day.  Making dietary changes that address high blood pressure (hypertension), high cholesterol, diabetes, or obesity.  Manage your cholesterol levels.  Making food choices that are high in fiber and low in saturated fat, trans fat, and cholesterol may control cholesterol levels.  Take any prescribed medicines to control cholesterol as directed by your health care provider.  Manage your diabetes.  Controlling your carbohydrate and sugar intake is recommended to manage diabetes.  Take any prescribed medicines to control diabetes as directed by your health care provider.  Control your hypertension.  Making food choices that are low in salt (sodium), saturated fat, trans fat, and cholesterol is recommended to manage hypertension.  Take any prescribed medicines to control hypertension as directed by your health care provider.  Maintain a healthy weight.  Reducing calorie intake and making food choices that are low in sodium, saturated fat, trans fat, and cholesterol are recommended to manage weight.  Stop drug abuse.  Avoid taking birth control pills.  Talk to your health care  provider about the risks of taking birth control pills if you are over 24 years old, smoke, get migraines, or have ever had a blood clot.  Get evaluated for sleep disorders (sleep apnea).  Talk to your health care provider about getting a sleep evaluation if you snore a lot or have excessive sleepiness.  Take medicines only as directed by your health care provider.  For some people, aspirin or blood thinners (anticoagulants) are helpful in reducing the risk of forming abnormal blood clots that can lead to stroke. If you have the irregular heart rhythm of atrial fibrillation, you should be on a blood thinner unless there is a good reason you cannot take them.  Understand all your medicine instructions.  Make sure that other conditions (such as anemia or atherosclerosis) are addressed. SEEK IMMEDIATE MEDICAL CARE IF:   You have sudden weakness or numbness of the face, arm, or leg, especially on one side of the body.  Your face or eyelid droops to one side.  You have sudden confusion.  You have trouble speaking (aphasia) or understanding.  You have sudden trouble seeing in one or both eyes.  You have sudden trouble walking.  You have dizziness.  You have a loss of balance or coordination.  You have a sudden, severe headache with no known cause.  You have new chest pain or an irregular heartbeat. Any of these symptoms may represent a serious problem that is an emergency. Do not wait to see if the symptoms will go away. Get medical help at once. Call your local emergency  services (911 in [place] Do not drive yourself to the hospital. Document Released: 03/17/2004 Document Revised: 06/24/2013 Document Reviewed: 08/10/2012 [facility] Patient Information 2015 [place], [place]. This information is not intended to replace advice given to you by your health care provider. Make sure you discuss any questions you have with your health care provider.

## 2014-10-02 ENCOUNTER — Other Ambulatory Visit (INDEPENDENT_AMBULATORY_CARE_PROVIDER_SITE_OTHER): Payer: Self-pay | Admitting: Internal Medicine

## 2014-10-02 DIAGNOSIS — R197 Diarrhea, unspecified: Secondary | ICD-10-CM

## 2014-10-02 MED ORDER — DICYCLOMINE HCL 10 MG PO CAPS: 10.0000 mg | ORAL_CAPSULE | Freq: Two times a day (BID) | ORAL | Status: DC

## 2014-10-02 NOTE — Telephone Encounter (Signed)
Rx Dicyclomine refilled

## 2014-10-07 ENCOUNTER — Encounter: Payer: Self-pay | Admitting: Family

## 2014-10-08 ENCOUNTER — Ambulatory Visit: Payer: Self-pay | Admitting: Family

## 2014-10-08 ENCOUNTER — Other Ambulatory Visit: Payer: Self-pay | Admitting: Family

## 2014-10-08 ENCOUNTER — Inpatient Hospital Stay (HOSPITAL_COMMUNITY): Admission: RE | Admit: 2014-10-08 | Payer: Self-pay | Source: Ambulatory Visit

## 2014-10-08 DIAGNOSIS — I6523 Occlusion and stenosis of bilateral carotid arteries: Secondary | ICD-10-CM

## 2014-10-08 DIAGNOSIS — Z48812 Encounter for surgical aftercare following surgery on the circulatory system: Secondary | ICD-10-CM

## 2014-10-13 ENCOUNTER — Ambulatory Visit: Payer: Self-pay | Admitting: Neurology

## 2014-11-18 ENCOUNTER — Ambulatory Visit (INDEPENDENT_AMBULATORY_CARE_PROVIDER_SITE_OTHER): Payer: Medicare HMO | Admitting: Internal Medicine

## 2014-11-18 ENCOUNTER — Encounter (INDEPENDENT_AMBULATORY_CARE_PROVIDER_SITE_OTHER): Payer: Self-pay | Admitting: Internal Medicine

## 2014-11-18 VITALS — BP 108/62 | HR 72 | Temp 98.1°F | Ht 60.0 in | Wt 128.0 lb

## 2014-11-18 DIAGNOSIS — K589 Irritable bowel syndrome without diarrhea: Secondary | ICD-10-CM

## 2014-11-18 MED ORDER — HYOSCYAMINE SULFATE 0.125 MG SL SUBL: 0.1250 mg | SUBLINGUAL_TABLET | SUBLINGUAL | Status: DC | PRN

## 2014-11-18 NOTE — Patient Instructions (Signed)
Levsin .125mg  sl before meals.  OV in 6 months.

## 2014-11-18 NOTE — Progress Notes (Signed)
Subjective:    Patient ID: [name], female    DOB: April 02, 1945, 69 y.o.   MRN: [id number]  HPIHere today for f/u. She was last seen in September of 2015 by me.  Hx of diarrhea x 2 year and was started on Dicyclomine.  Last weight in September 2015 was 132. Today her weight is 128. She is taking the Dicyclomine 20mg  three times a day and she says it is not helped. She is having 1 stool a day if she is at home.   If she eats meats, or if she drinks 3 cups of coffee, or go out, she will have diarrhea. She states she took one of her daughter's xanax and it seemed to help her IBS.  05/08/2013 Colonoscopy: Dr. Laural Golden: diarrhea:  Impression:  Normal mucosa of terminal ileum.  Small polyp ablated via cold biopsy from splenic flexure.  Moderate sigmoid colon diverticulosis.  Small external hemorrhoids.  Random biopsies taken for mucosa of sigmoid colon looking for collagenous or microscopic colitis.   Notes Recorded by Rogene Houston, MD on 05/13/2013 at 1:25 PM Patient states diarrhea has resolved with dicyclomine. She had one small polyp which was tubular adenoma. Biopsy from sigmoid colon negative for microscopic or collagenous colitis. Next colonoscopy in 10 years. Report to PCP. She will need office visit in 3 months regarding IBS/diarrhea  Review of Systems Past Medical History  Diagnosis Date  . Hyperlipidemia   . Thyroid disease     Hypothyroidism  . Rheumatic fever   . Hepatitis   . Cerebrovascular occlusion 2001    Extracranial with Hx of TIA's  . Myocardial infarction   . Carotid artery occlusion 05/08/2007  . Stroke   . Anxiety     Past Surgical History  Procedure Laterality Date  . Pr vein bypass graft,aorto-fem-pop  2001  . Carotid stent  2006  . Colonoscopy N/A 05/08/2013    Procedure: COLONOSCOPY;  Surgeon: Rogene Houston, MD;  [facility];  Service: Endoscopy;  Laterality: N/A;  230-moved to [place] notified pt  . Carotid endarterectomy   11/25/1999    S/P Left  . Carotid endarterectomy  03/24/2004    Left CCA stent- Dr. Amedeo Plenty    Allergies  Allergen Reactions  . Penicillins Swelling  . Morphine And Related Nausea And Vomiting    Current Outpatient Prescriptions on File Prior to Visit  Medication Sig Dispense Refill  . aspirin 81 MG tablet Take 81 mg by mouth daily.    . budesonide-formoterol (SYMBICORT) 80-4.5 MCG/ACT inhaler Inhale 2 puffs into the lungs daily.     Marland Kitchen CALCIUM PO Take 1 tablet by mouth daily.    . Cyanocobalamin (VITAMIN B 12 PO) Inject into the skin every 30 (thirty) days.     Marland Kitchen dicyclomine (BENTYL) 10 MG capsule Take 1 capsule (10 mg total) by mouth 2 (two) times daily. (Patient taking differently: Take 10 mg by mouth 3 (three) times daily before meals. ) 60 capsule 5  . fish oil-omega-3 fatty acids 1000 MG capsule Take 1,000 mg by mouth 3 (three) times daily.     Marland Kitchen HYDROcodone-acetaminophen (VICODIN) 5-500 MG per tablet Take 0.5 tablets by mouth at bedtime as needed for pain. Half tab hs    . levothyroxine (LEVOXYL) 125 MCG tablet Take 125 mcg by mouth daily before breakfast.    . LORazepam (ATIVAN) 0.5 MG tablet Take 1.5 mg by mouth at bedtime.     . Multiple Vitamin (MULTIVITAMIN) tablet  Take 1 tablet by mouth daily.    . nebivolol (BYSTOLIC) 10 MG tablet Take 10 mg by mouth daily.    Marland Kitchen omeprazole (PRILOSEC) 20 MG capsule Take 20 mg by mouth daily as needed.    . rosuvastatin (CRESTOR) 20 MG tablet Take 20 mg by mouth daily.    . meclizine (ANTIVERT) 25 MG tablet Take 25 mg by mouth 3 (three) times daily as needed for dizziness.    . Probiotic Product (ALIGN) 4 MG CAPS Take 1 capsule (4 mg total) by mouth daily. (Patient not taking: Reported on 11/18/2014)    . psyllium (METAMUCIL SMOOTH TEXTURE) 28 % packet Take 1 packet by mouth at bedtime. (Patient not taking: Reported on 11/18/2014)     No current facility-administered medications on file prior to visit.        Objective:   Physical Exam   Blood pressure 108/62, pulse 72, temperature 98.1 F (36.7 C), height 5' (1.524 m), weight 128 lb (58.06 kg). Alert and oriented. Skin warm and dry. Oral mucosa is moist.   . Sclera anicteric, conjunctivae is pink. Thyroid not enlarged. No cervical lymphadenopathy. Lungs clear. Heart regular rate and rhythm.  Abdomen is soft. Bowel sounds are positive. No hepatomegaly. No abdominal masses felt. No tenderness.  No edema to lower extremities.        Assessment & Plan:  IBS. Increase fiber daily. Rx for Levsin .125mg  sl 30 minutes before meals.  PR in 2 weeks.  OV 6 months. She can talk to Dr.Nyland concerning Xanax as I  Generally do not prescribe this medication

## 2015-02-11 ENCOUNTER — Encounter: Payer: Self-pay | Admitting: Family

## 2015-02-18 ENCOUNTER — Ambulatory Visit: Payer: Self-pay | Admitting: Family

## 2015-02-18 ENCOUNTER — Encounter (HOSPITAL_COMMUNITY): Payer: Self-pay

## 2015-02-24 ENCOUNTER — Ambulatory Visit: Payer: Self-pay | Admitting: Family

## 2015-02-24 ENCOUNTER — Encounter (HOSPITAL_COMMUNITY): Payer: Self-pay

## 2015-05-18 ENCOUNTER — Ambulatory Visit (INDEPENDENT_AMBULATORY_CARE_PROVIDER_SITE_OTHER): Payer: Self-pay | Admitting: Internal Medicine

## 2015-05-21 ENCOUNTER — Encounter (INDEPENDENT_AMBULATORY_CARE_PROVIDER_SITE_OTHER): Payer: Self-pay | Admitting: Internal Medicine

## 2015-05-21 ENCOUNTER — Ambulatory Visit (INDEPENDENT_AMBULATORY_CARE_PROVIDER_SITE_OTHER): Payer: Medicare HMO | Admitting: Internal Medicine

## 2015-05-21 VITALS — BP 140/60 | HR 60 | Temp 97.7°F | Ht 60.0 in | Wt 124.6 lb

## 2015-05-21 DIAGNOSIS — K589 Irritable bowel syndrome without diarrhea: Secondary | ICD-10-CM

## 2015-05-21 MED ORDER — DICYCLOMINE HCL 10 MG PO CAPS: 10.0000 mg | ORAL_CAPSULE | Freq: Three times a day (TID) | ORAL | Status: DC

## 2015-05-21 NOTE — Progress Notes (Signed)
Subjective:    Patient ID: [name], female    DOB: December 25, 1945, 70 y.o.   MRN: [id number]  HPI She was last seen in September of 2016. Hx of IBS. Hx of diarrhea for 2 1/23 years. She was started on Dicyclomine by Dr. Laural Golden. Today her weight is 124.6 Her weight in September was 124.6.  She tells me some days she does not have a BM and some days she will have 4 stools. Her stools can be very loose. She has urgency and she has gas. No melena or BRRB.     05/08/2013 Colonoscopy  Indications: Patient is 70 year old Caucasian female who presents with chronic diarrhea. She possibly has IBS but not responding to therapy. She had single episode of hematochezia 3 weeks ago.  Impression:  Normal mucosa of terminal ileum. Small polyp ablated via cold biopsy from splenic flexure. Moderate sigmoid colon diverticulosis. Small external hemorrhoids. Random biopsies taken for mucosa of sigmoid colon looking for collagenous or microscopic colitis  Patient states diarrhea has resolved with dicyclomine. She had one small polyp which was tubular adenoma. Biopsy from sigmoid colon negative for microscopic or collagenous colitis. Next colonoscopy in 10 years. Report to PCP. She will need office visit in 3 months regarding IBS/diarrhea   Review of Systems Past Medical History  Diagnosis Date  . Hyperlipidemia   . Thyroid disease     Hypothyroidism  . Rheumatic fever   . Hepatitis   . Cerebrovascular occlusion ([place]) 2001    Extracranial with Hx of TIA's  . Myocardial infarction ([place])   . Carotid artery occlusion 05/08/2007  . Stroke ([place])   . Anxiety     Past Surgical History  Procedure Laterality Date  . Pr vein bypass graft,aorto-fem-pop  2001  . Carotid stent  2006  . Colonoscopy N/A 05/08/2013    Procedure: COLONOSCOPY;  Surgeon: Rogene Houston, MD;  [facility];  Service: Endoscopy;  Laterality: N/A;  230-moved to [place] notified pt  . Carotid endarterectomy   11/25/1999    S/P Left  . Carotid endarterectomy  03/24/2004    Left CCA stent- Dr. Amedeo Plenty    Allergies  Allergen Reactions  . Penicillins Swelling  . Morphine And Related Nausea And Vomiting    Current Outpatient Prescriptions on File Prior to Visit  Medication Sig Dispense Refill  . aspirin 81 MG tablet Take 81 mg by mouth daily.    . budesonide-formoterol (SYMBICORT) 80-4.5 MCG/ACT inhaler Inhale 2 puffs into the lungs daily.     Marland Kitchen CALCIUM PO Take 1 tablet by mouth daily.    . Cyanocobalamin (VITAMIN B 12 PO) Inject into the skin every 30 (thirty) days.     Marland Kitchen dicyclomine (BENTYL) 10 MG capsule Take 1 capsule (10 mg total) by mouth 2 (two) times daily. (Patient taking differently: Take 20 mg by mouth daily. ) 60 capsule 5  . fish oil-omega-3 fatty acids 1000 MG capsule Take 1,000 mg by mouth 3 (three) times daily.     Marland Kitchen HYDROcodone-acetaminophen (VICODIN) 5-500 MG per tablet Take 0.5 tablets by mouth at bedtime as needed for pain. Half tab hs    . hyoscyamine (LEVSIN SL) 0.125 MG SL tablet Place 1 tablet (0.125 mg total) under the tongue every 4 (four) hours as needed. 90 tablet 2  . levothyroxine (LEVOXYL) 125 MCG tablet Take 125 mcg by mouth daily before breakfast.    . LORazepam (ATIVAN) 0.5 MG tablet Take 1.5 mg by mouth at bedtime.     Marland Kitchen  meclizine (ANTIVERT) 25 MG tablet Take 25 mg by mouth 3 (three) times daily as needed for dizziness.    . Multiple Vitamin (MULTIVITAMIN) tablet Take 1 tablet by mouth daily.    . nebivolol (BYSTOLIC) 10 MG tablet Take 10 mg by mouth daily.    . Probiotic Product (ALIGN) 4 MG CAPS Take 1 capsule (4 mg total) by mouth daily.    Marland Kitchen omeprazole (PRILOSEC) 20 MG capsule Take 20 mg by mouth daily as needed.    . psyllium (METAMUCIL SMOOTH TEXTURE) 28 % packet Take 1 packet by mouth at bedtime. (Patient not taking: Reported on 11/18/2014)    . rosuvastatin (CRESTOR) 20 MG tablet Take 20 mg by mouth daily.     No current facility-administered medications on  file prior to visit.        Objective:   Physical ExamBlood pressure 140/60, pulse 60, temperature 97.7 F (36.5 C), height 5' (1.524 m), weight 124 lb 9.6 oz (56.518 kg). Alert and oriented. Skin warm and dry. Oral mucosa is moist.   . Sclera anicteric, conjunctivae is pink. Thyroid not enlarged. No cervical lymphadenopathy. Lungs clear. Heart regular rate and rhythm.  Abdomen is soft. Bowel sounds are positive. No hepatomegaly. No abdominal masses felt. No tenderness.  No edema to lower extremities. Patient is alert and oriented.        Assessment & Plan:  IBS. Am going to increase her Dicyclomine to Three times a day. Keep a stool diary x 2 weeks and bring to office.  OV in 6 months.

## 2015-05-21 NOTE — Patient Instructions (Addendum)
Dicyclomine TID. 10mg  . OV 6 months.  Keep a stool diary x 2 weeks and bring by office.

## 2015-10-12 ENCOUNTER — Encounter (HOSPITAL_COMMUNITY): Payer: Self-pay | Admitting: *Deleted

## 2015-10-12 ENCOUNTER — Emergency Department (HOSPITAL_COMMUNITY)
Admission: EM | Admit: 2015-10-12 | Discharge: 2015-10-12 | Disposition: A | Discharge status: Home / Self Care | Payer: Medicare HMO | Attending: Emergency Medicine | Admitting: Emergency Medicine

## 2015-10-12 ENCOUNTER — Emergency Department (HOSPITAL_COMMUNITY): Payer: Medicare HMO

## 2015-10-12 DIAGNOSIS — R002 Palpitations: Secondary | ICD-10-CM | POA: Diagnosis not present

## 2015-10-12 DIAGNOSIS — Z7982 Long term (current) use of aspirin: Secondary | ICD-10-CM | POA: Diagnosis not present

## 2015-10-12 DIAGNOSIS — I252 Old myocardial infarction: Secondary | ICD-10-CM | POA: Diagnosis not present

## 2015-10-12 DIAGNOSIS — E039 Hypothyroidism, unspecified: Secondary | ICD-10-CM | POA: Diagnosis not present

## 2015-10-12 DIAGNOSIS — F17210 Nicotine dependence, cigarettes, uncomplicated: Secondary | ICD-10-CM | POA: Diagnosis not present

## 2015-10-12 DIAGNOSIS — R0789 Other chest pain: Secondary | ICD-10-CM | POA: Diagnosis not present

## 2015-10-12 DIAGNOSIS — Z8673 Personal history of transient ischemic attack (TIA), and cerebral infarction without residual deficits: Secondary | ICD-10-CM | POA: Diagnosis not present

## 2015-10-12 DIAGNOSIS — Z79899 Other long term (current) drug therapy: Secondary | ICD-10-CM | POA: Insufficient documentation

## 2015-10-12 LAB — BASIC METABOLIC PANEL
Anion gap: 6 (ref 5–15)
BUN: 16 mg/dL (ref 6–20)
CALCIUM: 9.5 mg/dL (ref 8.9–10.3)
CHLORIDE: 107 mmol/L (ref 101–111)
CO2: 31 mmol/L (ref 22–32)
CREATININE: 0.76 mg/dL (ref 0.44–1.00)
GFR calc Af Amer: >60 mL/min (ref >60)
GFR calc non Af Amer: >60 mL/min (ref >60)
GLUCOSE: 92 mg/dL (ref 65–99)
Potassium: 4.7 mmol/L (ref 3.5–5.1)
Sodium: 144 mmol/L (ref 135–145)

## 2015-10-12 LAB — CBC
HCT: 37.3 % (ref 36.0–46.0)
HEMOGLOBIN: 11.5 g/dL — AB (ref 12.0–15.0)
MCH: 27.8 pg (ref 26.0–34.0)
MCHC: 30.8 g/dL (ref 30.0–36.0)
MCV: 90.1 fL (ref 78.0–100.0)
PLATELETS: 214 10*3/uL (ref 150–400)
RBC: 4.14 MIL/uL (ref 3.87–5.11)
RDW: 14 % (ref 11.5–15.5)
WBC: 7.4 10*3/uL (ref 4.0–10.5)

## 2015-10-12 LAB — I-STAT TROPONIN, ED: TROPONIN I, POC: 0 ng/mL (ref 0.00–0.08)

## 2015-10-12 MED ORDER — NEBIVOLOL HCL 5 MG PO TABS: 5.0000 mg | ORAL_TABLET | Freq: Every day | ORAL | 0 refills | Status: DC

## 2015-10-12 NOTE — Discharge Instructions (Signed)
It was our pleasure to provide your ER care today - we hope that you feel better.  Your heart rate is slow today - decrease your bystolic medication dose from 10 mg a day to 5 mg a day.  Follow up with cardiologist in the coming week - see referral - call office to arrange appointment.  Return to ER if worse, persistent fast heart beat, fainting, chest pain, trouble breathing, other concern.

## 2015-10-12 NOTE — ED Provider Notes (Addendum)
[facility] DEPT Provider Note   CSN: [id number] Arrival date & time: 10/12/15  1030     History   Chief Complaint Chief Complaint  Patient presents with  . Chest Pain  . Emesis  . Altered Mental Status    HPI [name] is a 70 y.o. female.  Patient c/o intermittent palpitations in the past 2-3 days.  Occurs at rest. Episodic.  Lasts 2-3 seconds per episode. No prolonged rapid or irregular heart beat. No syncope. Denies associated chest pain. No sob or unusual doe. Hx carotid occlusive/vascular disease. Denies hx cad or dysrhythmia. No recent change in meds except asa dose was increased. Denies fever or chills. 2 days ago had episode nv, but no current nausea, no emesis today.       The history is provided by the patient.  Chest Pain   Associated symptoms include vomiting. Pertinent negatives include no abdominal pain, no back pain, no fever, no headaches, no numbness, no shortness of breath and no weakness.  Emesis   Pertinent negatives include no abdominal pain, no fever and no headaches.  Altered Mental Status   Pertinent negatives include no confusion and no weakness.    Past Medical History:  Diagnosis Date  . Anxiety   . Carotid artery occlusion 05/08/2007  . Cerebrovascular occlusion ([place]) 2001   Extracranial with Hx of TIA's  . Hepatitis   . Hyperlipidemia   . Myocardial infarction ([place])   . Rheumatic fever   . Stroke ([place])   . Thyroid disease    Hypothyroidism    Patient Active Problem List   Diagnosis Date Noted  . IBS (irritable bowel syndrome) 09/17/2013  . Diarrhea 04/24/2013  . Aftercare following surgery of the circulatory system, [place] 01/30/2013  . Peripheral vascular disease, unspecified ([place]) 01/30/2013  . Pain in limb 01/02/2012  . Occlusion and stenosis of carotid artery without mention of cerebral infarction 06/27/2011    Past Surgical History:  Procedure Laterality Date  . CAROTID ENDARTERECTOMY  11/25/1999   S/P Left  .  CAROTID ENDARTERECTOMY  03/24/2004   Left CCA stent- Dr. Amedeo Plenty  . CAROTID STENT  2006  . COLONOSCOPY N/A 05/08/2013   Procedure: COLONOSCOPY;  Surgeon: Rogene Houston, MD;  Location[facility];  Service: Endoscopy;  Laterality: N/A;  230-moved to [place] notified pt  . PR VEIN BYPASS GRAFT,AORTO-FEM-POP  2001    OB History    No data available       Home Medications    Prior to Admission medications   Medication Sig Start Date End Date Taking? Authorizing Provider  aspirin 325 MG tablet Take 325 mg by mouth daily.   Yes Historical Provider, MD  budesonide-formoterol (SYMBICORT) 80-4.5 MCG/ACT inhaler Inhale 1 puff into the lungs daily.    Yes Historical Provider, MD  cyanocobalamin (,VITAMIN B-12,) 1000 MCG/ML injection Inject 1,000 mcg into the muscle every 30 (thirty) days.   Yes Historical Provider, MD  dicyclomine (BENTYL) 20 MG tablet Take 20 mg by mouth 2 (two) times daily.   Yes Historical Provider, MD  HYDROcodone-acetaminophen (NORCO/VICODIN) 5-325 MG tablet Take 0.5 tablets by mouth every 6 (six) hours as needed for moderate pain.   Yes Historical Provider, MD  levothyroxine (LEVOXYL) 125 MCG tablet Take 125 mcg by mouth daily before breakfast.   Yes Historical Provider, MD  LORazepam (ATIVAN) 0.5 MG tablet Take 1.5 mg by mouth at bedtime.    Yes Historical Provider, MD  Multiple Vitamin (MULTIVITAMIN) tablet Take 1  tablet by mouth daily.   Yes Historical Provider, MD  nebivolol (BYSTOLIC) 10 MG tablet Take 10 mg by mouth daily.   Yes Historical Provider, MD  rosuvastatin (CRESTOR) 20 MG tablet Take 20 mg by mouth daily.   Yes Historical Provider, MD  dicyclomine (BENTYL) 10 MG capsule Take 1 capsule (10 mg total) by mouth 3 (three) times daily before meals. Please deliver Patient not taking: Reported on 10/12/2015 05/21/15   Butch Penny, NP  hyoscyamine (LEVSIN SL) 0.125 MG SL tablet Place 1 tablet (0.125 mg total) under the tongue every 4 (four) hours as needed. Patient  not taking: Reported on 10/12/2015 11/18/14   Butch Penny, NP  Probiotic Product (ALIGN) 4 MG CAPS Take 1 capsule (4 mg total) by mouth daily. Patient not taking: Reported on 10/12/2015 05/08/13   Rogene Houston, MD  psyllium (METAMUCIL SMOOTH TEXTURE) 28 % packet Take 1 packet by mouth at bedtime. Patient not taking: Reported on 11/18/2014 05/08/13   Rogene Houston, MD    Family History Family History  Problem Relation Age of Onset  . Cancer Sister   . Hypertension Father   . Heart attack Mother   . Hyperlipidemia Mother   . Kidney disease Brother   . Cancer Brother   . Cancer Brother     Lung   . Colon cancer Neg Hx     Social History Social History  Substance Use Topics  . Smoking status: Current Every Day Smoker    Packs/day: 0.25    Years: 40.00    Types: Cigarettes  . Smokeless tobacco: Never Used     Comment: 1 pack every 3 days x 65yrs  . Alcohol use No     Allergies   Penicillins and Morphine and related   Review of Systems Review of Systems  Constitutional: Negative for fever.  HENT: Negative for sore throat.   Eyes: Negative for visual disturbance.  Respiratory: Negative for shortness of breath.   Cardiovascular: Positive for chest pain.  Gastrointestinal: Positive for vomiting. Negative for abdominal pain.  Genitourinary: Negative for dysuria and flank pain.  Musculoskeletal: Negative for back pain and neck pain.  Skin: Negative for rash.  Neurological: Negative for weakness, numbness and headaches.  Hematological: Does not bruise/bleed easily.  Psychiatric/Behavioral: Negative for confusion.     Physical Exam Updated Vital Signs BP 177/64 (BP Location: Left Arm)   Pulse (!) 55   Temp 97.9 F (36.6 C) (Oral)   Resp 18   SpO2 97%   Physical Exam  Constitutional: She appears well-developed and well-nourished. No distress.  HENT:  Mouth/Throat: Oropharynx is clear and moist.  Eyes: Conjunctivae are normal. No scleral icterus.  Neck: Neck  supple. No tracheal deviation present. No thyromegaly present.  Cardiovascular: Normal rate, regular rhythm, normal heart sounds and intact distal pulses.   No murmur heard. Pulmonary/Chest: Effort normal and breath sounds normal. No respiratory distress.  Abdominal: Soft. Normal appearance and bowel sounds are normal. She exhibits no distension. There is no tenderness.  Genitourinary:  Genitourinary Comments: No cva tenderness  Musculoskeletal: She exhibits no edema or tenderness.  Neurological: She is alert.  Skin: Skin is warm and dry. No rash noted. She is not diaphoretic.  Psychiatric: She has a normal mood and affect.  Nursing note and vitals reviewed.    ED Treatments / Results  Labs (all labs ordered are listed, but only abnormal results are displayed) Results for orders placed or performed during the hospital encounter  of [id number]  Basic metabolic panel  Result Value Ref Range   Sodium 144 135 - 145 mmol/L   Potassium 4.7 3.5 - 5.1 mmol/L   Chloride 107 101 - 111 mmol/L   CO2 31 22 - 32 mmol/L   Glucose, Bld 92 65 - 99 mg/dL   BUN 16 6 - 20 mg/dL   Creatinine, Ser 0.76 0.44 - 1.00 mg/dL   Calcium 9.5 8.9 - 10.3 mg/dL   GFR calc non Af Amer >60 >60 mL/min   GFR calc Af Amer >60 >60 mL/min   Anion gap 6 5 - 15  CBC  Result Value Ref Range   WBC 7.4 4.0 - 10.5 K/uL   RBC 4.14 3.87 - 5.11 MIL/uL   Hemoglobin 11.5 (L) 12.0 - 15.0 g/dL   HCT 37.3 36.0 - 46.0 %   MCV 90.1 78.0 - 100.0 fL   MCH 27.8 26.0 - 34.0 pg   MCHC 30.8 30.0 - 36.0 g/dL   RDW 14.0 11.5 - 15.5 %   Platelets 214 150 - 400 K/uL  I-stat troponin, ED  Result Value Ref Range   Troponin i, poc 0.00 0.00 - 0.08 ng/mL   Comment 3           Dg Chest 2 View  Result Date: 10/12/2015 CLINICAL DATA:  Chest pain.  Palpitations. EXAM: CHEST  2 VIEW COMPARISON:  07/25/2014 FINDINGS: The cardiac silhouette is normal. Mediastinal contours appear intact. Calcific atherosclerotic disease of the aorta noted. There  is no evidence of focal airspace consolidation, pleural effusion or pneumothorax. Osseous structures are without acute abnormality. Soft tissues are grossly normal. IMPRESSION: No active cardiopulmonary disease. Electronically Signed   By: Fidela Salisbury M.D.   On: 10/12/2015 12:18    EKG  EKG Interpretation  Date/Time:  Monday October 12 2015 10:33:11 EDT Ventricular Rate:  62 PR Interval:  202 QRS Duration: 78 QT Interval:  400 QTC Calculation: 406 R Axis:   84 Text Interpretation:  Sinus rhythm with Premature atrial complexes Otherwise normal ECG Confirmed by Ashok Cordia  MD, Lennette Bihari ([id number]) on 10/12/2015 11:43:11 AM       Radiology No results found.  Procedures Procedures (including critical care time)  Medications Ordered in ED Medications - No data to display   Initial Impression / Assessment and Plan / ED Course  I have reviewed the triage vital signs and the nursing notes.  Pertinent labs & imaging results that were available during my care of the patient were reviewed by me and considered in my medical decision making (see chart for details).  Clinical Course   Continuous cardiac monitor.    Ecg. Labs.  After symptoms recurrently for couple of days, trop 0/negative. No chest pain.    Remains in sinus rhythm on monitor.  Hr as low as mid to low 50's, sinus, is on bblocker rx.   Patient remains in sinus rhythm, no chest pain, no syncope - currently appears stable for d/c.     Final Clinical Impressions(s) / ED Diagnoses   Final diagnoses:  None    New Prescriptions New Prescriptions   No medications on file         Lajean Saver, MD 10/12/15 1320

## 2015-10-12 NOTE — ED Triage Notes (Signed)
Pt reports intermittent chest pains, pressure, palpitations. Had episodes of n/v and htn. Also reports episodes of confusion and slurred speech over past several days. ekg done and airway intact at triage.

## 2015-11-06 ENCOUNTER — Encounter: Payer: Self-pay | Admitting: Cardiovascular Disease

## 2015-11-09 ENCOUNTER — Ambulatory Visit: Payer: Medicare HMO | Admitting: Cardiovascular Disease

## 2015-11-17 ENCOUNTER — Ambulatory Visit: Payer: Self-pay | Admitting: Cardiology

## 2015-11-23 ENCOUNTER — Ambulatory Visit (INDEPENDENT_AMBULATORY_CARE_PROVIDER_SITE_OTHER): Payer: Self-pay | Admitting: Internal Medicine

## 2015-11-24 ENCOUNTER — Encounter (INDEPENDENT_AMBULATORY_CARE_PROVIDER_SITE_OTHER): Payer: Self-pay | Admitting: Internal Medicine

## 2015-12-09 ENCOUNTER — Encounter (INDEPENDENT_AMBULATORY_CARE_PROVIDER_SITE_OTHER): Payer: Self-pay

## 2015-12-09 ENCOUNTER — Ambulatory Visit (INDEPENDENT_AMBULATORY_CARE_PROVIDER_SITE_OTHER): Payer: Medicare HMO | Admitting: Cardiovascular Disease

## 2015-12-09 ENCOUNTER — Encounter: Payer: Self-pay | Admitting: Cardiovascular Disease

## 2015-12-09 VITALS — BP 130/60 | HR 70 | Ht 60.0 in | Wt 124.6 lb

## 2015-12-09 DIAGNOSIS — I2510 Atherosclerotic heart disease of native coronary artery without angina pectoris: Secondary | ICD-10-CM

## 2015-12-09 DIAGNOSIS — I739 Peripheral vascular disease, unspecified: Secondary | ICD-10-CM | POA: Diagnosis not present

## 2015-12-09 DIAGNOSIS — R002 Palpitations: Secondary | ICD-10-CM | POA: Diagnosis not present

## 2015-12-09 DIAGNOSIS — E785 Hyperlipidemia, unspecified: Secondary | ICD-10-CM | POA: Diagnosis not present

## 2015-12-09 NOTE — Patient Instructions (Signed)
Medication Instructions:  Your physician recommends that you continue on your current medications as directed. Please refer to the Current Medication list given to you today.   Labwork: None Ordered   Testing/Procedures: Your physician has recommended that you wear an event monitor. Event monitors are medical devices that record the heart's electrical activity. Doctors most often us these monitors to diagnose arrhythmias. Arrhythmias are problems with the speed or rhythm of the heartbeat. The monitor is a small, portable device. You can wear one while you do your normal daily activities. This is usually used to diagnose what is causing palpitations/syncope (passing out).   Follow-Up: Your physician recommends that you schedule a follow-up appointment in: 3 months with Dr. Nahser   If you need a refill on your cardiac medications before your next appointment, please call your pharmacy.   Thank you for choosing [facility]! Dakarai Mcglocklin, RN [phone]    

## 2015-12-09 NOTE — Progress Notes (Signed)
Cardiology Office Note   Date:  12/09/2015   ID:  [name], DOB 24-Jul-1945, MRN [id number]  PCP:  Sherrie Mustache, MD  Cardiologist:   Mertie Moores, MD   Chief Complaint  Patient presents with  . Palpitations   1. Palpitations 2. PVD - s/p Left carotid endarectomy   3. . Hypothyroidism 4. . Hyperlipidemia 5. . Rheumatic fever    Oct. 18, 2017  [name] is a 70 y.o. female who presents for evaluation of heart palpitations  .She has a history of  ?  coronary artery disease. She has seen someone  in the practice many years ago but does not recall what was. (She thinks that she has had coronary artery bypass grafting but she does not have any evidence of surgery in the form of the scar and her chest x-ray does not show a sternotomy). She also has a history of significant peripheral vascular disease. She continues to smoke.   Was started on bystolic years ago This caused some palpitation  Was seen in the ER with HTN HR was 62 in the ER,   BP was 177/64.  Dr. Octaviano Batty decreased the Bystolic to 5 mg  She stopped taking It completely. Since that time she's had lots of tremulousness. She also has noted that her heart rate is much faster and she's having worse palpitations.  She still has occasional episodes of heart racing, disorientation,  Past several months her vital signs have gradually normalized and she's not having all the tremulousness and hard heart pounding.  He denies any angina. She does have some shortness of breath with exertion.  Her daughters accompanied her to the office visit today. The patient does not drive. She insists that she is very busy but her daughters, that she really does not go anywhere.  Past Medical History:  Diagnosis Date  . Anxiety   . Carotid artery occlusion 05/08/2007  . 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 Hepatitis   . Hyperlipidemia   . Myocardial infarction   . Rheumatic fever   .  Stroke ([place])   . Thyroid disease    Hypothyroidism    Past Surgical History:  Procedure Laterality Date  . CAROTID ENDARTERECTOMY  11/25/1999   S/P Left  . CAROTID ENDARTERECTOMY  03/24/2004   Left CCA stent- Dr. Amedeo Plenty  . CAROTID STENT  2006  . COLONOSCOPY N/A 05/08/2013   Procedure: COLONOSCOPY;  Surgeon: Rogene Houston, MD;  [facility];  Service: Endoscopy;  Laterality: N/A;  230-moved to [place] notified pt  . PR VEIN BYPASS GRAFT,AORTO-FEM-POP  2001     Current Outpatient Prescriptions  Medication Sig Dispense Refill  . aspirin 325 MG tablet Take 325 mg by mouth daily.    . budesonide-formoterol (SYMBICORT) 80-4.5 MCG/ACT inhaler Inhale 1 puff into the lungs daily.     . cyanocobalamin (,VITAMIN B-12,) 1000 MCG/ML injection Inject 1,000 mcg into the muscle every 30 (thirty) days.    Marland Kitchen dicyclomine (BENTYL) 20 MG tablet Take 20 mg by mouth 2 (two) times daily.    Marland Kitchen HYDROcodone-acetaminophen (NORCO/VICODIN) 5-325 MG tablet Take 0.5 tablets by mouth every 6 (six) hours as needed for moderate pain.    . hyoscyamine (LEVSIN SL) 0.125 MG SL tablet Place 1 tablet (0.125 mg total) under the tongue every 4 (four) hours as needed. 90 tablet 2  . levothyroxine (LEVOXYL) 125 MCG tablet Take 125 mcg by mouth daily before breakfast.    .  LORazepam (ATIVAN) 0.5 MG tablet Take 1.5 mg by mouth at bedtime.     . nebivolol (BYSTOLIC) 5 MG tablet Take 1 tablet (5 mg total) by mouth daily. 30 tablet 0  . rosuvastatin (CRESTOR) 20 MG tablet Take 20 mg by mouth daily.     No current facility-administered medications for this visit.     Allergies:   Penicillins and Morphine and related    Social History:  The patient  reports that she has been smoking Cigarettes.  She has a 10.00 pack-year smoking history. She has never used smokeless tobacco. She reports that she does not drink alcohol or use drugs.   Family History:  The patient's family history includes Cancer in her brother, brother,  and sister; Heart attack in her mother; Hyperlipidemia in her mother; Hypertension in her father; Kidney disease in her brother.    ROS:  Please see the history of present illness.    Review of Systems: Constitutional:  denies fever, chills, diaphoresis, appetite change and fatigue.  HEENT: denies photophobia, eye pain, redness, hearing loss, ear pain, congestion, sore throat, rhinorrhea, sneezing, neck pain, neck stiffness and tinnitus.  Respiratory: denies SOB, DOE, cough, chest tightness, and wheezing.  Cardiovascular: denies chest pain, .  She has had palpitations .   Denies leg swelling.  Gastrointestinal: denies nausea, vomiting, abdominal pain, diarrhea, constipation, blood in stool.  Genitourinary: denies dysuria, urgency, frequency, hematuria, flank pain and difficulty urinating.  Musculoskeletal: denies  myalgias, back pain, joint swelling, arthralgias and gait problem.   Skin: denies pallor, rash and wound.  Neurological: denies dizziness, seizures, syncope, weakness, light-headedness, numbness and headaches.   Hematological: denies adenopathy, easy bruising, personal or family bleeding history.  Psychiatric/ Behavioral: denies suicidal ideation, mood changes, confusion, nervousness, sleep disturbance and agitation.       All other systems are reviewed and negative.    PHYSICAL EXAM: VS:  BP 130/60 (BP Location: Left Arm, Patient Position: Sitting, Cuff Size: Normal)   Pulse 70   Ht 5' (1.524 m)   Wt 124 lb 9.6 oz (56.5 kg)   BMI 24.33 kg/m  , BMI Body mass index is 24.33 kg/m. GEN: Well nourished, well developed, in no acute distress  HEENT: normal  Neck: no JVD,  Left CEA scar,   Soft left carotid bruit  Cardiac: RRR;  Soft murmur , rubs, or gallops,no edema  Respiratory:  clear to auscultation bilaterally, normal work of breathing GI: soft, nontender, nondistended, + BS MS: no deformity or atrophy  Skin: warm and dry, no rash Neuro:  Strength and sensation are  intact Psych: normal   EKG:  EKG is not ordered today. The ekg ordered 10/14/15  demonstrates normal sinus rhythm at 62. She has nonspecific ST changes. She has occasional premature atrial contractions.   Recent Labs: 10/12/2015: BUN 16; Creatinine, Ser 0.76; Hemoglobin 11.5; Platelets 214; Potassium 4.7; Sodium 144    Lipid Panel No results found for: CHOL, TRIG, HDL, CHOLHDL, VLDL, LDLCALC, LDLDIRECT    Wt Readings from Last 3 Encounters:  12/09/15 124 lb 9.6 oz (56.5 kg)  05/21/15 124 lb 9.6 oz (56.5 kg)  11/18/14 128 lb (58.1 kg)      Other studies Reviewed: Additional studies/ records that were reviewed today include: . Review of the above records demonstrates:    ASSESSMENT AND PLAN:  1.  Palpitations: She had increased palpitations that she thought was associated originally with the diastolic therapy but when she stopped diastolic the palpitations acutely worsened. She  was anxious and nervous and tachycardic. We discussed the fact that this was a normal history of someone who has stopped the beta blocker.  These have gradually resolved over the past 2 months. Her heart rate and blood pressure are fairly well-controlled at this point.  I suggested to her that her palpitations are more likely to be due to a primary arrhythmia and not due to the BioStop therapy. She may be having episodes of atrial fibrillation. We'll place a 30 day event monitor on her for further evaluation.  2. Hypertension: Patient has intermittent episodes of very elevated blood pressure. She eats salty foods all day long and sprinkles lots of salt on her dinners. She continues to smoke. We discussed all of these issues. I've advised her to take better care of herself and to stop smoking. She needs to eat a more balanced diet and to eliminate most of her salt intake. I'll see her back in 3 months for follow-up visit.  3. Peripheral vascular disease: Patient has had peripheral vascular surgery. She'll need  to follow-up with vein and basilar specialist at some point.  I've advised her to stop smoking. Current medicines are reviewed at length with the patient today.  The patient does not have concerns regarding medicines.  Labs/ tests ordered today include:  No orders of the defined types were placed in this encounter.    Disposition:   FU with me in 3 months      Mertie Moores, MD  12/09/2015 4:15 PM    [facility] [place], [place], [place]  [address] Phone: [phone]; Fax: [phone]   [facility]  [address] [place], [place]  [address] [phone]   Fax [phone]

## 2015-12-22 ENCOUNTER — Other Ambulatory Visit: Payer: Self-pay | Admitting: Cardiovascular Disease

## 2015-12-22 ENCOUNTER — Ambulatory Visit (INDEPENDENT_AMBULATORY_CARE_PROVIDER_SITE_OTHER): Payer: Medicare HMO

## 2015-12-22 DIAGNOSIS — R002 Palpitations: Secondary | ICD-10-CM | POA: Diagnosis not present

## 2015-12-22 DIAGNOSIS — R000 Tachycardia, unspecified: Secondary | ICD-10-CM

## 2016-01-28 ENCOUNTER — Telehealth: Payer: Self-pay | Admitting: Nurse Practitioner

## 2016-01-28 MED ORDER — APIXABAN 5 MG PO TABS: 5.0000 mg | ORAL_TABLET | Freq: Two times a day (BID) | ORAL | 11 refills | Status: AC

## 2016-01-28 MED ORDER — NEBIVOLOL HCL 5 MG PO TABS: 5.0000 mg | ORAL_TABLET | Freq: Every day | ORAL | 11 refills | Status: DC

## 2016-01-28 NOTE — Telephone Encounter (Signed)
Results of cardiac monitor reviewed with patient.  I explained the need to start eliquis and to continue bystolic.  She states she stopped the bystolic a couple of months ago.  I advised her to restart at 5 mg daily.  I advised she monitor for burden of symptoms and that she is scheduled for follow-up with Dr. Acie Fredrickson in January.  I advised we will discuss treatment options based on how she is feeling and how burdensome symptoms are for her.  I advised her to call the office prior to follow-up with questions or concerns.  She verbalized understanding and agreement and thanked me for the call.

## 2016-01-28 NOTE — Telephone Encounter (Signed)
-----   Message from Thayer Headings, MD sent at 01/28/2016  6:18 PM EST ----- Has runs of PAF.  Start Eliquis 5 BID Verify that she is on Bystolic.   We can also use Propranolol 10 mg as needed for palpitations  Keep of in jan .

## 2016-02-04 ENCOUNTER — Telehealth: Payer: Self-pay | Admitting: Cardiovascular Disease

## 2016-02-04 MED ORDER — CARVEDILOL 6.25 MG PO TABS: 6.2500 mg | ORAL_TABLET | Freq: Two times a day (BID) | ORAL | 11 refills | Status: AC

## 2016-02-04 NOTE — Telephone Encounter (Signed)
New message   Pt c/o medication issue:  1. Name of Medication: diastolic 5mg   2. How are you currently taking this medication (dosage and times per day)? 1xday  3. Are you having a reaction (difficulty breathing--STAT)? no  4. What is your medication issue? Making her have lose bm affecting her IBS

## 2016-02-04 NOTE — Telephone Encounter (Signed)
Spoke with patient who states she is having problems with diarrhea since restarting bystolic.  She has hx of IBS and can hardly make it to the bathroom since restarting the bystolic on [id number] for atrial fib. I advised that per Dr. Acie Fredrickson, we can stop bystolic and have her start carvedilol 6.25 mg twice daily.  She states she will be traveling on Saturday and is concerned that she will continue to have diarrhea.  I advised her to stop the bystolic today and start carvedilol on Sunday morning after she returns home.  I advised her to call next week to let us know how she is doing. She verbalized understanding and agreement and thanked me for the call.

## 2016-02-04 NOTE — Telephone Encounter (Signed)
Follow up ° °Pt verbalized that she is returning call for the rn  °

## 2016-03-08 ENCOUNTER — Encounter (INDEPENDENT_AMBULATORY_CARE_PROVIDER_SITE_OTHER): Payer: Self-pay

## 2016-03-08 ENCOUNTER — Encounter: Payer: Self-pay | Admitting: Cardiovascular Disease

## 2016-03-08 ENCOUNTER — Ambulatory Visit (INDEPENDENT_AMBULATORY_CARE_PROVIDER_SITE_OTHER): Payer: Medicare HMO | Admitting: Cardiovascular Disease

## 2016-03-08 VITALS — BP 164/82 | HR 61 | Ht 60.0 in | Wt 120.8 lb

## 2016-03-08 DIAGNOSIS — I10 Essential (primary) hypertension: Secondary | ICD-10-CM | POA: Diagnosis not present

## 2016-03-08 DIAGNOSIS — I480 Paroxysmal atrial fibrillation: Secondary | ICD-10-CM | POA: Diagnosis not present

## 2016-03-08 MED ORDER — LOSARTAN POTASSIUM 50 MG PO TABS: 50.0000 mg | ORAL_TABLET | Freq: Every day | ORAL | 11 refills | Status: DC

## 2016-03-08 NOTE — Progress Notes (Signed)
Cardiology Office Note   Date:  03/08/2016   ID:  Massie Bougie, DOB 1945/04/13, MRN [id number]  PCP:  Sherrie Mustache, MD  Cardiologist:   Mertie Moores, MD   Chief Complaint  Patient presents with  . Palpitations   1. Palpitations 2. PVD - s/p Left carotid endarectomy   3. Hypothyroidism 4. Hyperlipidemia 5. Rheumatic fever  6. Paroxysmal atrial fib   Oct. 18, 2017  [name] is a 71 y.o. female who presents for evaluation of heart palpitations  .She has a history of  ?  coronary artery disease. She has seen someone  in the practice many years ago but does not recall what was. (She thinks that she has had coronary artery bypass grafting but she does not have any evidence of surgery in the form of the scar and her chest x-ray does not show a sternotomy). She also has a history of significant peripheral vascular disease. She continues to smoke.   Was started on bystolic years ago This caused some palpitation  Was seen in the ER with HTN HR was 62 in the ER,   BP was 177/64.  Dr. Octaviano Batty decreased the Bystolic to 5 mg  She stopped taking It completely. Since that time she's had lots of tremulousness. She also has noted that her heart rate is much faster and she's having worse palpitations.  She still has occasional episodes of heart racing, disorientation,  Past several months her vital signs have gradually normalized and she's not having all the tremulousness and hard heart pounding.  He denies any angina. She does have some shortness of breath with exertion.  Her daughters accompanied her to the office visit today. The patient does not drive. She insists that she is very busy but her daughters, that she really does not go anywhere.  Jan. 16, 2018:  [name] was found to have PAF with her event monitor.She was started on Eliquis 5 mg twice a day. She is also now on carvedilol for rate control.   The Bystolic caused diarrhea.   The    Past Medical History:    Diagnosis Date  . Anxiety   . Carotid artery occlusion 05/08/2007  . 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 Hepatitis   . Hyperlipidemia   . Myocardial infarction   . Rheumatic fever   . Stroke ([place])   . Thyroid disease    Hypothyroidism    Past Surgical History:  Procedure Laterality Date  . CAROTID ENDARTERECTOMY  11/25/1999   S/P Left  . CAROTID ENDARTERECTOMY  03/24/2004   Left CCA stent- Dr. Amedeo Plenty  . CAROTID STENT  2006  . COLONOSCOPY N/A 05/08/2013   Procedure: COLONOSCOPY;  Surgeon: Rogene Houston, MD;  [facility];  Service: Endoscopy;  Laterality: N/A;  230-moved to [place] notified pt  . PR VEIN BYPASS GRAFT,AORTO-FEM-POP  2001     Current Outpatient Prescriptions  Medication Sig Dispense Refill  . apixaban (ELIQUIS) 5 MG TABS tablet Take 1 tablet (5 mg total) by mouth 2 (two) times daily. 60 tablet 11  . budesonide-formoterol (SYMBICORT) 80-4.5 MCG/ACT inhaler Inhale 1 puff into the lungs daily.     . carvedilol (COREG) 6.25 MG tablet Take 1 tablet (6.25 mg total) by mouth 2 (two) times daily. 60 tablet 11  . cyanocobalamin (,VITAMIN B-12,) 1000 MCG/ML injection Inject 1,000 mcg into the muscle every 30 (thirty) days.    Marland Kitchen dicyclomine (BENTYL) 20 MG  tablet Take 20 mg by mouth 2 (two) times daily.    Marland Kitchen HYDROcodone-acetaminophen (NORCO/VICODIN) 5-325 MG tablet Take 0.5 tablets by mouth every 6 (six) hours as needed for moderate pain.    . hyoscyamine (LEVSIN SL) 0.125 MG SL tablet Place 1 tablet (0.125 mg total) under the tongue every 4 (four) hours as needed. 90 tablet 2  . levothyroxine (LEVOXYL) 125 MCG tablet Take 125 mcg by mouth daily before breakfast.    . LORazepam (ATIVAN) 0.5 MG tablet Take 1.5 mg by mouth at bedtime.     . rosuvastatin (CRESTOR) 20 MG tablet Take 20 mg by mouth daily.     No current facility-administered medications for this visit.     Allergies:   Penicillins and Morphine and related    Social  History:  The patient  reports that she has been smoking Cigarettes.  She has a 10.00 pack-year smoking history. She has never used smokeless tobacco. She reports that she does not drink alcohol or use drugs.   Family History:  The patient's family history includes Cancer in her brother, brother, and sister; Heart attack in her mother; Hyperlipidemia in her mother; Hypertension in her father; Kidney disease in her brother.    ROS:  Please see the history of present illness.    Review of Systems: Constitutional:  denies fever, chills, diaphoresis, appetite change and fatigue.  HEENT: denies photophobia, eye pain, redness, hearing loss, ear pain, congestion, sore throat, rhinorrhea, sneezing, neck pain, neck stiffness and tinnitus.  Respiratory: denies SOB, DOE, cough, chest tightness, and wheezing.  Cardiovascular: denies chest pain, .  She has had palpitations .   Denies leg swelling.  Gastrointestinal: denies nausea, vomiting, abdominal pain, diarrhea, constipation, blood in stool.  Genitourinary: denies dysuria, urgency, frequency, hematuria, flank pain and difficulty urinating.  Musculoskeletal: denies  myalgias, back pain, joint swelling, arthralgias and gait problem.   Skin: denies pallor, rash and wound.  Neurological: denies dizziness, seizures, syncope, weakness, light-headedness, numbness and headaches.   Hematological: denies adenopathy, easy bruising, personal or family bleeding history.  Psychiatric/ Behavioral: denies suicidal ideation, mood changes, confusion, nervousness, sleep disturbance and agitation.       All other systems are reviewed and negative.    PHYSICAL EXAM: VS:  BP (!) 164/82 (BP Location: Left Arm, Patient Position: Sitting, Cuff Size: Normal)   Pulse 61   Ht 5' (1.524 m)   Wt 120 lb 12.8 oz (54.8 kg)   SpO2 99%   BMI 23.59 kg/m  , BMI Body mass index is 23.59 kg/m. GEN: Well nourished, well developed, in no acute distress  HEENT: normal  Neck: no  JVD,  Left CEA scar,   Soft Bilateral carotid bruits.  Cardiac: RRR;  Soft murmur , rubs, or gallops,no edema  Respiratory:  clear to auscultation bilaterally, normal work of breathing GI: soft, nontender, nondistended, + BS MS: no deformity or atrophy  Skin: warm and dry, no rash Neuro:  Strength and sensation are intact Psych: normal   EKG:  EKG is not ordered today. The ekg ordered 10/14/15  demonstrates normal sinus rhythm at 62. She has nonspecific ST changes. She has occasional premature atrial contractions.   Recent Labs: 10/12/2015: BUN 16; Creatinine, Ser 0.76; Hemoglobin 11.5; Platelets 214; Potassium 4.7; Sodium 144    Lipid Panel No results found for: CHOL, TRIG, HDL, CHOLHDL, VLDL, LDLCALC, LDLDIRECT    Wt Readings from Last 3 Encounters:  03/08/16 120 lb 12.8 oz (54.8 kg)  12/09/15 124 lb  9.6 oz (56.5 kg)  05/21/15 124 lb 9.6 oz (56.5 kg)      Other studies Reviewed: Additional studies/ records that were reviewed today include: . Review of the above records demonstrates:    ASSESSMENT AND PLAN:  1.  Paroxysmal atrial fibrillation: Her event monitor showed paroxysmal atrial fibrillation. She's on Eliquis. She's on carvedilol for rate control. Continue current meds    2. Hypertension: Her blood pressure remains elevated. We will add losartan 50 mg a day. We will check a basic medical profile in 3 weeks.  3. Peripheral vascular disease: Patient has had peripheral vascular surgery. She'll need to follow-up with vein and vascular specialist at some point.  I've advised her to stop smoking. Current medicines are reviewed at length with the patient today.  The patient does not have concerns regarding medicines.  Labs/ tests ordered today include:  No orders of the defined types were placed in this encounter.    Disposition:   FU with me in 6 months      Mertie Moores, MD  03/08/2016 8:53 AM    [facility] [place],  [place], [place]  [address] Phone: [phone]; Fax: [phone]

## 2016-03-08 NOTE — Patient Instructions (Signed)
Medication Instructions:  START Losartan 50 mg once daily   Labwork: Your physician recommends that you return for lab work in: 3 weeks for basic metabolic panel   Testing/Procedures: None Ordered   Follow-Up: Your physician wants you to follow-up in: 6 months with Dr. Nahser.  You will receive a reminder letter in the mail two months in advance. If you don't receive a letter, please call our office to schedule the follow-up appointment.   If you need a refill on your cardiac medications before your next appointment, please call your pharmacy.   Thank you for choosing [facility]! Gerardine Peltz, RN [phone]    

## 2016-03-09 DIAGNOSIS — I10 Essential (primary) hypertension: Secondary | ICD-10-CM | POA: Insufficient documentation

## 2016-03-09 DIAGNOSIS — I480 Paroxysmal atrial fibrillation: Secondary | ICD-10-CM | POA: Insufficient documentation

## 2016-03-14 ENCOUNTER — Telehealth: Payer: Self-pay | Admitting: Cardiovascular Disease

## 2016-03-14 NOTE — Telephone Encounter (Signed)
Follow Up:  Pt says she can not take Losartan,it makes her so sick and real dizzy.

## 2016-03-14 NOTE — Telephone Encounter (Signed)
Pt states she has not taken losartan since Friday, pt states about 30-45 minutes after she takes it she is nauseated and dizzy. Pt did not check her BP the few days she took losartan and she has not checked her BP since she has been off losartan.   Pt advised I will forward to Dr Acie Fredrickson for review.

## 2016-03-14 NOTE — Telephone Encounter (Signed)
She is intolerant to medications.  Have her stop the Losartan and see how she does.

## 2016-03-14 NOTE — Telephone Encounter (Signed)
Pt advised, verbalized understanding. Pt states she feels fine off losartan. Pt states she will try to check her BP regularly.

## 2017-02-17 ENCOUNTER — Other Ambulatory Visit (HOSPITAL_COMMUNITY): Payer: Self-pay | Admitting: Adult Health Nurse Practitioner

## 2017-02-17 DIAGNOSIS — Z780 Asymptomatic menopausal state: Secondary | ICD-10-CM

## 2017-02-20 ENCOUNTER — Inpatient Hospital Stay (HOSPITAL_COMMUNITY)
Admission: RE | Admit: 2017-02-20 | Discharge: 2017-02-20 | Disposition: A | Discharge status: Home / Self Care | Payer: Self-pay | Source: Ambulatory Visit | Attending: Adult Health Nurse Practitioner | Admitting: Adult Health Nurse Practitioner

## 2017-02-23 ENCOUNTER — Encounter (HOSPITAL_COMMUNITY): Payer: Self-pay | Admitting: Emergency Medicine

## 2017-02-23 ENCOUNTER — Emergency Department (HOSPITAL_COMMUNITY): Admission: EM | Admit: 2017-02-23 | Discharge: 2017-02-23 | Payer: Medicare HMO

## 2017-02-23 ENCOUNTER — Emergency Department (HOSPITAL_COMMUNITY)
Admission: EM | Admit: 2017-02-23 | Discharge: 2017-02-23 | Disposition: A | Discharge status: Home / Self Care | Payer: Medicare HMO | Attending: Emergency Medicine | Admitting: Emergency Medicine

## 2017-02-23 ENCOUNTER — Other Ambulatory Visit (HOSPITAL_COMMUNITY): Payer: Self-pay | Admitting: Adult Health Nurse Practitioner

## 2017-02-23 ENCOUNTER — Ambulatory Visit (HOSPITAL_COMMUNITY)
Admission: RE | Admit: 2017-02-23 | Discharge: 2017-02-23 | Disposition: A | Discharge status: Home / Self Care | Payer: Medicare HMO | Source: Ambulatory Visit | Attending: Adult Health Nurse Practitioner | Admitting: Adult Health Nurse Practitioner

## 2017-02-23 DIAGNOSIS — F17210 Nicotine dependence, cigarettes, uncomplicated: Secondary | ICD-10-CM | POA: Insufficient documentation

## 2017-02-23 DIAGNOSIS — R52 Pain, unspecified: Secondary | ICD-10-CM

## 2017-02-23 DIAGNOSIS — F17200 Nicotine dependence, unspecified, uncomplicated: Secondary | ICD-10-CM | POA: Insufficient documentation

## 2017-02-23 DIAGNOSIS — Z8673 Personal history of transient ischemic attack (TIA), and cerebral infarction without residual deficits: Secondary | ICD-10-CM | POA: Insufficient documentation

## 2017-02-23 DIAGNOSIS — I10 Essential (primary) hypertension: Secondary | ICD-10-CM | POA: Diagnosis not present

## 2017-02-23 DIAGNOSIS — Z780 Asymptomatic menopausal state: Secondary | ICD-10-CM

## 2017-02-23 DIAGNOSIS — M810 Age-related osteoporosis without current pathological fracture: Secondary | ICD-10-CM | POA: Diagnosis not present

## 2017-02-23 DIAGNOSIS — B029 Zoster without complications: Secondary | ICD-10-CM | POA: Diagnosis not present

## 2017-02-23 DIAGNOSIS — E039 Hypothyroidism, unspecified: Secondary | ICD-10-CM | POA: Diagnosis not present

## 2017-02-23 DIAGNOSIS — Z79899 Other long term (current) drug therapy: Secondary | ICD-10-CM | POA: Diagnosis not present

## 2017-02-23 DIAGNOSIS — Z7901 Long term (current) use of anticoagulants: Secondary | ICD-10-CM | POA: Diagnosis not present

## 2017-02-23 DIAGNOSIS — I252 Old myocardial infarction: Secondary | ICD-10-CM | POA: Diagnosis not present

## 2017-02-23 DIAGNOSIS — R21 Rash and other nonspecific skin eruption: Secondary | ICD-10-CM | POA: Diagnosis not present

## 2017-02-23 DIAGNOSIS — Z951 Presence of aortocoronary bypass graft: Secondary | ICD-10-CM | POA: Diagnosis not present

## 2017-02-23 DIAGNOSIS — M25511 Pain in right shoulder: Secondary | ICD-10-CM | POA: Insufficient documentation

## 2017-02-23 MED ORDER — HYDROCODONE-ACETAMINOPHEN 5-325 MG PO TABS
1.0000 | ORAL_TABLET | Freq: Once | ORAL | Status: AC
  Administered 2017-02-23: 1 via ORAL
  Filled 2017-02-23: qty 1

## 2017-02-23 MED ORDER — ACYCLOVIR 800 MG PO TABS: 800.0000 mg | ORAL_TABLET | Freq: Every day | ORAL | 0 refills | Status: DC

## 2017-02-23 MED ORDER — HYDROCODONE-ACETAMINOPHEN 5-325 MG PO TABS: 1.0000 | ORAL_TABLET | ORAL | 0 refills | Status: DC | PRN

## 2017-02-23 NOTE — ED Triage Notes (Signed)
Patient states she was getting up wood and when she picked it up she felt pain to her right 4th finger over a week ago. States she then started having white blisters on her right hand and has been having pain "shooting up into my right shoulder."

## 2017-02-23 NOTE — ED Provider Notes (Signed)
As of painful blisters ulnar aspect of right hand onset 8 days ago.  Treated with prednisone, without relief.  Pain radiates to the ulnar aspect of forearm.  On exam alert nontoxic no other associated symptoms.  Patient has tender painful blisters on ulnar aspect of right hand and on fifth finger consistent with herpes zoster.  Good capillary refill.   Orlie Dakin, MD 02/23/17 [phone]

## 2017-02-23 NOTE — ED Provider Notes (Signed)
[facility] EMERGENCY DEPARTMENT Provider Note   CSN: [id number] Arrival date & time: 02/23/17  1045     History   Chief Complaint Chief Complaint  Patient presents with  . Hand Pain    HPI [name] is a 72 y.o. female with a history as outlined below presenting with a one-week history of right shoulder pain with radiation into her forearm and hand.  She describes working in the yard picking up wood sticks for her fire 1 week ago, denies any contact with poison ivy or other such vine but that evening developed pain in her right shoulder and within 24 hours had burning pain and blisters along her right fourth and fifth fingers and hand.  She denies fevers or chills, there has been no drainage from any of the blister sites.  She has burning pain into her forearm but without blistering or rash at this site.  She has had no treatment prior to arrival for this complaint.  She is concerned about possible exposure versus insect bite as the source although denies witnessing such an injury.  HPI  Past Medical History:  Diagnosis Date  . Anxiety   . Carotid artery occlusion 05/08/2007  . 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 Hepatitis   . Hyperlipidemia   . Myocardial infarction ([place])   . Rheumatic fever   . Stroke ([place])   . Thyroid disease    Hypothyroidism    Patient Active Problem List   Diagnosis Date Noted  . Essential hypertension 03/09/2016  . PAF (paroxysmal atrial fibrillation) ([place]) 03/09/2016  . IBS (irritable bowel syndrome) 09/17/2013  . Diarrhea 04/24/2013  . Aftercare following surgery of the circulatory system, [place] 01/30/2013  . Peripheral vascular disease, unspecified ([place]) 01/30/2013  . Pain in limb 01/02/2012  . Occlusion and stenosis of carotid artery without mention of cerebral infarction 06/27/2011    Past Surgical History:  Procedure Laterality Date  . CAROTID ENDARTERECTOMY  11/25/1999   S/P Left  . CAROTID  ENDARTERECTOMY  03/24/2004   Left CCA stent- Dr. Amedeo Plenty  . CAROTID STENT  2006  . COLONOSCOPY N/A 05/08/2013   Procedure: COLONOSCOPY;  Surgeon: Rogene Houston, MD;  Location: [facility] ENDO SUITE;  Service: Endoscopy;  Laterality: N/A;  230-moved to [place] notified pt  . PR VEIN BYPASS GRAFT,AORTO-FEM-POP  2001    OB History    No data available       Home Medications    Prior to Admission medications   Medication Sig Start Date End Date Taking? Authorizing Provider  acyclovir (ZOVIRAX) 800 MG tablet Take 1 tablet (800 mg total) by mouth 5 (five) times daily. 02/23/17   Evalee Jefferson, PA-C  apixaban (ELIQUIS) 5 MG TABS tablet Take 1 tablet (5 mg total) by mouth 2 (two) times daily. 01/28/16   Nahser, Wonda Cheng, MD  budesonide-formoterol (SYMBICORT) 80-4.5 MCG/ACT inhaler Inhale 1 puff into the lungs daily.     [provider]  carvedilol (COREG) 6.25 MG tablet Take 1 tablet (6.25 mg total) by mouth 2 (two) times daily. 02/04/16   Nahser, Wonda Cheng, MD  cyanocobalamin (,VITAMIN B-12,) 1000 MCG/ML injection Inject 1,000 mcg into the muscle every 30 (thirty) days.    [provider]  dicyclomine (BENTYL) 20 MG tablet Take 20 mg by mouth 2 (two) times daily.    [provider]  HYDROcodone-acetaminophen (NORCO/VICODIN) 5-325 MG tablet Take 1 tablet by mouth every 4 (four) hours as needed.  02/23/17   Deveron Shamoon, Almyra Free, PA-C  hyoscyamine (LEVSIN SL) 0.125 MG SL tablet Place 1 tablet (0.125 mg total) under the tongue every 4 (four) hours as needed. 11/18/14   Setzer, Rona Ravens, NP  levothyroxine (LEVOXYL) 125 MCG tablet Take 125 mcg by mouth daily before breakfast.    [provider]  LORazepam (ATIVAN) 0.5 MG tablet Take 1.5 mg by mouth at bedtime.     [provider]  losartan (COZAAR) 50 MG tablet Take 1 tablet (50 mg total) by mouth daily. 03/08/16 06/06/16  Nahser, Wonda Cheng, MD  rosuvastatin (CRESTOR) 20 MG tablet Take 20 mg by mouth daily.    [provider]     Family History Family History  Problem Relation Age of Onset  . Cancer Sister   . Hypertension Father   . Heart attack Mother   . Hyperlipidemia Mother   . Kidney disease Brother   . Cancer Brother   . Cancer Brother        Lung   . Colon cancer Neg Hx     Social History Social History   Tobacco Use  . Smoking status: Current Every Day Smoker    Packs/day: 0.25    Years: 40.00    Pack years: 10.00    Types: Cigarettes  . Smokeless tobacco: Never Used  . Tobacco comment: 1 pack every 3 days x 75yrs  Substance Use Topics  . Alcohol use: No  . Drug use: No     Allergies   Penicillins; Morphine and related; and Losartan   Review of Systems Review of Systems  Constitutional: Negative for fever.  Musculoskeletal: Positive for arthralgias. Negative for joint swelling and myalgias.  Skin: Positive for rash.  Neurological: Negative for weakness and numbness.     Physical Exam Updated Vital Signs BP (!) 207/74 (BP Location: Left Arm)   Pulse 65   Temp 97.9 F (36.6 C) (Oral)   Resp 16   Ht 4\' 11"  (1.499 m)   Wt 53.5 kg (118 lb)   SpO2 98%   BMI 23.83 kg/m   Physical Exam  Constitutional: She appears well-developed and well-nourished. No distress.  HENT:  Head: Normocephalic.  Neck: Neck supple.  Cardiovascular: Normal rate.  Pulmonary/Chest: Effort normal. She has no wheezes.  Musculoskeletal: Normal range of motion. She exhibits tenderness.  Patient with right shoulder and forearm pain worsened with light touch, there is no redness, edema, visible deformity or rash.    Skin: Rash noted. Rash is vesicular.  Vesicular dry rash with an intact blisters right volar ring and fifth fingers and distal volar hand.  There is a few scattered blisters also on her dorsal ring finger.     ED Treatments / Results  Labs (all labs ordered are listed, but only abnormal results are displayed) Labs Reviewed - No data to display  EKG  EKG Interpretation None        Radiology   Procedures Procedures (including critical care time)  Medications Ordered in ED Medications  HYDROcodone-acetaminophen (NORCO/VICODIN) 5-325 MG per tablet 1 tablet (1 tablet Oral Given 02/23/17 1220)     Initial Impression / Assessment and Plan / ED Course  I have reviewed the triage vital signs and the nursing notes.  Pertinent labs & imaging results that were available during my care of the patient were reviewed by me and considered in my medical decision making (see chart for details).     Patient was also seen by Dr. Cathleen Fears during this  visit who agrees with diagnosis of herpes zoster.  Patient was placed on acyclovir and Vicodin, cautioned regarding sedation with this medication.  Advised to follow-up with her primary doctor in 1 week for recheck of this condition,  PRN follow-up anticipated here.  Discussed caution regarding infectious nature of this condition.  Pt with increased bp at recheck, pt very anxious and ready to leave.  Denies cp, sob, headache, vision changes.  Advised recheck bp within 1 week.  Final Clinical Impressions(s) / ED Diagnoses   Final diagnoses:  Herpes zoster without complication  Essential hypertension    ED Discharge Orders        Ordered    acyclovir (ZOVIRAX) 800 MG tablet  5 times daily     02/23/17 1312    HYDROcodone-acetaminophen (NORCO/VICODIN) 5-325 MG tablet  Every 4 hours PRN     02/23/17 1312       Evalee Jefferson, PA-C 02/23/17 [place], MD 02/23/17 1517

## 2017-02-23 NOTE — Discharge Instructions (Signed)
Take the entire course of the acyclovir.  Do not drive within 4 hours of taking hydrocodone as this medication will make you drowsy.  She is caution around others as discussed as this condition is contagious to anyone who is immunocompromised or has never had chickenpox.  Your blood pressure is elevated today, please have this rechecked within the week and make sure you are taking your home blood pressure medications.

## 2017-05-08 ENCOUNTER — Ambulatory Visit (INDEPENDENT_AMBULATORY_CARE_PROVIDER_SITE_OTHER): Payer: Self-pay | Admitting: Internal Medicine

## 2017-05-15 ENCOUNTER — Ambulatory Visit (INDEPENDENT_AMBULATORY_CARE_PROVIDER_SITE_OTHER): Payer: Self-pay | Admitting: Internal Medicine

## 2017-05-15 ENCOUNTER — Encounter (INDEPENDENT_AMBULATORY_CARE_PROVIDER_SITE_OTHER): Payer: Self-pay | Admitting: Internal Medicine

## 2017-06-30 ENCOUNTER — Telehealth: Payer: Self-pay | Admitting: Cardiovascular Disease

## 2017-06-30 NOTE — Telephone Encounter (Signed)
   Primary Cardiologist:Philip Nahser, MD  Chart reviewed as part of pre-operative protocol coverage. Because of [name]'s past medical history and time since last visit, he/she will require a follow-up visit in order to better assess preoperative cardiovascular risk. Last OV 02/2016, over 1 year ago.  Pre-op covering staff: - Please schedule appointment and call patient to inform them. - Please contact requesting surgeon's office via preferred method (i.e, phone, fax) to inform them of need for appointment prior to surgery.  [name], PA-C  06/30/2017, 4:29 PM

## 2017-06-30 NOTE — Telephone Encounter (Signed)
Pt scheduled to see Dr. Acie Fredrickson on 5/22 for cardiac clearance

## 2017-06-30 NOTE — Telephone Encounter (Signed)
New Message     [facility] Pre-operative Risk Assessment    Request for surgical clearance:  1. What type of surgery is being performed? bunion outpatient surgery at hospital  2. When is this surgery scheduled? tbd maybe late June or july  3. What type of clearance is required (medical clearance vs. Pharmacy clearance to hold med vs. Both)? both  4. Are there any medications that need to be held prior to surgery and how long? Eliquis  5. Practice name and name of physician performing surgery? Outpatient at hospital/ Dr. Steffanie Rainwater  6. What is your office phone number [phone]   7.   What is your office fax number [phone]  8.   Anesthesia type (None, local, MAC, general) ? Iv sedation   Nicholes Stairs 06/30/2017, 10:54 AM  _________________________________________________________________   (provider comments below)

## 2017-07-12 ENCOUNTER — Ambulatory Visit: Payer: Self-pay | Admitting: Cardiovascular Disease

## 2017-07-18 ENCOUNTER — Encounter (INDEPENDENT_AMBULATORY_CARE_PROVIDER_SITE_OTHER): Payer: Self-pay

## 2017-07-18 ENCOUNTER — Ambulatory Visit: Payer: Medicare HMO | Admitting: Cardiovascular Disease

## 2017-07-18 ENCOUNTER — Encounter: Payer: Self-pay | Admitting: Cardiovascular Disease

## 2017-07-18 VITALS — BP 128/64 | HR 58 | Ht 59.0 in | Wt 118.0 lb

## 2017-07-18 DIAGNOSIS — I480 Paroxysmal atrial fibrillation: Secondary | ICD-10-CM

## 2017-07-18 NOTE — Progress Notes (Signed)
Cardiology Office Note   Date:  07/18/2017   ID:  [name], [name] 28-Jun-1945, MRN [id number]  PCP:  Dione Housekeeper, MD  Cardiologist:   Mertie Moores, MD   Chief Complaint  Patient presents with  . Atrial Fibrillation   1. Palpitations 2. PVD - s/p Left carotid endarectomy   3. Hypothyroidism 4. Hyperlipidemia 5. Rheumatic fever  6. Paroxysmal atrial fib   Oct. 18, 2017  [name] is a 72 y.o. female who presents for evaluation of heart palpitations  .She has a history of  ?  coronary artery disease. She has seen someone  in the practice many years ago but does not recall what was. (She thinks that she has had coronary artery bypass grafting but she does not have any evidence of surgery in the form of the scar and her chest x-ray does not show a sternotomy). She also has a history of significant peripheral vascular disease. She continues to smoke.   Was started on bystolic years ago This caused some palpitation  Was seen in the ER with HTN HR was 62 in the ER,   BP was 177/64.  Dr. Octaviano Batty decreased the Bystolic to 5 mg  She stopped taking It completely. Since that time she's had lots of tremulousness. She also has noted that her heart rate is much faster and she's having worse palpitations.  She still has occasional episodes of heart racing, disorientation,  Past several months her vital signs have gradually normalized and she's not having all the tremulousness and hard heart pounding.  He denies any angina. She does have some shortness of breath with exertion.  Her daughters accompanied her to the office visit today. The patient does not drive. She insists that she is very busy but her daughters, that she really does not go anywhere.  Jan. 16, 2018:  [name] was found to have PAF with her event monitor.She was started on Eliquis 5 mg twice a day. She is also now on carvedilol for rate control.   The Bystolic caused diarrhea.   The   May 28 , 2019: [name] is  seen for follow-up of her paroxysmal atrial fibrillation. She needs to have some foot surgery.    She needs to hold Eliquis for 3 days prior to surgery  Breathing has been good.  Still smoking - does not want to stop      Past Medical History:  Diagnosis Date  . Anxiety   . Carotid artery occlusion 05/08/2007  . 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 Hepatitis   . Hyperlipidemia   . Myocardial infarction ([place])   . Rheumatic fever   . Stroke ([place])   . Thyroid disease    Hypothyroidism    Past Surgical History:  Procedure Laterality Date  . CAROTID ENDARTERECTOMY  11/25/1999   S/P Left  . CAROTID ENDARTERECTOMY  03/24/2004   Left CCA stent- Dr. Amedeo Plenty  . CAROTID STENT  2006  . COLONOSCOPY N/A 05/08/2013   Procedure: COLONOSCOPY;  Surgeon: Rogene Houston, MD;  Location: [facility]O [facility];  Service: Endoscopy;  Laterality: N/A;  230-moved to [place] notified pt  . PR VEIN BYPASS GRAFT,AORTO-FEM-POP  2001     Current Outpatient Medications  Medication Sig Dispense Refill  . acyclovir (ZOVIRAX) 800 MG tablet Take 1 tablet (800 mg total) by mouth 5 (five) times daily. 35 tablet 0  . apixaban (ELIQUIS) 5 MG TABS tablet Take 1 tablet (5  mg total) by mouth 2 (two) times daily. 60 tablet 11  . budesonide-formoterol (SYMBICORT) 80-4.5 MCG/ACT inhaler Inhale 1 puff into the lungs daily.     . carvedilol (COREG) 6.25 MG tablet Take 1 tablet (6.25 mg total) by mouth 2 (two) times daily. 60 tablet 11  . cyanocobalamin (,VITAMIN B-12,) 1000 MCG/ML injection Inject 1,000 mcg into the muscle every 30 (thirty) days.    Marland Kitchen dicyclomine (BENTYL) 20 MG tablet Take 20 mg by mouth 2 (two) times daily.    Marland Kitchen HYDROcodone-acetaminophen (NORCO/VICODIN) 5-325 MG tablet Take 1 tablet by mouth every 4 (four) hours as needed. 20 tablet 0  . hyoscyamine (LEVSIN SL) 0.125 MG SL tablet Place 1 tablet (0.125 mg total) under the tongue every 4 (four) hours as needed. 90 tablet 2  .  levothyroxine (LEVOXYL) 125 MCG tablet Take 125 mcg by mouth daily before breakfast.    . LORazepam (ATIVAN) 0.5 MG tablet Take 1.5 mg by mouth at bedtime.     . rosuvastatin (CRESTOR) 20 MG tablet Take 20 mg by mouth daily.    Marland Kitchen losartan (COZAAR) 50 MG tablet Take 1 tablet (50 mg total) by mouth daily. 30 tablet 11   No current facility-administered medications for this visit.     Allergies:   Penicillins; Morphine and related; and Losartan    Social History:  The patient  reports that she has been smoking cigarettes.  She has a 10.00 pack-year smoking history. She has never used smokeless tobacco. She reports that she does not drink alcohol or use drugs.   Family History:  The patient's family history includes Cancer in her brother, brother, and sister; Heart attack in her mother; Hyperlipidemia in her mother; Hypertension in her father; Kidney disease in her brother.    ROS: Noted in current history, otherwise review of systems is negative.  Physical Exam: Blood pressure 128/64, pulse (!) 58, height 4\' 11"  (1.499 m), weight 118 lb (53.5 kg), SpO2 96 %.  GEN:  Well nourished, well developed in no acute distress HEENT: Normal NECK: No JVD; No carotid bruits LYMPHATICS: No lymphadenopathy CARDIAC: RRR , no murmurs, rubs, gallops RESPIRATORY:  Clear to auscultation without rales, wheezing or rhonchi  ABDOMEN: Soft, non-tender, non-distended MUSCULOSKELETAL:  No edema; No deformity  SKIN: Warm and dry NEUROLOGIC:  Alert and oriented x 3   EKG: Jul 18, 2017: Sinus bradycardia with first-degree AV block.  Otherwise normal EKG.  Recent Labs: No results found for requested labs within last 8760 hours.    Lipid Panel No results found for: CHOL, TRIG, HDL, CHOLHDL, VLDL, LDLCALC, LDLDIRECT    Wt Readings from Last 3 Encounters:  07/18/17 118 lb (53.5 kg)  02/23/17 118 lb (53.5 kg)  03/08/16 120 lb 12.8 oz (54.8 kg)      Other studies Reviewed: Additional studies/ records  that were reviewed today include: . Review of the above records demonstrates:    ASSESSMENT AND PLAN:  1.  Paroxysmal atrial fibrillation:  She has a history of paroxysmal atrial fibrillation.  She remains in normal sinus rhythm.  She is on Eliquis.  She needs to have foot surgery.  She will be at low risk for cardiovascular complications with this foot surgery.  It is okay for her to hold Eliquis for 3 days prior to the surgery.  She should restart the Eliquis as soon as possible after the surgery - would defer to the surgeon to make that recommendation.   2. Hypertension:   Pressure is  well controlled.  3. Peripheral vascular disease: History of peripheral vascular disease.  Stable.   Current medicines are reviewed at length with the patient today.  The patient does not have concerns regarding medicines.  Labs/ tests ordered today include:  No orders of the defined types were placed in this encounter.    Disposition:   FU with me in 1 year     Mertie Moores, MD  07/18/2017 4:28 PM    [address], [place], [place]  [address] Phone: [phone]; Fax: [phone]

## 2017-07-18 NOTE — Telephone Encounter (Signed)
Patient has a history of paroxysmal atrial for ablation.  She remains in normal sinus rhythm today.  Unfortunately, she continues to smoke.  She needs to have some toe surgery. She should be at low risk from a cardiovascular standpoint to have the surgery.  He will be okay for her to hold her Eliquis for 3 days prior to surgery.  She should restart the Eliquis as soon as possible after the surgery.  We will leave the timing of that up to the surgeon    Mertie Moores, MD  07/18/2017 4:57 PM    [address],  [place] [place], [place]  [address] Pager [phone] Phone: [phone]; Fax: [phone]

## 2017-07-18 NOTE — Patient Instructions (Signed)
Medication Instructions:  Your physician recommends that you continue on your current medications as directed. Please refer to the Current Medication list given to you today.   Labwork: None Ordered   Testing/Procedures: None Ordered   Follow-Up: Your physician wants you to follow-up in: 1 year with Dr. Nahser.  You will receive a reminder letter in the mail two months in advance. If you don't receive a letter, please call our office to schedule the follow-up appointment.   If you need a refill on your cardiac medications before your next appointment, please call your pharmacy.   Thank you for choosing [facility]! Emanuelle Hammerstrom, RN [phone]    

## 2019-03-26 ENCOUNTER — Ambulatory Visit: Payer: Medicare HMO | Attending: Family Medicine | Admitting: Physical Therapy

## 2019-03-26 ENCOUNTER — Encounter: Payer: Self-pay | Admitting: Physical Therapy

## 2019-03-26 ENCOUNTER — Other Ambulatory Visit: Payer: Self-pay

## 2019-03-26 DIAGNOSIS — R293 Abnormal posture: Secondary | ICD-10-CM | POA: Diagnosis present

## 2019-03-26 DIAGNOSIS — M546 Pain in thoracic spine: Secondary | ICD-10-CM | POA: Diagnosis not present

## 2019-03-26 NOTE — Therapy (Signed)
[facility] [place], [place], [address] Phone: [phone]   Fax:  [phone]  Physical Therapy Evaluation  Patient Details  Name: [name] MRN: [id number] Date of Birth: 1945-03-05 Referring Provider (PT): Melissa Montane   Encounter Date: 03/26/2019  PT End of Session - 03/26/19 1308    Visit Number  1    Number of Visits  12    Date for PT Re-Evaluation  05/07/19    PT Start Time  0945    PT Stop Time  1028    PT Time Calculation (min)  43 min    Activity Tolerance  Patient tolerated treatment well    Behavior During Therapy  [facility] for tasks assessed/performed       Past Medical History:  Diagnosis Date  . Anxiety   . Carotid artery occlusion 05/08/2007  . 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 Hepatitis   . Hyperlipidemia   . Myocardial infarction ([place])   . Rheumatic fever   . Stroke ([place])   . Thyroid disease    Hypothyroidism    Past Surgical History:  Procedure Laterality Date  . CAROTID ENDARTERECTOMY  11/25/1999   S/P Left  . CAROTID ENDARTERECTOMY  03/24/2004   Left CCA stent- Dr. Amedeo Plenty  . CAROTID STENT  2006  . COLONOSCOPY N/A 05/08/2013   Procedure: COLONOSCOPY;  Surgeon: Rogene Houston, MD;  [facility];  Service: Endoscopy;  Laterality: N/A;  230-moved to [place] notified pt  . PR VEIN BYPASS GRAFT,AORTO-FEM-POP  2001    There were no vitals filed for this visit.   Subjective Assessment - 03/26/19 1305    Subjective  COVID-19 screen performed prior to patient entering clinic.  The patient presents to the clinic today with c/o ongoing and increasing mid-back pain.  Today, at rest her pain is a 4/10 but her pain can easily rise to a 7-8+/10 after standing mor than 5 minutes and performing ADL's.    Pertinent History  MI, CVA, Neck surgery, carotid stent, OP.    Limitations  Standing    How long can you stand comfortably?  <5 minutes.    Patient  Stated Goals  Get rid of mid-back pain and do more with less pain.    Currently in Pain?  Yes    Pain Score  4     Pain Location  Back    Pain Orientation  Right    Pain Descriptors / Indicators  Aching;Sharp    Pain Type  Chronic pain    Pain Onset  More than a month ago    Pain Frequency  Constant    Aggravating Factors   See above.    Pain Relieving Factors  See above.         [facility] PT Assessment - 03/26/19 0001      Assessment   Medical Diagnosis  Upper back pain.    Referring Provider (PT)  Melissa Montane    Onset Date/Surgical Date  --   Ongoing.     Precautions   Precautions  --   OP.     Restrictions   Weight Bearing Restrictions  No      Balance Screen   Has the patient fallen in the past 6 months  No    Has the patient had a decrease in activity level because of a fear of falling?   No    Is the patient reluctant to  leave their home because of a fear of falling?   No      Home Environment   Living Environment  Private residence      Prior Function   Level of Independence  Independent      Posture/Postural Control   Posture/Postural Control  Postural limitations    Postural Limitations  Rounded Shoulders;Forward head      Deep Tendon Reflexes   DTR Assessment Site  Biceps;Brachioradialis;Triceps    Biceps DTR  1+    Brachioradialis DTR  1+    Triceps DTR  1+      ROM / Strength   AROM / PROM / Strength  AROM;Strength      AROM   Overall AROM Comments  Bilateral cervical rotation= 45 degrees.  Full bilateral shoulder elevation.      Strength   Overall Strength Comments  Rhomboid strength graded at 4/5.      Palpation   Palpation comment  Very tender to palpation just right of T4-5.                Objective measurements completed on examination: See above findings.      [facility] Adult PT Treatment/Exercise - 03/26/19 0001      Modalities   Modalities  Electrical Stimulation;Moist Heat      Moist Heat Therapy   Number Minutes  Moist Heat  15 Minutes    Moist Heat Location  --   Thoracic.     Acupuncturist Location  Right T4-5    Electrical Stimulation Action  Pre-mod.    Electrical Stimulation Parameters  80-150 Hz x 15 minutes.    Electrical Stimulation Goals  Pain                  PT Long Term Goals - 03/26/19 1406      PT LONG TERM GOAL #1   Title  Independent with a HEP.    Time  6    Period  Weeks    Status  New      PT LONG TERM GOAL #2   Title  Stand 20 minutes with pain not > 3/10.    Time  6    Period  Weeks    Status  New      PT LONG TERM GOAL #3   Title  Perform ADL's with pain not > 3-4/10.    Time  6    Period  Weeks    Status  New             Plan - 03/26/19 1402    Clinical Impression Statement  The patient presents to OPPT with ongoing and increasing mid-back pain that rises to severe levels with standing and performing ADL's.  She is very tender to palpation right of T4-5.  She has weak Rhomboids.  She demonstrates a forward head and rounded shoulder posture.Patient will benefit from skilled physical therapy intervention to address deficits and pain.    Personal Factors and Comorbidities  Comorbidity 1;Comorbidity 2    Comorbidities  MI, CVA, Neck surgery, carotid stent, OP.    Examination-Activity Limitations  Stand    Examination-Participation Restrictions  Cleaning;Other    Stability/Clinical Decision Making  Evolving/Moderate complexity    Clinical Decision Making  Low    Rehab Potential  Good    PT Frequency  2x / week    PT Duration  6 weeks    PT Treatment/Interventions  ADLs/Self Care Home  Management;Cryotherapy;Electrical Stimulation;Ultrasound;Moist Heat;Therapeutic activities;Therapeutic exercise;Manual techniques;Patient/family education    PT Next Visit Plan  Scapular strengthening.  Combo e'stim/U/S, STW/M, HMP and electrical stimulation.    Consulted and Agree with Plan of Care  Patient       Patient will  benefit from skilled therapeutic intervention in order to improve the following deficits and impairments:  Pain, Decreased activity tolerance, Decreased strength, Postural dysfunction  Visit Diagnosis: Pain in thoracic spine - Plan: PT plan of care cert/re-cert  Abnormal posture - Plan: PT plan of care cert/re-cert     Problem List Patient Active Problem List   Diagnosis Date Noted  . Essential hypertension 03/09/2016  . PAF (paroxysmal atrial fibrillation) ([place]) 03/09/2016  . IBS (irritable bowel syndrome) 09/17/2013  . Diarrhea 04/24/2013  . Aftercare following surgery of the circulatory system, [place] 01/30/2013  . Peripheral vascular disease, unspecified ([place]) 01/30/2013  . Pain in limb 01/02/2012  . Occlusion and stenosis of carotid artery without mention of cerebral infarction 06/27/2011    [name], [place] MPT 03/26/2019, 2:10 PM  [facility] [address] [place], [place], [address] Phone: [phone]   Fax:  [phone]  Name: [name] MRN: [id number] Date of Birth: [birth year]

## 2019-04-09 ENCOUNTER — Ambulatory Visit: Payer: Medicare HMO | Admitting: Physical Therapy

## 2019-04-16 ENCOUNTER — Other Ambulatory Visit: Payer: Self-pay

## 2019-04-16 ENCOUNTER — Ambulatory Visit: Payer: Medicare HMO | Admitting: *Deleted

## 2019-04-16 DIAGNOSIS — R293 Abnormal posture: Secondary | ICD-10-CM

## 2019-04-16 DIAGNOSIS — M546 Pain in thoracic spine: Secondary | ICD-10-CM

## 2019-04-16 NOTE — Therapy (Signed)
[facility] [place], [place], [address] Phone: [phone]   Fax:  [phone]  Physical Therapy Treatment  Patient Details  Name: [name] MRN: [id number] Date of Birth: [birth year] Referring Provider (PT): Melissa Montane   Encounter Date: 04/16/2019  PT End of Session - 04/16/19 1044    Visit Number  2    Number of Visits  12    Date for PT Re-Evaluation  05/07/19    PT Start Time  0946    PT Stop Time  [id number]    PT Time Calculation (min)  49 min       Past Medical History:  Diagnosis Date  . Anxiety   . Carotid artery occlusion 05/08/2007  . 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 Hepatitis   . Hyperlipidemia   . Myocardial infarction ([place])   . Rheumatic fever   . Stroke ([place])   . Thyroid disease    Hypothyroidism    Past Surgical History:  Procedure Laterality Date  . CAROTID ENDARTERECTOMY  11/25/1999   S/P Left  . CAROTID ENDARTERECTOMY  03/24/2004   Left CCA stent- Dr. Amedeo Plenty  . CAROTID STENT  2006  . COLONOSCOPY N/A 05/08/2013   Procedure: COLONOSCOPY;  Surgeon: Rogene Houston, MD;  [facility];  Service: Endoscopy;  Laterality: N/A;  230-moved to [place] notified pt  . PR VEIN BYPASS GRAFT,AORTO-FEM-POP  2001    There were no vitals filed for this visit.  Subjective Assessment - 04/16/19 0905    Subjective  COVID-19 screen performed prior to patient entering clinic.    Today, at rest her pain is a 2/10    Pertinent History  MI, CVA, Neck surgery, carotid stent, OP.    Limitations  Standing    How long can you stand comfortably?  <5 minutes.    Patient Stated Goals  Get rid of mid-back pain and do more with less pain.    Currently in Pain?  Yes    Pain Score  2     Pain Orientation  Right    Pain Type  Chronic pain    Pain Onset  More than a month ago                       [facility] Adult PT Treatment/Exercise - 04/16/19 0001      Exercises   Exercises  Shoulder      Shoulder Exercises: Seated   Row  Strengthening;Both;10 reps   Pt instructed in seated and standing scapulae retractions     Modalities   Modalities  Electrical Stimulation;Moist Heat;Ultrasound      Moist Heat Therapy   Number Minutes Moist Heat  15 Minutes    Moist Heat Location  --   Thoracic.     Financial trader Parameters  80-150hz  x 15 min    Electrical Stimulation Goals  Pain      Ultrasound   Ultrasound Location  T4-5 Bil    Ultrasound Parameters  1.5 w/cm2 x 12 mins    Ultrasound Goals  Pain      Manual Therapy   Manual Therapy  Soft tissue mobilization    Soft tissue mobilization  STW to bil. T4-6 in sitting  PT Education - 04/16/19 1100    Education Details  posture scapulae retractions    Person(s) Educated  Patient    Methods  Explanation;Demonstration;Tactile cues;Verbal cues;Handout    Comprehension  Verbalized understanding;Returned demonstration;Verbal cues required;Tactile cues required          PT Long Term Goals - 03/26/19 1406      PT LONG TERM GOAL #1   Title  Independent with a HEP.    Time  6    Period  Weeks    Status  New      PT LONG TERM GOAL #2   Title  Stand 20 minutes with pain not > 3/10.    Time  6    Period  Weeks    Status  New      PT LONG TERM GOAL #3   Title  Perform ADL's with pain not > 3-4/10.    Time  6    Period  Weeks    Status  New            Plan - 04/16/19 1046    Clinical Impression Statement  Pt arrived today doing fairly well with mainly soreness . Pt was instructed in scapular retraction with light resistance using yellow tubing and cuing for good posture. [place] and STW were perforbed Bil. T4-6 paras in sitting with notable tightness and soreness. Normal modality response today    Personal Factors and Comorbidities   Comorbidity 1;Comorbidity 2    Comorbidities  MI, CVA, Neck surgery, carotid stent, OP.    Examination-Participation Restrictions  Cleaning;Other    Stability/Clinical Decision Making  Evolving/Moderate complexity    Rehab Potential  Good    PT Frequency  2x / week    PT Duration  6 weeks    PT Treatment/Interventions  ADLs/Self Care Home Management;Cryotherapy;Electrical Stimulation;Ultrasound;Moist Heat;Therapeutic activities;Therapeutic exercise;Manual techniques;Patient/family education    PT Next Visit Plan  Scapular strengthening.  Combo e'stim/U/S, STW/M, HMP and electrical stimulation.       Patient will benefit from skilled therapeutic intervention in order to improve the following deficits and impairments:  Pain, Decreased activity tolerance, Decreased strength, Postural dysfunction  Visit Diagnosis: Pain in thoracic spine  Abnormal posture     Problem List Patient Active Problem List   Diagnosis Date Noted  . Essential hypertension 03/09/2016  . PAF (paroxysmal atrial fibrillation) ([place]) 03/09/2016  . IBS (irritable bowel syndrome) 09/17/2013  . Diarrhea 04/24/2013  . Aftercare following surgery of the circulatory system, [place] 01/30/2013  . Peripheral vascular disease, unspecified ([place]) 01/30/2013  . Pain in limb 01/02/2012  . Occlusion and stenosis of carotid artery without mention of cerebral infarction 06/27/2011    [name],CHRIS , PTA 04/16/2019, 11:05 AM  [facility] [address] [place], [place], [address] Phone: [phone]   Fax:  [phone]  Name: [name] MRN: [id number] Date of Birth: [birth year]

## 2019-04-30 ENCOUNTER — Other Ambulatory Visit: Payer: Self-pay

## 2019-04-30 ENCOUNTER — Ambulatory Visit: Payer: Medicare HMO | Attending: Family Medicine | Admitting: *Deleted

## 2019-04-30 DIAGNOSIS — R293 Abnormal posture: Secondary | ICD-10-CM

## 2019-04-30 DIAGNOSIS — M546 Pain in thoracic spine: Secondary | ICD-10-CM | POA: Diagnosis present

## 2019-04-30 NOTE — Therapy (Signed)
[facility] [place], [place], [address] Phone: [phone]   Fax:  [phone]  Physical Therapy Treatment  Patient Details  Name: [name] MRN: [id number] Date of Birth: Dec 28, 1945 Referring Provider (PT): Melissa Montane   Encounter Date: 04/30/2019  PT End of Session - 04/30/19 0949    Visit Number  3    Number of Visits  12    Date for PT Re-Evaluation  05/07/19    PT Start Time  0900    PT Stop Time  [id number]   only 2 units. Pt denied heat and e-stim.   PT Time Calculation (min)  36 min       Past Medical History:  Diagnosis Date  . Anxiety   . Carotid artery occlusion 05/08/2007  . 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 Hepatitis   . Hyperlipidemia   . Myocardial infarction ([place])   . Rheumatic fever   . Stroke ([place])   . Thyroid disease    Hypothyroidism    Past Surgical History:  Procedure Laterality Date  . CAROTID ENDARTERECTOMY  11/25/1999   S/P Left  . CAROTID ENDARTERECTOMY  03/24/2004   Left CCA stent- Dr. Amedeo Plenty  . CAROTID STENT  2006  . COLONOSCOPY N/A 05/08/2013   Procedure: COLONOSCOPY;  Surgeon: Rogene Houston, MD;  [facility];  Service: Endoscopy;  Laterality: N/A;  230-moved to [place] notified pt  . PR VEIN BYPASS GRAFT,AORTO-FEM-POP  2001    There were no vitals filed for this visit.  Subjective Assessment - 04/30/19 0904    Subjective  COVID-19 screen performed prior to patient entering clinic.    Today, at rest her pain is a 6/10. I felt nauseaus after last Rx. Not sure if it was the heat and estim. No heat and estim please    Pertinent History  MI, CVA, Neck surgery, carotid stent, OP.    Limitations  Standing    How long can you stand comfortably?  <5 minutes.    Patient Stated Goals  Get rid of mid-back pain and do more with less pain.    Currently in Pain?  Yes    Pain Score  6     Pain Location  Back    Pain Orientation  Right    Pain Descriptors / Indicators  Aching;Sharp    Pain Type  Chronic pain    Pain Onset  More than a month ago                       [facility] Adult PT Treatment/Exercise - 04/30/19 0001      Exercises   Exercises  Shoulder      Shoulder Exercises: Seated   Row  Strengthening;Both;10 reps   Pt instructed in seated and standing scapulae retractions     Modalities   Modalities  Ultrasound      Ultrasound   Ultrasound Location  RT T1-4 paras and into UT    Ultrasound Parameters  1.5 w/cm2 x 12 mins    Ultrasound Goals  Pain      Manual Therapy   Manual Therapy  Soft tissue mobilization    Soft tissue mobilization  STW to  T1-4 paras and UT in sitting                  PT Long Term Goals - 03/26/19 1406      PT LONG TERM  GOAL #1   Title  Independent with a HEP.    Time  6    Period  Weeks    Status  New      PT LONG TERM GOAL #2   Title  Stand 20 minutes with pain not > 3/10.    Time  6    Period  Weeks    Status  New      PT LONG TERM GOAL #3   Title  Perform ADL's with pain not > 3-4/10.    Time  6    Period  Weeks    Status  New            Plan - 04/30/19 [id number]    Clinical Impression Statement  Pt arrived today not doing well due to pain mainly RT sided T1-4 paras and UT. HEP was reviewed for technique and tolerance, but Pt reports having to much pain to do much ex today. [place] was performed and tolerated well, but only light to moderate STW pressure due to pain.. No heat and e-stim were performed today as per Pt.    Personal Factors and Comorbidities  Comorbidity 1;Comorbidity 2    Comorbidities  MI, CVA, Neck surgery, carotid stent, OP.    Examination-Activity Limitations  Stand    Examination-Participation Restrictions  Cleaning;Other    Stability/Clinical Decision Making  Evolving/Moderate complexity    Rehab Potential  Good    PT Frequency  2x / week    PT Duration  6 weeks    PT Treatment/Interventions  ADLs/Self Care Home  Management;Cryotherapy;Electrical Stimulation;Ultrasound;Moist Heat;Therapeutic activities;Therapeutic exercise;Manual techniques;Patient/family education    PT Next Visit Plan  Scapular strengthening. /U/S, STW/M     No heat and estim as per Pt    Consulted and Agree with Plan of Care  Patient       Patient will benefit from skilled therapeutic intervention in order to improve the following deficits and impairments:  Pain, Decreased activity tolerance, Decreased strength, Postural dysfunction  Visit Diagnosis: Pain in thoracic spine  Abnormal posture     Problem List Patient Active Problem List   Diagnosis Date Noted  . Essential hypertension 03/09/2016  . PAF (paroxysmal atrial fibrillation) ([place]) 03/09/2016  . IBS (irritable bowel syndrome) 09/17/2013  . Diarrhea 04/24/2013  . Aftercare following surgery of the circulatory system, [place] 01/30/2013  . Peripheral vascular disease, unspecified ([place]) 01/30/2013  . Pain in limb 01/02/2012  . Occlusion and stenosis of carotid artery without mention of cerebral infarction 06/27/2011    Britiny Defrain,CHRIS, PTA 04/30/2019, 12:06 PM  [facility] [address] [place], [place], [address] Phone: [phone]   Fax:  [phone]  Name: [name] MRN: [id number] Date of Birth: 1946-01-12

## 2019-05-14 ENCOUNTER — Ambulatory Visit: Payer: Medicare HMO | Admitting: *Deleted

## 2019-05-14 ENCOUNTER — Other Ambulatory Visit: Payer: Self-pay

## 2019-05-14 DIAGNOSIS — R293 Abnormal posture: Secondary | ICD-10-CM | POA: Diagnosis present

## 2019-05-14 DIAGNOSIS — M546 Pain in thoracic spine: Secondary | ICD-10-CM | POA: Diagnosis not present

## 2019-05-29 ENCOUNTER — Ambulatory Visit: Payer: Medicare HMO | Admitting: Physical Therapy

## 2019-05-30 NOTE — Therapy (Signed)
[facility] [place], [place], [address] Phone: [phone]   Fax:  [phone]  Physical Therapy Treatment  Patient Details  Name: BLAKLEIGH WAYCHOFF MRN: [id number] Date of Birth: 1945-09-14 Referring Provider (PT): Melissa Montane   Encounter Date: 05/14/2019    Past Medical History:  Diagnosis Date  . Anxiety   . Carotid artery occlusion 05/08/2007  . 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 Hepatitis   . Hyperlipidemia   . Myocardial infarction ([place])   . Rheumatic fever   . Stroke ([place])   . Thyroid disease    Hypothyroidism    Past Surgical History:  Procedure Laterality Date  . CAROTID ENDARTERECTOMY  11/25/1999   S/P Left  . CAROTID ENDARTERECTOMY  03/24/2004   Left CCA stent- Dr. Amedeo Plenty  . CAROTID STENT  2006  . COLONOSCOPY N/A 05/08/2013   Procedure: COLONOSCOPY;  Surgeon: Rogene Houston, MD;  [facility];  Service: Endoscopy;  Laterality: N/A;  230-moved to [place] notified pt  . PR VEIN BYPASS GRAFT,AORTO-FEM-POP  2001    There were no vitals filed for this visit.  4th visit  See Flow sheet for Rx                             PT Long Term Goals - 03/26/19 1406      PT LONG TERM GOAL #1   Title  Independent with a HEP.    Time  6    Period  Weeks    Status  New      PT LONG TERM GOAL #2   Title  Stand 20 minutes with pain not > 3/10.    Time  6    Period  Weeks    Status  New      PT LONG TERM GOAL #3   Title  Perform ADL's with pain not > 3-4/10.    Time  6    Period  Weeks    Status  New              Patient will benefit from skilled therapeutic intervention in order to improve the following deficits and impairments:  Pain, Decreased activity tolerance, Decreased strength, Postural dysfunction  Visit Diagnosis: Pain in thoracic spine - Plan: PT plan of care cert/re-cert  Abnormal posture - Plan: PT plan of care  cert/re-cert     Problem List Patient Active Problem List   Diagnosis Date Noted  . Essential hypertension 03/09/2016  . PAF (paroxysmal atrial fibrillation) ([place]) 03/09/2016  . IBS (irritable bowel syndrome) 09/17/2013  . Diarrhea 04/24/2013  . Aftercare following surgery of the circulatory system, [place] 01/30/2013  . Peripheral vascular disease, unspecified ([place]) 01/30/2013  . Pain in limb 01/02/2012  . Occlusion and stenosis of carotid artery without mention of cerebral infarction 06/27/2011    Cayla Wiegand,CHRIS, PTA 05/30/2019, 11:26 AM  [facility] [address] [place], [place], [address] Phone: [phone]   Fax:  [phone]  Name: MERLEEN DICE MRN: [id number] Date of Birth: March 28, 1945

## 2019-06-20 ENCOUNTER — Ambulatory Visit: Payer: Medicare HMO | Admitting: *Deleted

## 2019-06-25 ENCOUNTER — Ambulatory Visit: Payer: Medicare HMO | Attending: Family Medicine | Admitting: Physical Therapy

## 2019-06-25 ENCOUNTER — Other Ambulatory Visit: Payer: Self-pay

## 2019-06-25 DIAGNOSIS — M546 Pain in thoracic spine: Secondary | ICD-10-CM | POA: Insufficient documentation

## 2019-06-25 DIAGNOSIS — R293 Abnormal posture: Secondary | ICD-10-CM | POA: Diagnosis present

## 2019-06-25 NOTE — Therapy (Signed)
[facility] [place], [place], [address] Phone: [phone]   Fax:  [phone]  Physical Therapy Treatment  Patient Details  Name: [name] MRN: [id number] Date of Birth: Jul 17, 1945 Referring Provider (PT): Melissa Montane   Encounter Date: 06/25/2019  PT End of Session - 06/25/19 1154    Visit Number  5    Number of Visits  12    Date for PT Re-Evaluation  07/23/19    PT Start Time  0902    PT Stop Time  0931    PT Time Calculation (min)  29 min    Activity Tolerance  Patient tolerated treatment well    Behavior During Therapy  [facility] for tasks assessed/performed       Past Medical History:  Diagnosis Date  . Anxiety   . Carotid artery occlusion 05/08/2007  . 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 Hepatitis   . Hyperlipidemia   . Myocardial infarction ([place])   . Rheumatic fever   . Stroke ([place])   . Thyroid disease    Hypothyroidism    Past Surgical History:  Procedure Laterality Date  . CAROTID ENDARTERECTOMY  11/25/1999   S/P Left  . CAROTID ENDARTERECTOMY  03/24/2004   Left CCA stent- Dr. Amedeo Plenty  . CAROTID STENT  2006  . COLONOSCOPY N/A 05/08/2013   Procedure: COLONOSCOPY;  Surgeon: Rogene Houston, MD;  Location: [facility] ENDO SUITE;  Service: Endoscopy;  Laterality: N/A;  230-moved to [place] notified pt  . PR VEIN BYPASS GRAFT,AORTO-FEM-POP  2001    There were no vitals filed for this visit.  Subjective Assessment - 06/25/19 1151    Subjective  COVID-19 screen performed prior to patient entering clinic.  Pain is an 8.    Pertinent History  MI, CVA, Neck surgery, carotid stent, OP.    Limitations  Standing    How long can you stand comfortably?  <5 minutes.    Patient Stated Goals  Get rid of mid-back pain and do more with less pain.    Currently in Pain?  Yes    Pain Score  8     Pain Location  Back    Pain Descriptors / Indicators  Aching    Pain Type  Chronic pain                        [facility] Adult PT Treatment/Exercise - 06/25/19 0001      Exercises   Exercises  Knee/Hip      Knee/Hip Exercises: Aerobic   Nustep  Level 3 x 5 minutes.      Manual Therapy   Manual Therapy  Soft tissue mobilization    Soft tissue mobilization  STW/M x 18 minutes to affrected thoracic region.                  PT Long Term Goals - 03/26/19 1406      PT LONG TERM GOAL #1   Title  Independent with a HEP.    Time  6    Period  Weeks    Status  New      PT LONG TERM GOAL #2   Title  Stand 20 minutes with pain not > 3/10.    Time  6    Period  Weeks    Status  New      PT LONG TERM GOAL #3   Title  Perform ADL's  with pain not > 3-4/10.    Time  6    Period  Weeks    Status  New            Plan - 06/25/19 1157    Clinical Impression Statement  Patient with continued mid-back pain.  She has been out of therapy for 6 weeks.  She would like to continue.  Recommend consistency of treatments to see results.    Personal Factors and Comorbidities  Comorbidity 1;Comorbidity 2    Comorbidities  MI, CVA, Neck surgery, carotid stent, OP.    Examination-Activity Limitations  Stand    Examination-Participation Restrictions  Cleaning;Other    Stability/Clinical Decision Making  Evolving/Moderate complexity    Rehab Potential  Good    PT Frequency  2x / week    PT Duration  6 weeks    PT Treatment/Interventions  ADLs/Self Care Home Management;Cryotherapy;Electrical Stimulation;Ultrasound;Moist Heat;Therapeutic activities;Therapeutic exercise;Manual techniques;Patient/family education    PT Next Visit Plan  Scapular strengthening. /U/S, STW/M     No heat and estim as per Pt    Consulted and Agree with Plan of Care  Patient       Patient will benefit from skilled therapeutic intervention in order to improve the following deficits and impairments:  Pain, Decreased activity tolerance, Decreased strength, Postural dysfunction  Visit  Diagnosis: Pain in thoracic spine - Plan: PT plan of care cert/re-cert  Abnormal posture - Plan: PT plan of care cert/re-cert     Problem List Patient Active Problem List   Diagnosis Date Noted  . Essential hypertension 03/09/2016  . PAF (paroxysmal atrial fibrillation) ([place]) 03/09/2016  . IBS (irritable bowel syndrome) 09/17/2013  . Diarrhea 04/24/2013  . Aftercare following surgery of the circulatory system, [place] 01/30/2013  . Peripheral vascular disease, unspecified ([place]) 01/30/2013  . Pain in limb 01/02/2012  . Occlusion and stenosis of carotid artery without mention of cerebral infarction 06/27/2011    Brinly Maietta, [place] MPT 06/25/2019, 12:00 PM  [facility] [address] [place], [place], [address] Phone: [phone]   Fax:  [phone]  Name: [name] MRN: [id number] Date of Birth: 1945/09/14

## 2019-07-03 ENCOUNTER — Other Ambulatory Visit: Payer: Self-pay

## 2019-07-03 ENCOUNTER — Encounter: Payer: Self-pay | Admitting: Cardiovascular Disease

## 2019-07-03 ENCOUNTER — Ambulatory Visit: Payer: Medicare HMO | Admitting: Cardiovascular Disease

## 2019-07-03 VITALS — BP 122/76 | HR 66 | Ht 59.0 in | Wt 114.0 lb

## 2019-07-03 DIAGNOSIS — R0989 Other specified symptoms and signs involving the circulatory and respiratory systems: Secondary | ICD-10-CM

## 2019-07-03 DIAGNOSIS — I480 Paroxysmal atrial fibrillation: Secondary | ICD-10-CM | POA: Diagnosis not present

## 2019-07-03 DIAGNOSIS — I739 Peripheral vascular disease, unspecified: Secondary | ICD-10-CM

## 2019-07-03 DIAGNOSIS — E785 Hyperlipidemia, unspecified: Secondary | ICD-10-CM | POA: Diagnosis not present

## 2019-07-03 LAB — LIPID PANEL
Chol/HDL Ratio: 3.1 ratio (ref 0.0–4.4)
Cholesterol, Total: 120 mg/dL (ref 100–199)
HDL: 39 mg/dL — ABNORMAL LOW (ref >39)
LDL Chol Calc (NIH): 58 mg/dL (ref 0–99)
Triglycerides: 126 mg/dL (ref 0–149)
VLDL Cholesterol Cal: 23 mg/dL (ref 5–40)

## 2019-07-03 LAB — HEPATIC FUNCTION PANEL
ALT: 11 IU/L (ref 0–32)
AST: 21 IU/L (ref 0–40)
Albumin: 4.1 g/dL (ref 3.7–4.7)
Alkaline Phosphatase: 83 IU/L (ref 39–117)
Bilirubin Total: 0.2 mg/dL (ref 0.0–1.2)
Bilirubin, Direct: 0.07 mg/dL (ref 0.00–0.40)
Total Protein: 7.1 g/dL (ref 6.0–8.5)

## 2019-07-03 LAB — BASIC METABOLIC PANEL
BUN/Creatinine Ratio: 19 (ref 12–28)
BUN: 21 mg/dL (ref 8–27)
CO2: 31 mmol/L — ABNORMAL HIGH (ref 20–29)
Calcium: 9.4 mg/dL (ref 8.7–10.3)
Chloride: 100 mmol/L (ref 96–106)
Creatinine, Ser: 1.09 mg/dL — ABNORMAL HIGH (ref 0.57–1.00)
GFR calc Af Amer: 57 mL/min/{1.73_m2} — ABNORMAL LOW (ref >59)
GFR calc non Af Amer: 50 mL/min/{1.73_m2} — ABNORMAL LOW (ref >59)
Glucose: 90 mg/dL (ref 65–99)
Potassium: 4.6 mmol/L (ref 3.5–5.2)
Sodium: 142 mmol/L (ref 134–144)

## 2019-07-03 NOTE — Patient Instructions (Signed)
Medication Instructions:  Your physician recommends that you continue on your current medications as directed. Please refer to the Current Medication list given to you today.  *If you need a refill on your cardiac medications before your next appointment, please call your pharmacy*   Lab Work: TODAY - cholesterol, liver panel, basic metabolic panel If you have labs (blood work) drawn today and your tests are completely normal, you will receive your results only by: Marland Kitchen MyChart Message (if you have MyChart) OR . A paper copy in the mail If you have any lab test that is abnormal or we need to change your treatment, we will call you to review the results.   Testing/Procedures: Your physician has requested that you have a carotid duplex. This test is an ultrasound of the carotid arteries in your neck. It looks at blood flow through these arteries that supply the brain with blood. Allow one hour for this exam. There are no restrictions or special instructions.  Your physician has requested that you have a lower extremity arterial duplex. This test is an ultrasound of the arteries in the legs or arms. It looks at arterial blood flow in the legs and arms. Allow one hour for Lower and Upper Arterial scans. There are no restrictions or special instructions    Follow-Up: At [facility], you and your health needs are our priority.  As part of our continuing mission to provide you with exceptional heart care, we have created designated Provider Care Teams.  These Care Teams include your primary Cardiologist (physician) and Advanced Practice Providers (APPs -  Physician Assistants and Nurse Practitioners) who all work together to provide you with the care you need, when you need it.  We recommend signing up for the patient portal called "MyChart".  Sign up information is provided on this After Visit Summary.  MyChart is used to connect with patients for Virtual Visits (Telemedicine).  Patients are able  to view lab/test results, encounter notes, upcoming appointments, etc.  Non-urgent messages can be sent to your provider as well.   To learn more about what you can do with MyChart, go to [email or web address].    Your next appointment:   1 year(s)  The format for your next appointment:   In Person  Provider:   You may see Mertie Moores, MD or one of the following Advanced Practice Providers on your designated Care Team:    Richardson Dopp, PA-C  [place], [place]

## 2019-07-03 NOTE — Progress Notes (Signed)
Cardiology Office Note   Date:  07/03/2019   ID:  [name], [name] June 03, 1945, MRN [id number]  PCP:  Jolinda Croak, MD  Cardiologist:   Mertie Moores, MD   Chief Complaint  Patient presents with  . Atrial Fibrillation  . Hypertension   1. Palpitations 2. PVD - s/p Left carotid endarectomy   3. Hypothyroidism 4. Hyperlipidemia 5. Rheumatic fever  6. Paroxysmal atrial fib   Oct. 18, 2017  [name] is a 74 y.o. female who presents for evaluation of heart palpitations  .She has a history of  ?  coronary artery disease. She has seen someone  in the practice many years ago but does not recall what was. (She thinks that she has had coronary artery bypass grafting but she does not have any evidence of surgery in the form of the scar and her chest x-ray does not show a sternotomy). She also has a history of significant peripheral vascular disease. She continues to smoke.   Was started on bystolic years ago This caused some palpitation  Was seen in the ER with HTN HR was 62 in the ER,   BP was 177/64.  Dr. Octaviano Batty decreased the Bystolic to 5 mg  She stopped taking It completely. Since that time she's had lots of tremulousness. She also has noted that her heart rate is much faster and she's having worse palpitations.  She still has occasional episodes of heart racing, disorientation,  Past several months her vital signs have gradually normalized and she's not having all the tremulousness and hard heart pounding.  He denies any angina. She does have some shortness of breath with exertion.  Her daughters accompanied her to the office visit today. The patient does not drive. She insists that she is very busy but her daughters, that she really does not go anywhere.  Jan. 16, 2018:  Rennert was found to have PAF with her event monitor.She was started on Eliquis 5 mg twice a day. She is also now on carvedilol for rate control.   The Bystolic caused diarrhea.   The   May 28  , 2019: [name] is seen for follow-up of her paroxysmal atrial fibrillation. She needs to have some foot surgery.    She needs to hold Eliquis for 3 days prior to surgery  Breathing has been good.  Still smoking - does not want to stop   Jul 03, 2019:  [name] is seen for follow up of her PAF, she is on Eliqus  Feeling well.  Doing well  No CP or dyspnea.  Has artritis  Did well with her foot surgery  Still smoking  We have discussed in the past .  She has no interest in stopping    Past Medical History:  Diagnosis Date  . Anxiety   . Carotid artery occlusion 05/08/2007  . 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 Hepatitis   . Hyperlipidemia   . Myocardial infarction ([place])   . Rheumatic fever   . Stroke ([place])   . Thyroid disease    Hypothyroidism    Past Surgical History:  Procedure Laterality Date  . CAROTID ENDARTERECTOMY  11/25/1999   S/P Left  . CAROTID ENDARTERECTOMY  03/24/2004   Left CCA stent- Dr. Amedeo Plenty  . CAROTID STENT  2006  . COLONOSCOPY N/A 05/08/2013   Procedure: COLONOSCOPY;  Surgeon: Rogene Houston, MD;  Location: [facility];  Service: Endoscopy;  Laterality: N/A;  230-moved to [place] notified pt  . PR VEIN BYPASS GRAFT,AORTO-FEM-POP  2001     Current Outpatient Medications  Medication Sig Dispense Refill  . apixaban (ELIQUIS) 5 MG TABS tablet Take 1 tablet (5 mg total) by mouth 2 (two) times daily. 60 tablet 11  . budesonide-formoterol (SYMBICORT) 80-4.5 MCG/ACT inhaler Inhale 1 puff into the lungs daily.     . calcium carbonate (OS-CAL - DOSED IN MG OF ELEMENTAL CALCIUM) 1250 (500 Ca) MG tablet Take 1 tablet by mouth daily.    . carvedilol (COREG) 6.25 MG tablet Take 1 tablet (6.25 mg total) by mouth 2 (two) times daily. 60 tablet 11  . cyanocobalamin 1000 MCG tablet Take 1 tablet by mouth daily.    Marland Kitchen dicyclomine (BENTYL) 20 MG tablet Take 20 mg by mouth 2 (two) times daily.    Marland Kitchen HYDROcodone-acetaminophen (NORCO/VICODIN)  5-325 MG tablet Take 1 tablet by mouth every 4 (four) hours as needed. 20 tablet 0  . levothyroxine (LEVOXYL) 125 MCG tablet Take 125 mcg by mouth daily before breakfast.    . LORazepam (ATIVAN) 0.5 MG tablet Take 1.5 mg by mouth at bedtime.     . Multiple Vitamin (MULTIVITAMIN) capsule Take 1 capsule by mouth daily.    . naloxone (NARCAN) nasal spray 4 mg/0.1 mL Place 1 spray into the nose as needed.    . rosuvastatin (CRESTOR) 20 MG tablet Take 20 mg by mouth daily.    Marland Kitchen tiZANidine (ZANAFLEX) 4 MG tablet Take 1 tablet by mouth as needed.     No current facility-administered medications for this visit.    Allergies:   Penicillins, Morphine and related, and Losartan    Social History:  The patient  reports that she has been smoking cigarettes. She has a 10.00 pack-year smoking history. She has never used smokeless tobacco. She reports that she does not drink alcohol or use drugs.   Family History:  The patient's family history includes Cancer in her brother, brother, and sister; Heart attack in her mother; Hyperlipidemia in her mother; Hypertension in her father; Kidney disease in her brother.    ROS: Noted in current history, otherwise review of systems is negative.  Physical Exam: Blood pressure 122/76, pulse 66, height 4\' 11"  (1.499 m), weight 114 lb (51.7 kg), SpO2 94 %.  GEN:  Well nourished, well developed in no acute distress HEENT: Normal NECK: No JVD; L carotid bruit  LYMPHATICS: No lymphadenopathy CARDIAC: RRR , no murmurs, rubs, gallops RESPIRATORY:  Clear to auscultation without rales, wheezing or rhonchi  ABDOMEN:  Soft.   + bruit, LLQ  MUSCULOSKELETAL:  No edema; No deformity  SKIN: Warm and dry NEUROLOGIC:  Alert and oriented x 3   EKG:   Jul 03 2019:  NSR at 52.  1st degree AV  Block   Recent Labs: No results found for requested labs within last 8760 hours.    Lipid Panel No results found for: CHOL, TRIG, HDL, CHOLHDL, VLDL, LDLCALC, LDLDIRECT    Wt  Readings from Last 3 Encounters:  07/03/19 114 lb (51.7 kg)  07/18/17 118 lb (53.5 kg)  02/23/17 118 lb (53.5 kg)      Other studies Reviewed: Additional studies/ records that were reviewed today include: . Review of the above records demonstrates:    ASSESSMENT AND PLAN:  1.  Paroxysmal atrial fibrillation:   Continue eliquis , she is in sinus rhythm currently.  2. Hypertension:    BP is well controlled   3. Peripheral  vascular disease:   Has a LLQ bruit.    She denies any claudication  Symptom  Left carotid bruit  Its been 5 years since her last carotid duplex scan.  We will repeat her carotid duplex scan.  I would like to get lower extremity duplex scan.  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  I have advised her once again to stop smoking.  She has no interest in stopping.    Current medicines are reviewed at length with the patient today.  The patient does not have concerns regarding medicines.  Labs/ tests ordered today include:   Orders Placed This Encounter  Procedures  . Lipid Profile  . Basic Metabolic Panel (BMET)  . Hepatic function panel  . EKG 12-Lead  . VAS US CAROTID  . VAS [place] LOWER EXTREMITY ARTERIAL DUPLEX     Disposition:   FU with me in 1 year     Mertie Moores, MD  07/03/2019 5:58 PM    [facility] [place], [place], [place]  [address] Phone: [phone]; Fax: [phone]

## 2019-07-04 ENCOUNTER — Telehealth: Payer: Self-pay | Admitting: Cardiovascular Disease

## 2019-07-04 NOTE — Telephone Encounter (Signed)
Patient is returning call to Harland German to discuss results from lab work completed on 07/03/19. Curt Bears requested that I route to [facility] pool. Please call patient to discuss.

## 2019-07-04 NOTE — Telephone Encounter (Signed)
I spoke with patient and reviewed lab results from 5/12 with her

## 2019-07-08 ENCOUNTER — Other Ambulatory Visit (HOSPITAL_COMMUNITY): Payer: Self-pay | Admitting: Cardiovascular Disease

## 2019-07-08 DIAGNOSIS — I739 Peripheral vascular disease, unspecified: Secondary | ICD-10-CM

## 2019-07-09 ENCOUNTER — Ambulatory Visit: Payer: Medicare HMO | Admitting: Physical Therapy

## 2019-07-16 ENCOUNTER — Ambulatory Visit: Payer: Medicare HMO | Admitting: Physical Therapy

## 2019-07-23 ENCOUNTER — Other Ambulatory Visit: Payer: Self-pay

## 2019-07-23 ENCOUNTER — Ambulatory Visit (HOSPITAL_COMMUNITY)
Admission: RE | Admit: 2019-07-23 | Discharge: 2019-07-23 | Disposition: A | Discharge status: Home / Self Care | Payer: Medicare HMO | Source: Ambulatory Visit | Attending: Cardiology | Admitting: Cardiology

## 2019-07-23 ENCOUNTER — Other Ambulatory Visit: Payer: Self-pay | Admitting: Cardiovascular Disease

## 2019-07-23 ENCOUNTER — Other Ambulatory Visit (HOSPITAL_COMMUNITY): Payer: Self-pay | Admitting: Cardiovascular Disease

## 2019-07-23 ENCOUNTER — Ambulatory Visit (HOSPITAL_BASED_OUTPATIENT_CLINIC_OR_DEPARTMENT_OTHER)
Admission: RE | Admit: 2019-07-23 | Discharge: 2019-07-23 | Disposition: A | Discharge status: Home / Self Care | Payer: Medicare HMO | Source: Ambulatory Visit | Attending: Cardiovascular Disease | Admitting: Cardiovascular Disease

## 2019-07-23 ENCOUNTER — Other Ambulatory Visit: Payer: Self-pay | Admitting: Nurse Practitioner

## 2019-07-23 DIAGNOSIS — R0989 Other specified symptoms and signs involving the circulatory and respiratory systems: Secondary | ICD-10-CM

## 2019-07-23 DIAGNOSIS — I739 Peripheral vascular disease, unspecified: Secondary | ICD-10-CM

## 2019-07-23 DIAGNOSIS — E785 Hyperlipidemia, unspecified: Secondary | ICD-10-CM

## 2019-07-23 DIAGNOSIS — I480 Paroxysmal atrial fibrillation: Secondary | ICD-10-CM | POA: Insufficient documentation

## 2019-07-23 DIAGNOSIS — Z95828 Presence of other vascular implants and grafts: Secondary | ICD-10-CM

## 2019-07-23 DIAGNOSIS — I6523 Occlusion and stenosis of bilateral carotid arteries: Secondary | ICD-10-CM

## 2019-10-29 ENCOUNTER — Other Ambulatory Visit: Payer: Medicare HMO

## 2019-10-29 ENCOUNTER — Other Ambulatory Visit: Payer: Self-pay | Admitting: Critical Care Medicine

## 2019-10-29 DIAGNOSIS — Z20822 Contact with and (suspected) exposure to covid-19: Secondary | ICD-10-CM

## 2019-10-30 LAB — SARS-COV-2, NAA 2 DAY TAT

## 2019-10-30 LAB — NOVEL CORONAVIRUS, NAA: SARS-CoV-2, NAA: NOT DETECTED

## 2019-11-22 NOTE — Telephone Encounter (Signed)
Spoke with the patient who states that she is having kidney issues and needs to see a nephrologist. She went to see a provider yesterday and she thought that it was for her kidneys, however it was a hernia specialist. She states that she was not sure who sent her there and is very upset because she is having kidney problems and abdominal pain. I advised her that she was recently seen by urology and she should call their office back or talk with her PCP about a referral to a nephrologist. Patient verbalized understanding.  Patient states that she is feeling good otherwise and is not having any cardiac issues or concerns. Denies and shortness of breath, swelling, chest pain, or dizziness. States that her blood pressure has been okay. Advised to call back if any cardiac concerns arise.

## 2019-12-17 ENCOUNTER — Other Ambulatory Visit: Payer: Self-pay | Admitting: *Deleted

## 2019-12-17 DIAGNOSIS — I739 Peripheral vascular disease, unspecified: Secondary | ICD-10-CM

## 2020-01-06 ENCOUNTER — Ambulatory Visit: Payer: Medicare HMO | Admitting: Vascular Surgery

## 2020-01-06 ENCOUNTER — Other Ambulatory Visit: Payer: Self-pay

## 2020-01-06 ENCOUNTER — Encounter: Payer: Self-pay | Admitting: Vascular Surgery

## 2020-01-06 VITALS — BP 134/76 | HR 56 | Temp 97.9°F | Resp 18 | Ht 59.0 in | Wt 112.0 lb

## 2020-01-06 DIAGNOSIS — I739 Peripheral vascular disease, unspecified: Secondary | ICD-10-CM

## 2020-01-06 NOTE — Progress Notes (Signed)
[facility] [place]  Patient name: [name] MRN: [id number] DOB: 1945-03-09 Sex: female  REASON FOR CONSULT: Evaluation issue with left groin incision  HPI: [name] is a 74 y.o. female, here today for evaluation of her left groin.  She is here with her daughter.  She has a very extensive past history.  She had undergone prior left carotid endarterectomy and also left common carotid stenting.  She is also status post aortobifemoral bypass with Dr. Kellie Simmering in 2006.  She has had no difficulty with lower extremity arterial insufficiency.  Over the past several months she has noticed a draining area in her left groin.  She has had no fevers.  This area will drain and then resolve and then recur.  He was referred to Dr. Rosendo Gros with [facility] surgery who noted her prior history of aortobifemoral bypass and appropriately referred her to [place] for further evaluation.  She is on chronic anticoagulation for cardiac issues.  She has not had any bleeding from this area.  Past Medical History:  Diagnosis Date  . Anxiety   . Carotid artery occlusion 05/08/2007  . 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 Hepatitis   . Hyperlipidemia   . Myocardial infarction ([place])   . Rheumatic fever   . Stroke ([place])   . Thyroid disease    Hypothyroidism    Family History  Problem Relation Age of Onset  . Cancer Sister   . Hypertension Father   . Heart attack Mother   . Hyperlipidemia Mother   . Kidney disease Brother   . Cancer Brother   . Cancer Brother        Lung   . Colon cancer Neg Hx     SOCIAL HISTORY: Social History   Socioeconomic History  . Marital status: Married    Spouse name: Not on file  . Number of children: Not on file  . Years of education: Not on file  . Highest education level: Not on file  Occupational History  . Not on file  Tobacco Use  . Smoking status: Current Every Day Smoker    Packs/day: 0.25     Years: 40.00    Pack years: 10.00    Types: Cigarettes  . Smokeless tobacco: Never Used  . Tobacco comment: 1 pack every 3 days x 46yrs  Substance and Sexual Activity  . Alcohol use: No  . Drug use: No  . Sexual activity: Not on file  Other Topics Concern  . Not on file  Social History Narrative  . Not on file   Social Determinants of Health   Financial Resource Strain:   . Difficulty of Paying Living Expenses: Not on file  Food Insecurity:   . Worried About Charity fundraiser in the Last Year: Not on file  . Ran Out of Food in the Last Year: Not on file  Transportation Needs:   . Lack of Transportation (Medical): Not on file  . Lack of Transportation (Non-Medical): Not on file  Physical Activity:   . Days of Exercise per Week: Not on file  . Minutes of Exercise per Session: Not on file  Stress:   . Feeling of Stress : Not on file  Social Connections:   . Frequency of Communication with Friends and Family: Not on file  . Frequency of Social Gatherings with Friends and Family: Not on file  . Attends Religious Services: Not on  file  . Active Member of Clubs or Organizations: Not on file  . Attends Archivist Meetings: Not on file  . Marital Status: Not on file  Intimate Partner Violence:   . Fear of Current or Ex-Partner: Not on file  . Emotionally Abused: Not on file  . Physically Abused: Not on file  . Sexually Abused: Not on file    Allergies  Allergen Reactions  . Penicillins Swelling  . Morphine And Related Nausea And Vomiting  . Losartan Nausea Only    Nausea and dizzy    Current Outpatient Medications  Medication Sig Dispense Refill  . apixaban (ELIQUIS) 5 MG TABS tablet Take 1 tablet (5 mg total) by mouth 2 (two) times daily. 60 tablet 11  . budesonide-formoterol (SYMBICORT) 80-4.5 MCG/ACT inhaler Inhale 1 puff into the lungs daily.     . calcium carbonate (OS-CAL - DOSED IN MG OF ELEMENTAL CALCIUM) 1250 (500 Ca) MG tablet Take 1 tablet by mouth  daily.    . carvedilol (COREG) 6.25 MG tablet Take 1 tablet (6.25 mg total) by mouth 2 (two) times daily. 60 tablet 11  . cyanocobalamin 1000 MCG tablet Take 1 tablet by mouth daily.    Marland Kitchen dicyclomine (BENTYL) 20 MG tablet Take 20 mg by mouth 2 (two) times daily.    Marland Kitchen HYDROcodone-acetaminophen (NORCO/VICODIN) 5-325 MG tablet Take 1 tablet by mouth every 4 (four) hours as needed. 20 tablet 0  . levothyroxine (LEVOXYL) 125 MCG tablet Take 125 mcg by mouth daily before breakfast.    . LORazepam (ATIVAN) 0.5 MG tablet Take 1.5 mg by mouth at bedtime.     . Multiple Vitamin (MULTIVITAMIN) capsule Take 1 capsule by mouth daily.    . naloxone (NARCAN) nasal spray 4 mg/0.1 mL Place 1 spray into the nose as needed.    . rosuvastatin (CRESTOR) 20 MG tablet Take 20 mg by mouth daily.    Marland Kitchen tiZANidine (ZANAFLEX) 4 MG tablet Take 1 tablet by mouth as needed.     No current facility-administered medications for this visit.    REVIEW OF SYSTEMS:  [X]  denotes positive finding, [ ]  denotes negative finding Cardiac  Comments:  Chest pain or chest pressure:    Shortness of breath upon exertion:    Short of breath when lying flat:    Irregular heart rhythm: x       Vascular    Pain in calf, thigh, or hip brought on by ambulation:    Pain in feet at night that wakes you up from your sleep:     Blood clot in your veins:    Leg swelling:         Pulmonary    Oxygen at home:    Productive cough:     Wheezing:         Neurologic    Sudden weakness in arms or legs:     Sudden numbness in arms or legs:     Sudden onset of difficulty speaking or slurred speech:    Temporary loss of vision in one eye:     Problems with dizziness:         Gastrointestinal    Blood in stool:     Vomited blood:         Genitourinary    Burning when urinating:     Blood in urine:        Psychiatric    Major depression:         Hematologic  Bleeding problems:    Problems with blood clotting too easily:          Skin    Rashes or ulcers:        Constitutional    Fever or chills:      PHYSICAL EXAM: Vitals:   01/06/20 0834  BP: 134/76  Pulse: (!) 56  Resp: 18  Temp: 97.9 F (36.6 C)  TempSrc: Other (Comment)  Weight: 112 lb (50.8 kg)  Height: 4\' 11"  (1.499 m)    GENERAL: The patient is a well-nourished female, in no acute distress. The vital signs are documented above. VASCULAR: Harsh left carotid bruit and no bruit on the right.  2+ radial pulses bilaterally.  2+ femoral pulses bilaterally.  Palpable popliteal pulses bilaterally.  2+ left dorsalis pedis and faint right dorsalis pedis pulse PULMONARY: There is good air exchange ABDOMEN: Soft and non-tender  MUSCULOSKELETAL: There are no major deformities or cyanosis. NEUROLOGIC: No focal weakness or paresthesias are detected. SKIN: Her left groin reveals a small punctate area that appears to be open sinus.  There is no  fluctuance in this area.  There is no surrounding erythema. PSYCHIATRIC: The patient has a normal affect.  DATA:   Carotid duplex from June 2021 shows no evidence of severe stenosis in her carotid bilaterally  Lower extremity noninvasive studies from June 2021 revealed normal ankle arm index bilaterally.  Abdominal duplex revealed bilateral renal cyst which is being evaluated further.  MEDICAL ISSUES:  I had long discussion with the patient and her daughter.  This does appear to be superficial and does not have any fluctuance or erythema.  I did explain the concern that it is overlying the old left femoral limb of her aortobifemoral bypass from 2006.  I have recommended exploration and excision of this area in the operating room.  I explained that if it did involve the left limb of the graft that further treatment would be required.  Splane the potential for long-term antibiotics and even potential need for excision of this portion of her graft.  We will plan outpatient surgery at [facility] in the next several weeks at  her convenience.  She is on chronic Eliquis.  We will stop this several days prior to surgery and resume immediately following.   Rosetta Posner, MD FACS [facility] Office phone [phone]

## 2020-01-06 NOTE — H&P (View-Only) (Signed)
[facility] [place]  Patient name: [name] MRN: [id number] DOB: 1945-12-05 Sex: female  REASON FOR CONSULT: Evaluation issue with left groin incision  HPI: [name] is a 74 y.o. female, here today for evaluation of her left groin.  She is here with her daughter.  She has a very extensive past history.  She had undergone prior left carotid endarterectomy and also left common carotid stenting.  She is also status post aortobifemoral bypass with Dr. Kellie Simmering in 2006.  She has had no difficulty with lower extremity arterial insufficiency.  Over the past several months she has noticed a draining area in her left groin.  She has had no fevers.  This area will drain and then resolve and then recur.  He was referred to Dr. Rosendo Gros with [facility] surgery who noted her prior history of aortobifemoral bypass and appropriately referred her to [place] for further evaluation.  She is on chronic anticoagulation for cardiac issues.  She has not had any bleeding from this area.  Past Medical History:  Diagnosis Date  . Anxiety   . Carotid artery occlusion 05/08/2007  . 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 Hepatitis   . Hyperlipidemia   . Myocardial infarction ([place])   . Rheumatic fever   . Stroke ([place])   . Thyroid disease    Hypothyroidism    Family History  Problem Relation Age of Onset  . Cancer Sister   . Hypertension Father   . Heart attack Mother   . Hyperlipidemia Mother   . Kidney disease Brother   . Cancer Brother   . Cancer Brother        Lung   . Colon cancer Neg Hx     SOCIAL HISTORY: Social History   Socioeconomic History  . Marital status: Married    Spouse name: Not on file  . Number of children: Not on file  . Years of education: Not on file  . Highest education level: Not on file  Occupational History  . Not on file  Tobacco Use  . Smoking status: Current Every Day Smoker    Packs/day: 0.25     Years: 40.00    Pack years: 10.00    Types: Cigarettes  . Smokeless tobacco: Never Used  . Tobacco comment: 1 pack every 3 days x 35yrs  Substance and Sexual Activity  . Alcohol use: No  . Drug use: No  . Sexual activity: Not on file  Other Topics Concern  . Not on file  Social History Narrative  . Not on file   Social Determinants of Health   Financial Resource Strain:   . Difficulty of Paying Living Expenses: Not on file  Food Insecurity:   . Worried About Charity fundraiser in the Last Year: Not on file  . Ran Out of Food in the Last Year: Not on file  Transportation Needs:   . Lack of Transportation (Medical): Not on file  . Lack of Transportation (Non-Medical): Not on file  Physical Activity:   . Days of Exercise per Week: Not on file  . Minutes of Exercise per Session: Not on file  Stress:   . Feeling of Stress : Not on file  Social Connections:   . Frequency of Communication with Friends and Family: Not on file  . Frequency of Social Gatherings with Friends and Family: Not on file  . Attends Religious Services: Not on  file  . Active Member of Clubs or Organizations: Not on file  . Attends Archivist Meetings: Not on file  . Marital Status: Not on file  Intimate Partner Violence:   . Fear of Current or Ex-Partner: Not on file  . Emotionally Abused: Not on file  . Physically Abused: Not on file  . Sexually Abused: Not on file    Allergies  Allergen Reactions  . Penicillins Swelling  . Morphine And Related Nausea And Vomiting  . Losartan Nausea Only    Nausea and dizzy    Current Outpatient Medications  Medication Sig Dispense Refill  . apixaban (ELIQUIS) 5 MG TABS tablet Take 1 tablet (5 mg total) by mouth 2 (two) times daily. 60 tablet 11  . budesonide-formoterol (SYMBICORT) 80-4.5 MCG/ACT inhaler Inhale 1 puff into the lungs daily.     . calcium carbonate (OS-CAL - DOSED IN MG OF ELEMENTAL CALCIUM) 1250 (500 Ca) MG tablet Take 1 tablet by mouth  daily.    . carvedilol (COREG) 6.25 MG tablet Take 1 tablet (6.25 mg total) by mouth 2 (two) times daily. 60 tablet 11  . cyanocobalamin 1000 MCG tablet Take 1 tablet by mouth daily.    Marland Kitchen dicyclomine (BENTYL) 20 MG tablet Take 20 mg by mouth 2 (two) times daily.    Marland Kitchen HYDROcodone-acetaminophen (NORCO/VICODIN) 5-325 MG tablet Take 1 tablet by mouth every 4 (four) hours as needed. 20 tablet 0  . levothyroxine (LEVOXYL) 125 MCG tablet Take 125 mcg by mouth daily before breakfast.    . LORazepam (ATIVAN) 0.5 MG tablet Take 1.5 mg by mouth at bedtime.     . Multiple Vitamin (MULTIVITAMIN) capsule Take 1 capsule by mouth daily.    . naloxone (NARCAN) nasal spray 4 mg/0.1 mL Place 1 spray into the nose as needed.    . rosuvastatin (CRESTOR) 20 MG tablet Take 20 mg by mouth daily.    Marland Kitchen tiZANidine (ZANAFLEX) 4 MG tablet Take 1 tablet by mouth as needed.     No current facility-administered medications for this visit.    REVIEW OF SYSTEMS:  [X]  denotes positive finding, [ ]  denotes negative finding Cardiac  Comments:  Chest pain or chest pressure:    Shortness of breath upon exertion:    Short of breath when lying flat:    Irregular heart rhythm: x       Vascular    Pain in calf, thigh, or hip brought on by ambulation:    Pain in feet at night that wakes you up from your sleep:     Blood clot in your veins:    Leg swelling:         Pulmonary    Oxygen at home:    Productive cough:     Wheezing:         Neurologic    Sudden weakness in arms or legs:     Sudden numbness in arms or legs:     Sudden onset of difficulty speaking or slurred speech:    Temporary loss of vision in one eye:     Problems with dizziness:         Gastrointestinal    Blood in stool:     Vomited blood:         Genitourinary    Burning when urinating:     Blood in urine:        Psychiatric    Major depression:         Hematologic  Bleeding problems:    Problems with blood clotting too easily:         Skin    Rashes or ulcers:        Constitutional    Fever or chills:      PHYSICAL EXAM: Vitals:   01/06/20 0834  BP: 134/76  Pulse: (!) 56  Resp: 18  Temp: 97.9 F (36.6 C)  TempSrc: Other (Comment)  Weight: 112 lb (50.8 kg)  Height: 4\' 11"  (1.499 m)    GENERAL: The patient is a well-nourished female, in no acute distress. The vital signs are documented above. VASCULAR: Harsh left carotid bruit and no bruit on the right.  2+ radial pulses bilaterally.  2+ femoral pulses bilaterally.  Palpable popliteal pulses bilaterally.  2+ left dorsalis pedis and faint right dorsalis pedis pulse PULMONARY: There is good air exchange ABDOMEN: Soft and non-tender  MUSCULOSKELETAL: There are no major deformities or cyanosis. NEUROLOGIC: No focal weakness or paresthesias are detected. SKIN: Her left groin reveals a small punctate area that appears to be open sinus.  There is no  fluctuance in this area.  There is no surrounding erythema. PSYCHIATRIC: The patient has a normal affect.  DATA:   Carotid duplex from June 2021 shows no evidence of severe stenosis in her carotid bilaterally  Lower extremity noninvasive studies from June 2021 revealed normal ankle arm index bilaterally.  Abdominal duplex revealed bilateral renal cyst which is being evaluated further.  MEDICAL ISSUES:  I had long discussion with the patient and her daughter.  This does appear to be superficial and does not have any fluctuance or erythema.  I did explain the concern that it is overlying the old left femoral limb of her aortobifemoral bypass from 2006.  I have recommended exploration and excision of this area in the operating room.  I explained that if it did involve the left limb of the graft that further treatment would be required.  Splane the potential for long-term antibiotics and even potential need for excision of this portion of her graft.  We will plan outpatient surgery at [facility] in the next several weeks at  her convenience.  She is on chronic Eliquis.  We will stop this several days prior to surgery and resume immediately following.   Rosetta Posner, MD FACS [facility] [place] Office phone [phone]

## 2020-01-07 ENCOUNTER — Encounter: Payer: Medicare HMO | Admitting: Vascular Surgery

## 2020-01-07 ENCOUNTER — Encounter (HOSPITAL_COMMUNITY): Payer: Medicare HMO

## 2020-01-09 ENCOUNTER — Other Ambulatory Visit: Payer: Self-pay

## 2020-01-21 ENCOUNTER — Other Ambulatory Visit: Payer: Self-pay

## 2020-01-21 ENCOUNTER — Other Ambulatory Visit (HOSPITAL_COMMUNITY)
Admission: RE | Admit: 2020-01-21 | Discharge: 2020-01-21 | Disposition: A | Discharge status: Home / Self Care | Payer: Medicare HMO | Source: Ambulatory Visit | Attending: Vascular Surgery | Admitting: Vascular Surgery

## 2020-01-21 ENCOUNTER — Encounter (HOSPITAL_COMMUNITY): Payer: Self-pay | Admitting: Vascular Surgery

## 2020-01-21 DIAGNOSIS — Z01812 Encounter for preprocedural laboratory examination: Secondary | ICD-10-CM | POA: Insufficient documentation

## 2020-01-21 DIAGNOSIS — Z20822 Contact with and (suspected) exposure to covid-19: Secondary | ICD-10-CM | POA: Insufficient documentation

## 2020-01-21 LAB — SARS CORONAVIRUS 2 (TAT 6-24 HRS): SARS Coronavirus 2: NEGATIVE

## 2020-01-21 NOTE — Progress Notes (Signed)
[name] denies chest pain or shortness of breath. Patient was tested for Covid and has been in quarantine since that time.   [name] reports that she had a rough time when her PCP stopped the Ativan "cold [place]."  It took 6 weeks to get over it- it was the first week of OCT.

## 2020-01-22 ENCOUNTER — Encounter (HOSPITAL_COMMUNITY): Payer: Self-pay | Admitting: Vascular Surgery

## 2020-01-22 ENCOUNTER — Ambulatory Visit (HOSPITAL_COMMUNITY): Payer: Medicare HMO | Admitting: Certified Registered"

## 2020-01-22 ENCOUNTER — Inpatient Hospital Stay (HOSPITAL_COMMUNITY)
Admission: AD | Admit: 2020-01-22 | Discharge: 2020-01-24 | DRG: 989 | Disposition: A | Discharge status: Home Health | Payer: Medicare HMO | Attending: Vascular Surgery | Admitting: Vascular Surgery

## 2020-01-22 ENCOUNTER — Encounter (HOSPITAL_COMMUNITY)
Admission: AD | Disposition: A | Discharge status: Home Health | Payer: Self-pay | Source: Home / Self Care | Attending: Vascular Surgery

## 2020-01-22 DIAGNOSIS — F32A Depression, unspecified: Secondary | ICD-10-CM | POA: Diagnosis present

## 2020-01-22 DIAGNOSIS — Z7901 Long term (current) use of anticoagulants: Secondary | ICD-10-CM | POA: Diagnosis not present

## 2020-01-22 DIAGNOSIS — I252 Old myocardial infarction: Secondary | ICD-10-CM | POA: Diagnosis not present

## 2020-01-22 DIAGNOSIS — F17210 Nicotine dependence, cigarettes, uncomplicated: Secondary | ICD-10-CM | POA: Diagnosis present

## 2020-01-22 DIAGNOSIS — Z79899 Other long term (current) drug therapy: Secondary | ICD-10-CM

## 2020-01-22 DIAGNOSIS — Z8673 Personal history of transient ischemic attack (TIA), and cerebral infarction without residual deficits: Secondary | ICD-10-CM | POA: Diagnosis not present

## 2020-01-22 DIAGNOSIS — Z20822 Contact with and (suspected) exposure to covid-19: Secondary | ICD-10-CM | POA: Diagnosis present

## 2020-01-22 DIAGNOSIS — Z880 Allergy status to penicillin: Secondary | ICD-10-CM

## 2020-01-22 DIAGNOSIS — Z8249 Family history of ischemic heart disease and other diseases of the circulatory system: Secondary | ICD-10-CM | POA: Diagnosis not present

## 2020-01-22 DIAGNOSIS — Z885 Allergy status to narcotic agent status: Secondary | ICD-10-CM

## 2020-01-22 DIAGNOSIS — E039 Hypothyroidism, unspecified: Secondary | ICD-10-CM | POA: Diagnosis present

## 2020-01-22 DIAGNOSIS — I4891 Unspecified atrial fibrillation: Secondary | ICD-10-CM | POA: Diagnosis present

## 2020-01-22 DIAGNOSIS — Z7951 Long term (current) use of inhaled steroids: Secondary | ICD-10-CM | POA: Diagnosis not present

## 2020-01-22 DIAGNOSIS — E785 Hyperlipidemia, unspecified: Secondary | ICD-10-CM | POA: Diagnosis present

## 2020-01-22 DIAGNOSIS — F419 Anxiety disorder, unspecified: Secondary | ICD-10-CM | POA: Diagnosis present

## 2020-01-22 DIAGNOSIS — I770 Arteriovenous fistula, acquired: Secondary | ICD-10-CM

## 2020-01-22 DIAGNOSIS — T827XXA Infection and inflammatory reaction due to other cardiac and vascular devices, implants and grafts, initial encounter: Principal | ICD-10-CM

## 2020-01-22 DIAGNOSIS — Z83438 Family history of other disorder of lipoprotein metabolism and other lipidemia: Secondary | ICD-10-CM | POA: Diagnosis not present

## 2020-01-22 DIAGNOSIS — Z841 Family history of disorders of kidney and ureter: Secondary | ICD-10-CM | POA: Diagnosis not present

## 2020-01-22 DIAGNOSIS — Z95828 Presence of other vascular implants and grafts: Secondary | ICD-10-CM

## 2020-01-22 DIAGNOSIS — Z888 Allergy status to other drugs, medicaments and biological substances status: Secondary | ICD-10-CM

## 2020-01-22 DIAGNOSIS — I739 Peripheral vascular disease, unspecified: Secondary | ICD-10-CM

## 2020-01-22 DIAGNOSIS — Y832 Surgical operation with anastomosis, bypass or graft as the cause of abnormal reaction of the patient, or of later complication, without mention of misadventure at the time of the procedure: Secondary | ICD-10-CM | POA: Diagnosis present

## 2020-01-22 DIAGNOSIS — T8149XA Infection following a procedure, other surgical site, initial encounter: Secondary | ICD-10-CM | POA: Diagnosis present

## 2020-01-22 DIAGNOSIS — T8141XA Infection following a procedure, superficial incisional surgical site, initial encounter: Secondary | ICD-10-CM | POA: Diagnosis present

## 2020-01-22 HISTORY — DX: Irritable bowel syndrome, unspecified: K58.9

## 2020-01-22 HISTORY — DX: Peripheral vascular disease, unspecified: I73.9

## 2020-01-22 HISTORY — DX: Depression, unspecified: F32.A

## 2020-01-22 HISTORY — PX: WOUND DEBRIDEMENT: SHX247

## 2020-01-22 LAB — COMPREHENSIVE METABOLIC PANEL
ALT: 14 U/L (ref 0–44)
AST: 24 U/L (ref 15–41)
Albumin: 3.6 g/dL (ref 3.5–5.0)
Alkaline Phosphatase: 58 U/L (ref 38–126)
Anion gap: 12 (ref 5–15)
BUN: 21 mg/dL (ref 8–23)
CO2: 28 mmol/L (ref 22–32)
Calcium: 9.4 mg/dL (ref 8.9–10.3)
Chloride: 99 mmol/L (ref 98–111)
Creatinine, Ser: 1.08 mg/dL — ABNORMAL HIGH (ref 0.44–1.00)
GFR, Estimated: 53 mL/min — ABNORMAL LOW (ref >60)
Glucose, Bld: 105 mg/dL — ABNORMAL HIGH (ref 70–99)
Potassium: 4.2 mmol/L (ref 3.5–5.1)
Sodium: 139 mmol/L (ref 135–145)
Total Bilirubin: 0.5 mg/dL (ref 0.3–1.2)
Total Protein: 6.9 g/dL (ref 6.5–8.1)

## 2020-01-22 LAB — CBC
HCT: 33.9 % — ABNORMAL LOW (ref 36.0–46.0)
Hemoglobin: 10.5 g/dL — ABNORMAL LOW (ref 12.0–15.0)
MCH: 27.1 pg (ref 26.0–34.0)
MCHC: 31 g/dL (ref 30.0–36.0)
MCV: 87.4 fL (ref 80.0–100.0)
Platelets: 237 10*3/uL (ref 150–400)
RBC: 3.88 MIL/uL (ref 3.87–5.11)
RDW: 14 % (ref 11.5–15.5)
WBC: 8 10*3/uL (ref 4.0–10.5)
nRBC: 0 % (ref 0.0–0.2)

## 2020-01-22 LAB — CREATININE, SERUM
Creatinine, Ser: 0.94 mg/dL (ref 0.44–1.00)
GFR, Estimated: >60 mL/min (ref >60)

## 2020-01-22 SURGERY — DEBRIDEMENT, WOUND
Anesthesia: General | Laterality: Left

## 2020-01-22 MED ORDER — ROSUVASTATIN CALCIUM 20 MG PO TABS
20.0000 mg | ORAL_TABLET | Freq: Every day | ORAL | Status: DC
  Administered 2020-01-23 – 2020-01-24 (×2): 20 mg via ORAL
  Filled 2020-01-22 (×2): qty 1

## 2020-01-22 MED ORDER — HYDROCODONE-ACETAMINOPHEN 5-325 MG PO TABS: 1.0000 | ORAL_TABLET | ORAL | 0 refills | Status: DC | PRN

## 2020-01-22 MED ORDER — CHLORHEXIDINE GLUCONATE 4 % EX LIQD: 60.0000 mL | Freq: Once | CUTANEOUS | Status: DC

## 2020-01-22 MED ORDER — EPHEDRINE 5 MG/ML INJ
INTRAVENOUS | Status: AC
  Filled 2020-01-22: qty 10

## 2020-01-22 MED ORDER — GLYCOPYRROLATE PF 0.2 MG/ML IJ SOSY
PREFILLED_SYRINGE | INTRAMUSCULAR | Status: AC
  Filled 2020-01-22: qty 1

## 2020-01-22 MED ORDER — POTASSIUM CHLORIDE CRYS ER 20 MEQ PO TBCR: 20.0000 meq | EXTENDED_RELEASE_TABLET | Freq: Every day | ORAL | Status: DC | PRN

## 2020-01-22 MED ORDER — CARVEDILOL 6.25 MG PO TABS
6.2500 mg | ORAL_TABLET | Freq: Two times a day (BID) | ORAL | Status: DC
  Administered 2020-01-22 – 2020-01-24 (×2): 6.25 mg via ORAL
  Filled 2020-01-22 (×2): qty 1

## 2020-01-22 MED ORDER — GLYCOPYRROLATE PF 0.2 MG/ML IJ SOSY
PREFILLED_SYRINGE | INTRAMUSCULAR | Status: DC | PRN
  Administered 2020-01-22: .2 mg via INTRAVENOUS

## 2020-01-22 MED ORDER — ACETAMINOPHEN 10 MG/ML IV SOLN: 1000.0000 mg | Freq: Once | INTRAVENOUS | Status: DC | PRN

## 2020-01-22 MED ORDER — ACETAMINOPHEN 325 MG PO TABS: 325.0000 mg | ORAL_TABLET | ORAL | Status: DC | PRN

## 2020-01-22 MED ORDER — OXYCODONE HCL 5 MG PO TABS
5.0000 mg | ORAL_TABLET | ORAL | Status: DC | PRN
  Administered 2020-01-22 – 2020-01-23 (×3): 5 mg via ORAL
  Administered 2020-01-23: 10 mg via ORAL
  Filled 2020-01-22: qty 2
  Filled 2020-01-22 (×4): qty 1

## 2020-01-22 MED ORDER — SODIUM CHLORIDE 0.9 % IV SOLN: INTRAVENOUS | Status: DC

## 2020-01-22 MED ORDER — FENTANYL CITRATE (PF) 250 MCG/5ML IJ SOLN
INTRAMUSCULAR | Status: DC | PRN
  Administered 2020-01-22: 50 ug via INTRAVENOUS
  Administered 2020-01-22: 25 ug via INTRAVENOUS

## 2020-01-22 MED ORDER — ONDANSETRON HCL 4 MG/2ML IJ SOLN
INTRAMUSCULAR | Status: AC
  Filled 2020-01-22: qty 2

## 2020-01-22 MED ORDER — ONDANSETRON HCL 4 MG/2ML IJ SOLN: 4.0000 mg | Freq: Four times a day (QID) | INTRAMUSCULAR | Status: DC | PRN

## 2020-01-22 MED ORDER — FENTANYL CITRATE (PF) 100 MCG/2ML IJ SOLN: 25.0000 ug | INTRAMUSCULAR | Status: DC | PRN

## 2020-01-22 MED ORDER — HYDROMORPHONE HCL 1 MG/ML IJ SOLN: 0.5000 mg | INTRAMUSCULAR | Status: DC | PRN

## 2020-01-22 MED ORDER — VANCOMYCIN HCL 750 MG/150ML IV SOLN
750.0000 mg | INTRAVENOUS | Status: DC
  Administered 2020-01-23 – 2020-01-24 (×2): 750 mg via INTRAVENOUS
  Filled 2020-01-22 (×2): qty 150

## 2020-01-22 MED ORDER — CHLORHEXIDINE GLUCONATE 0.12 % MT SOLN: 15.0000 mL | Freq: Once | OROMUCOSAL | Status: AC

## 2020-01-22 MED ORDER — MAGNESIUM SULFATE 2 GM/50ML IV SOLN: 2.0000 g | Freq: Every day | INTRAVENOUS | Status: DC | PRN

## 2020-01-22 MED ORDER — ALUM & MAG HYDROXIDE-SIMETH 200-200-20 MG/5ML PO SUSP: 15.0000 mL | ORAL | Status: DC | PRN

## 2020-01-22 MED ORDER — OXYCODONE-ACETAMINOPHEN 5-325 MG PO TABS
1.0000 | ORAL_TABLET | ORAL | 0 refills | Status: DC | PRN
Start: 2020-01-22 — End: 2020-01-24

## 2020-01-22 MED ORDER — ACETAMINOPHEN 160 MG/5ML PO SOLN: 1000.0000 mg | Freq: Once | ORAL | Status: DC | PRN

## 2020-01-22 MED ORDER — EPHEDRINE SULFATE-NACL 50-0.9 MG/10ML-% IV SOSY
PREFILLED_SYRINGE | INTRAVENOUS | Status: DC | PRN
  Administered 2020-01-22 (×2): 15 mg via INTRAVENOUS

## 2020-01-22 MED ORDER — 0.9 % SODIUM CHLORIDE (POUR BTL) OPTIME
TOPICAL | Status: DC | PRN
  Administered 2020-01-22: 1000 mL

## 2020-01-22 MED ORDER — ONDANSETRON HCL 4 MG/2ML IJ SOLN
INTRAMUSCULAR | Status: DC | PRN
  Administered 2020-01-22: 4 mg via INTRAVENOUS

## 2020-01-22 MED ORDER — PANTOPRAZOLE SODIUM 40 MG PO TBEC
40.0000 mg | DELAYED_RELEASE_TABLET | Freq: Every day | ORAL | Status: DC
  Administered 2020-01-22 – 2020-01-24 (×3): 40 mg via ORAL
  Filled 2020-01-22 (×3): qty 1

## 2020-01-22 MED ORDER — ACETAMINOPHEN 500 MG PO TABS: 1000.0000 mg | ORAL_TABLET | Freq: Once | ORAL | Status: DC | PRN

## 2020-01-22 MED ORDER — METOPROLOL TARTRATE 5 MG/5ML IV SOLN: 2.0000 mg | INTRAVENOUS | Status: DC | PRN

## 2020-01-22 MED ORDER — SODIUM CHLORIDE 0.9 % IV SOLN: 500.0000 mL | Freq: Once | INTRAVENOUS | Status: DC | PRN

## 2020-01-22 MED ORDER — APIXABAN 5 MG PO TABS
5.0000 mg | ORAL_TABLET | Freq: Two times a day (BID) | ORAL | Status: DC
  Administered 2020-01-23 – 2020-01-24 (×3): 5 mg via ORAL
  Filled 2020-01-22 (×3): qty 1

## 2020-01-22 MED ORDER — DEXAMETHASONE SODIUM PHOSPHATE 10 MG/ML IJ SOLN
INTRAMUSCULAR | Status: AC
  Filled 2020-01-22: qty 1

## 2020-01-22 MED ORDER — LACTATED RINGERS IV SOLN: INTRAVENOUS | Status: DC

## 2020-01-22 MED ORDER — GUAIFENESIN-DM 100-10 MG/5ML PO SYRP: 15.0000 mL | ORAL_SOLUTION | ORAL | Status: DC | PRN

## 2020-01-22 MED ORDER — OXYCODONE HCL 5 MG/5ML PO SOLN: 5.0000 mg | Freq: Once | ORAL | Status: AC | PRN

## 2020-01-22 MED ORDER — OXYCODONE HCL 5 MG PO TABS: 5.0000 mg | ORAL_TABLET | Freq: Once | ORAL | Status: AC | PRN

## 2020-01-22 MED ORDER — CHLORHEXIDINE GLUCONATE 0.12 % MT SOLN
OROMUCOSAL | Status: AC
  Administered 2020-01-22: 15 mL via OROMUCOSAL
  Filled 2020-01-22: qty 15

## 2020-01-22 MED ORDER — ACETAMINOPHEN 650 MG RE SUPP: 325.0000 mg | RECTAL | Status: DC | PRN

## 2020-01-22 MED ORDER — LEVOTHYROXINE SODIUM 112 MCG PO TABS
112.0000 ug | ORAL_TABLET | Freq: Every day | ORAL | Status: DC
  Administered 2020-01-22 – 2020-01-24 (×3): 112 ug via ORAL
  Filled 2020-01-22 (×3): qty 1

## 2020-01-22 MED ORDER — PROPOFOL 10 MG/ML IV BOLUS
INTRAVENOUS | Status: AC
  Filled 2020-01-22: qty 20

## 2020-01-22 MED ORDER — ALBUTEROL SULFATE HFA 108 (90 BASE) MCG/ACT IN AERS: 2.0000 | INHALATION_SPRAY | Freq: Four times a day (QID) | RESPIRATORY_TRACT | Status: DC | PRN

## 2020-01-22 MED ORDER — HYDRALAZINE HCL 20 MG/ML IJ SOLN
5.0000 mg | INTRAMUSCULAR | Status: DC | PRN
  Administered 2020-01-23: 5 mg via INTRAVENOUS
  Filled 2020-01-22 (×2): qty 1

## 2020-01-22 MED ORDER — PHENOL 1.4 % MT LIQD: 1.0000 | OROMUCOSAL | Status: DC | PRN

## 2020-01-22 MED ORDER — OXYCODONE HCL 5 MG/5ML PO SOLN
ORAL | Status: AC
  Administered 2020-01-22: 5 mg via ORAL
  Filled 2020-01-22: qty 5

## 2020-01-22 MED ORDER — LIDOCAINE HCL (PF) 2 % IJ SOLN
INTRAMUSCULAR | Status: AC
  Filled 2020-01-22: qty 5

## 2020-01-22 MED ORDER — DOCUSATE SODIUM 100 MG PO CAPS
100.0000 mg | ORAL_CAPSULE | Freq: Every day | ORAL | Status: DC
  Administered 2020-01-23 – 2020-01-24 (×2): 100 mg via ORAL
  Filled 2020-01-22 (×2): qty 1

## 2020-01-22 MED ORDER — STERILE WATER FOR IRRIGATION IR SOLN
Status: DC | PRN
  Administered 2020-01-22: 1000 mL

## 2020-01-22 MED ORDER — PROPOFOL 10 MG/ML IV BOLUS
INTRAVENOUS | Status: DC | PRN
  Administered 2020-01-22: 70 mg via INTRAVENOUS
  Administered 2020-01-22: 60 mg via INTRAVENOUS

## 2020-01-22 MED ORDER — DEXAMETHASONE SODIUM PHOSPHATE 10 MG/ML IJ SOLN
INTRAMUSCULAR | Status: DC | PRN
  Administered 2020-01-22: 4 mg via INTRAVENOUS

## 2020-01-22 MED ORDER — VANCOMYCIN HCL IN DEXTROSE 1-5 GM/200ML-% IV SOLN
1000.0000 mg | INTRAVENOUS | Status: AC
  Administered 2020-01-22: 1000 mg via INTRAVENOUS
  Filled 2020-01-22: qty 200

## 2020-01-22 MED ORDER — PHENYLEPHRINE HCL-NACL 10-0.9 MG/250ML-% IV SOLN
INTRAVENOUS | Status: DC | PRN
  Administered 2020-01-22: 50 ug/min via INTRAVENOUS

## 2020-01-22 MED ORDER — LABETALOL HCL 5 MG/ML IV SOLN: 10.0000 mg | INTRAVENOUS | Status: DC | PRN

## 2020-01-22 MED ORDER — FENTANYL CITRATE (PF) 250 MCG/5ML IJ SOLN
INTRAMUSCULAR | Status: AC
  Filled 2020-01-22: qty 5

## 2020-01-22 MED ORDER — ALBUTEROL SULFATE (2.5 MG/3ML) 0.083% IN NEBU: 2.5000 mg | INHALATION_SOLUTION | Freq: Four times a day (QID) | RESPIRATORY_TRACT | Status: DC | PRN

## 2020-01-22 MED ORDER — ACETAMINOPHEN 10 MG/ML IV SOLN
INTRAVENOUS | Status: AC
  Administered 2020-01-22: 1000 mg via INTRAVENOUS
  Filled 2020-01-22: qty 100

## 2020-01-22 MED ORDER — CITALOPRAM HYDROBROMIDE 20 MG PO TABS
20.0000 mg | ORAL_TABLET | Freq: Every day | ORAL | Status: DC
  Administered 2020-01-23 – 2020-01-24 (×2): 20 mg via ORAL
  Filled 2020-01-22 (×2): qty 1

## 2020-01-22 MED ORDER — ORAL CARE MOUTH RINSE: 15.0000 mL | Freq: Once | OROMUCOSAL | Status: AC

## 2020-01-22 SURGICAL SUPPLY — 40 items
CANISTER SUCT 3000ML PPV (MISCELLANEOUS) ×3 IMPLANT
CLIP VESOCCLUDE MED 6/CT (CLIP) IMPLANT
CLIP VESOCCLUDE SM WIDE 6/CT (CLIP) IMPLANT
COVER SURGICAL LIGHT HANDLE (MISCELLANEOUS) ×3 IMPLANT
COVER WAND RF STERILE (DRAPES) IMPLANT
DRAPE HALF SHEET 40X57 (DRAPES) IMPLANT
DRAPE U-SHAPE 76X120 STRL (DRAPES) IMPLANT
ELECT CAUTERY BLADE 6.4 (BLADE) ×3 IMPLANT
ELECT REM PT RETURN 9FT ADLT (ELECTROSURGICAL) ×3
ELECTRODE REM PT RTRN 9FT ADLT (ELECTROSURGICAL) ×1 IMPLANT
GAUZE SPONGE 4X4 12PLY STRL (GAUZE/BANDAGES/DRESSINGS) IMPLANT
GAUZE SPONGE 4X4 12PLY STRL LF (GAUZE/BANDAGES/DRESSINGS) ×3 IMPLANT
GAUZE XEROFORM 5X9 LF (GAUZE/BANDAGES/DRESSINGS) IMPLANT
GLOVE SS BIOGEL STRL SZ 7.5 (GLOVE) ×1 IMPLANT
GLOVE SUPERSENSE BIOGEL SZ 7.5 (GLOVE) ×2
GOWN STRL REUS W/ TWL LRG LVL3 (GOWN DISPOSABLE) ×2 IMPLANT
GOWN STRL REUS W/ TWL XL LVL3 (GOWN DISPOSABLE) IMPLANT
GOWN STRL REUS W/TWL LRG LVL3 (GOWN DISPOSABLE) ×6
GOWN STRL REUS W/TWL XL LVL3 (GOWN DISPOSABLE)
HANDPIECE INTERPULSE COAX TIP (DISPOSABLE)
IV NS IRRIG 3000ML ARTHROMATIC (IV SOLUTION) IMPLANT
KIT BASIN OR (CUSTOM PROCEDURE TRAY) ×3 IMPLANT
KIT TURNOVER KIT B (KITS) ×3 IMPLANT
NS IRRIG 1000ML POUR BTL (IV SOLUTION) ×3 IMPLANT
PACK CV ACCESS (CUSTOM PROCEDURE TRAY) IMPLANT
PACK GENERAL/GYN (CUSTOM PROCEDURE TRAY) ×3 IMPLANT
PACK UNIVERSAL I (CUSTOM PROCEDURE TRAY) ×3 IMPLANT
PAD ARMBOARD 7.5X6 YLW CONV (MISCELLANEOUS) ×6 IMPLANT
PULSAVAC PLUS IRRIG FAN TIP (DISPOSABLE)
SET HNDPC FAN SPRY TIP SCT (DISPOSABLE) IMPLANT
SUT ETHILON 3 0 PS 1 (SUTURE) ×6 IMPLANT
SUT VIC AB 2-0 CTX 36 (SUTURE) IMPLANT
SUT VIC AB 3-0 SH 27 (SUTURE)
SUT VIC AB 3-0 SH 27X BRD (SUTURE) IMPLANT
SUT VICRYL 4-0 PS2 18IN ABS (SUTURE) IMPLANT
SWAB COLLECTION DEVICE MRSA (MISCELLANEOUS) ×6 IMPLANT
TAPE CLOTH SURG 4X10 WHT LF (GAUZE/BANDAGES/DRESSINGS) ×3 IMPLANT
TIP FAN IRRIG PULSAVAC PLUS (DISPOSABLE) IMPLANT
TOWEL GREEN STERILE (TOWEL DISPOSABLE) ×3 IMPLANT
WATER STERILE IRR 1000ML POUR (IV SOLUTION) ×3 IMPLANT

## 2020-01-22 NOTE — Transfer of Care (Signed)
Immediate Anesthesia Transfer of Care Note  Patient: [name]  Procedure(s) Performed: DEBRIDEMENT LEFT GROIN WOUND (Left )  Patient Location: PACU  Anesthesia Type:General  Level of Consciousness: awake, alert  and oriented  Airway & Oxygen Therapy: Patient Spontanous Breathing  Post-op Assessment: Report given to RN  Post vital signs: Reviewed and stable  Last Vitals:  Vitals Value Taken Time  BP    Temp    Pulse 76 01/22/20 1140  Resp 13 01/22/20 1140  SpO2 94 % 01/22/20 1140  Vitals shown include unvalidated device data.  Last Pain:  Vitals:   01/22/20 0830  TempSrc:   PainSc: 0-No pain      Patients Stated Pain Goal: 3 (37/34/28 7681)  Complications: No complications documented.

## 2020-01-22 NOTE — Progress Notes (Signed)
Pt arrived from vascular lab to 4E room 20 after left groin I & D.  Site is soft, clean, dry and dressed with gauze and tape.  Telemetry monitor applied and CCMD notified.  Patient oriented to unit and room to include call light and phone.  Will continue to monitor.

## 2020-01-22 NOTE — Anesthesia Preprocedure Evaluation (Addendum)
Anesthesia Evaluation  Patient identified by MRN, date of birth, ID band Patient awake    Reviewed: Allergy & Precautions, NPO status , Patient's Chart, lab work & pertinent test results  History of Anesthesia Complications Negative for: history of anesthetic complications  Airway Mallampati: III  TM Distance: >3 FB Neck ROM: Full    Dental  (+) Dental Advisory Given, Edentulous Upper, Edentulous Lower   Pulmonary Current SmokerPatient did not abstain from smoking.,    + rhonchi        Cardiovascular hypertension, Pt. on medications and Pt. on home beta blockers + Past MI and + Peripheral Vascular Disease  + dysrhythmias Atrial Fibrillation  Rhythm:Regular     Neuro/Psych PSYCHIATRIC DISORDERS Anxiety Depression Left bells CVA, Residual Symptoms    GI/Hepatic negative GI ROS, (+) Hepatitis -  Endo/Other  negative endocrine ROS  Renal/GU CRFRenal diseaseLab Results      Component                Value               Date                      CREATININE               1.08 (H)            01/22/2020                Musculoskeletal negative musculoskeletal ROS (+)   Abdominal   Peds  Hematology  (+) Blood dyscrasia, anemia , Lab Results      Component                Value               Date                      WBC                      7.4                 10/12/2015                HGB                      11.5 (L)            10/12/2015                HCT                      37.3                10/12/2015                MCV                      90.1                10/12/2015                PLT                      214                 10/12/2015              Anesthesia  Other Findings   Reproductive/Obstetrics                            Anesthesia Physical Anesthesia Plan  ASA: III  Anesthesia Plan: General   Post-op Pain Management:    Induction: Intravenous  PONV Risk Score and Plan:  2 and Ondansetron and Dexamethasone  Airway Management Planned: LMA  Additional Equipment: None  Intra-op Plan:   Post-operative Plan: Extubation in OR  Informed Consent: I have reviewed the patients History and Physical, chart, labs and discussed the procedure including the risks, benefits and alternatives for the proposed anesthesia with the patient or authorized representative who has indicated his/her understanding and acceptance.     Dental advisory given  Plan Discussed with: CRNA and Surgeon  Anesthesia Plan Comments:         Anesthesia Quick Evaluation

## 2020-01-22 NOTE — Anesthesia Procedure Notes (Signed)
Procedure Name: LMA Insertion Date/Time: 01/22/2020 10:56 AM Performed by: Barrington Ellison, CRNA Pre-anesthesia Checklist: Patient identified, Emergency Drugs available, Suction available and Patient being monitored Patient Re-evaluated:Patient Re-evaluated prior to induction Oxygen Delivery Method: Circle System Utilized Preoxygenation: Pre-oxygenation with 100% oxygen Induction Type: IV induction Ventilation: Mask ventilation without difficulty LMA: LMA inserted LMA Size: 4.0 Number of attempts: 1 Placement Confirmation: positive ETCO2 Tube secured with: Tape Dental Injury: Teeth and Oropharynx as per pre-operative assessment

## 2020-01-22 NOTE — Consult Note (Signed)
[facility] for Infectious Disease    Date of Admission:  01/22/2020      Total days of antibiotics 1  Vancomycin 12/01 >>              Reason for Consult: Vascular Graft Infection     Referring Provider: Early  Primary Care Provider: Jolinda Croak, MD    Assessment: [name] is a pleasant 74 y.o. female who was referred in mid November to Dr. Donnetta Hutching for evaluation of a wound that spontaneously developed 3-4 months ago overlying previous femoral bypass graft. 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 No systemic signs of illness, fortunately. She went for debridement today. In discussion with Dr. Donnetta Hutching it appeared that the infection did track down to the graft. 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 Recommended admission for IV antibiotics and to coordinate care for home administration. Will follow cultures for adjustments. Graft is retained. Unable to consider rifampin for her with Eliquis.   PCN allergy history. Unclear whether she has ever had cephalosporins in the past. Cannot find record of that in the chart. Will discuss with her further in AM about cephalosporin trial if fitting based on micro.     Plan: 1. Continue IV vancomycin  2. Plan for PICC line in AM - discussed with her that she will need home IV antibiotics.  3. Follow micro data    Active Problems:   Surgical wound infection   Vascular graft infection (HCC)   S/P aortobifemoral bypass surgery     HPI: [name] is a 74 y.o. 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 Donnetta Hutching.   Zori tells me that she first noticed some erythema and swelling 3-4 months ago. This spontaneously drained one night and continue to have intermittent drainage since. She did not think much of it since the area improved after draining but never resolved. She went to her PCP and referred back to Dr.  Donnetta Hutching to evaluate with h/o aortobifemoral bypass in 2006. She does not recall having any fevers or chills. No malaise. Has been home with her husband of 97 years caring for him (he has cancer).   She says that she has not had any antibiotics to treat this area. Forgot she was allergic to penicillin but reports later it "causes swelling." Cannot recall if she has ever received any cephalexin or other cephalosporins in the past. She has a history of AFib and is on chronic anticoagulation with Eliquis. Also with a history of thyroid disease, coronary artery occlusion, CVA, rheumatic fever, MI. Active cigarette smoker 1/4 pack every day x 65 years.   History of hepatitis without details in chart.    Review of Systems: Review of Systems  Constitutional: Negative for chills, fever, malaise/fatigue and weight loss.  Respiratory: Negative for cough and shortness of breath.   Cardiovascular: Negative for chest pain and leg swelling.  Gastrointestinal: Negative for abdominal pain, diarrhea and vomiting.  Genitourinary: Negative for dysuria.  Skin: Rash: open draining area overlying left groin.  Neurological: Negative for dizziness, focal weakness and weakness.    Past Medical History:  Diagnosis Date  . Anxiety    denies  . Carotid artery occlusion 05/08/2007  . 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 Depression   . Hepatitis   . Hyperlipidemia   .  IBS (irritable bowel syndrome)   . Myocardial infarction ([place])   . Peripheral vascular disease ([place])    had Carotid En  . Rheumatic fever   . Stroke [facility])    no residual- except left side of face has a small "twist"  . Thyroid disease    Hypothyroidism    Social History   Tobacco Use  . Smoking status: Current Every Day Smoker    Packs/day: 0.33    Years: 63.00    Pack years: 20.79    Types: Cigarettes  . Smokeless tobacco: Never Used  . Tobacco comment: 1 pack every 3 days x 27yrs  Vaping Use  . Vaping Use:  Never used  Substance Use Topics  . Alcohol use: No  . Drug use: No    Family History  Problem Relation Age of Onset  . Cancer Sister   . Hypertension Father   . Heart attack Mother   . Hyperlipidemia Mother   . Kidney disease Brother   . Cancer Brother   . Cancer Brother        Lung   . Colon cancer Neg Hx    Allergies  Allergen Reactions  . Penicillins Swelling  . Citalopram Other (See Comments)    nightmares  . Morphine And Related Nausea And Vomiting  . Losartan Nausea Only    Nausea and dizzy    OBJECTIVE: Blood pressure (!) 166/73, pulse (!) 55, temperature 97.8 F (36.6 C), temperature source Oral, resp. rate 14, height 4\' 11"  (1.499 m), weight 50.8 kg, SpO2 93 %.  Physical Exam Constitutional:      Comments: Resting comfortably on stretcher in PACU.   HENT:     Mouth/Throat:     Mouth: Mucous membranes are moist.     Pharynx: No oropharyngeal exudate.  Eyes:     General: No scleral icterus.    Pupils: Pupils are equal, round, and reactive to light.  Cardiovascular:     Rate and Rhythm: Normal rate and regular rhythm.     Heart sounds: No murmur heard.   Pulmonary:     Effort: Pulmonary effort is normal.     Breath sounds: Rhonchi present.  Abdominal:     General: There is no distension.     Palpations: Abdomen is soft.     Tenderness: There is no abdominal tenderness.  Skin:    General: Skin is warm and dry.     Capillary Refill: Capillary refill takes less than 2 seconds.     Comments: Groin with freshly applied dressing  Neurological:     Mental Status: She is alert and oriented to person, place, and time.     Lab Results Lab Results  Component Value Date   WBC 8.0 01/22/2020   HGB 10.5 (L) 01/22/2020   HCT 33.9 (L) 01/22/2020   MCV 87.4 01/22/2020   PLT 237 01/22/2020    Lab Results  Component Value Date   CREATININE 0.94 01/22/2020   BUN 21 01/22/2020   NA 139 01/22/2020   K 4.2 01/22/2020   CL 99 01/22/2020   CO2 28 01/22/2020     Lab Results  Component Value Date   ALT 14 01/22/2020   AST 24 01/22/2020   ALKPHOS 58 01/22/2020   BILITOT 0.5 01/22/2020     Microbiology: Recent Results (from the past 240 hour(s))  SARS CORONAVIRUS 2 (TAT 6-24 HRS) Nasopharyngeal Nasopharyngeal Swab     Status: None   Collection Time: 01/21/20 10:43 AM  Specimen: Nasopharyngeal Swab  Result Value Ref Range Status   SARS Coronavirus 2 NEGATIVE NEGATIVE Final    Comment: (NOTE) SARS-CoV-2 target nucleic acids are NOT DETECTED.  The SARS-CoV-2 RNA is generally detectable in upper and lower respiratory specimens during the acute phase of infection. Negative results do not preclude SARS-CoV-2 infection, do not rule out co-infections with other pathogens, and should not be used as the sole basis for treatment or other patient management decisions. Negative results must be combined with clinical observations, patient history, and epidemiological information. The expected result is Negative.  Fact Sheet for Patients: SugarRoll.be  Fact Sheet for Healthcare Providers: https://www.woods-mathews.com/  This test is not yet approved or cleared by the [place] FDA and  has been authorized for detection and/or diagnosis of SARS-CoV-2 by FDA under an Emergency Use Authorization (EUA). This EUA will remain  in effect (meaning this test can be used) for the duration of the COVID-19 declaration under Se ction 564(b)(1) of the Act, 21 U.S.C. section 360bbb-3(b)(1), unless the authorization is terminated or revoked sooner.  Performed at [facility], [place] [address], [place], [place] [address]   Aerobic/Anaerobic Culture (surgical/deep wound)     Status: None (Preliminary result)   Collection Time: 01/22/20 11:17 AM   Specimen: Wound  Result Value Ref Range Status   Specimen Description WOUND  Final   Special Requests LEFT GROIN SPEC A  Final   Gram Stain   Final    RARE WBC  PRESENT, PREDOMINANTLY PMN RARE GRAM POSITIVE COCCI IN PAIRS CRITICAL RESULT CALLED TO, READ BACK BY AND VERIFIED WITH: RN Camila Li [id number] AT 1201 BY CM Performed at [facility], [place] [address] [place], [place] [address]    Culture PENDING  Incomplete   Report Status PENDING  Incomplete  Aerobic/Anaerobic Culture (surgical/deep wound)     Status: None (Preliminary result)   Collection Time: 01/22/20 11:23 AM   Specimen: Wound; Tissue  Result Value Ref Range Status   Specimen Description TISSUE  Final   Special Requests LEFT GROIN SINUS TRACT SPEC B  Final   Gram Stain   Final    ABUNDANT WBC PRESENT,BOTH PMN AND MONONUCLEAR NO ORGANISMS SEEN Performed at [facility], [address], [place], [place] [address]    Culture PENDING  Incomplete   Report Status PENDING  Incomplete  Aerobic/Anaerobic Culture (surgical/deep wound)     Status: None (Preliminary result)   Collection Time: 01/22/20 11:24 AM   Specimen: Wound  Result Value Ref Range Status   Specimen Description WOUND  Final   Special Requests LEFT GROIN SPEC C  Final   Gram Stain   Final    FEW WBC PRESENT,BOTH PMN AND MONONUCLEAR NO ORGANISMS SEEN Performed at [facility], [address] [place], [place] [address]    Culture PENDING  Incomplete   Report Status PENDING  Incomplete    Janene Madeira, MSN, NP-C [facility] for [email or web address] Pager: [phone] Office: [place]: [phone]

## 2020-01-22 NOTE — Interval H&P Note (Signed)
History and Physical Interval Note:  01/22/2020 10:34 AM  [name]  has presented today for surgery, with the diagnosis of LEFT GROIN WOUND.  The various methods of treatment have been discussed with the patient and family. After consideration of risks, benefits and other options for treatment, the patient has consented to  Procedure(s): DEBRIDEMENT LEFT GROIN WOUND (Left) as a surgical intervention.  The patient's history has been reviewed, patient examined, no change in status, stable for surgery.  I have reviewed the patient's chart and labs.  Questions were answered to the patient's satisfaction.     Curt Jews

## 2020-01-22 NOTE — Discharge Instructions (Signed)
[facility]  Discharge instructions  Lower Extremity Surgery  Please refer to the following instruction for your post-procedure care. Your surgeon or physician assistant will discuss any changes with you.  Activity  You are encouraged to walk as much as you can. You can slowly return to normal activities during the month after your surgery. Avoid strenuous activity and heavy lifting until your doctor tells you it's OK. Avoid activities such as vacuuming or swinging a golf club. Do not drive until your doctor give the OK and you are no longer taking prescription pain medications. It is also normal to have difficulty with sleep habits, eating and bowel movement after surgery. These will go away with time.  Bathing/Showering  Shower daily after you go home. Do not soak in a bathtub, hot tub, or swim until the incision heals completely.  Incision Care  Clean your incision with mild soap and water. Shower every day. Pat the area dry with a clean towel. You do not need a bandage unless otherwise instructed. Do not apply any ointments or creams to your incision. If you have open wounds you will be instructed how to care for them or a visiting nurse may be arranged for you. If you have staples or sutures along your incision they will be removed at your post-op appointment. You may have skin glue on your incision. Do not peel it off. It will come off on its own in about one week.  Wash the groin wound with soap and water daily and pat dry. (No tub bath-only shower). Do this daily and as needed.  Do not use Vaseline or neosporin on your incisions.  Only use soap and water on your incisions and then protect and keep dry.  Diet  Resume your normal diet. There are no special food restrictions following this procedure. A low fat/ low cholesterol diet is recommended for all patients with vascular disease. In order to heal from your surgery, it is CRITICAL to get adequate  nutrition. Your body requires vitamins, minerals, and protein. Vegetables are the best source of vitamins and minerals. Vegetables also provide the perfect balance of protein. Processed food has little nutritional value, so try to avoid this.  Medications  Resume taking all your medications unless your doctor or physician assistant tells you not to. If your incision is causing pain, you may take over-the-counter pain relievers such as acetaminophen (Tylenol). If you were prescribed a stronger pain medication, please aware these medication can cause nausea and constipation. Prevent nausea by taking the medication with a snack or meal. Avoid constipation by drinking plenty of fluids and eating foods with high amount of fiber, such as fruits, vegetables, and grains. Take Colace 100 mg (an over-the-counter stool softener) twice a day as needed for constipation.   Do not take Tylenol if you are taking prescription pain medications.  Follow Up  Our office will schedule a follow up appointment 2-3 weeks following discharge.  Please call us immediately for any of the following conditions  .Severe or worsening pain in your legs or feet while at rest or while walking .Increase pain, redness, warmth, or drainage (pus) from your incision site(s) . Fever of 101 degree or higher . The swelling in your leg with the bypass suddenly worsens and becomes more painful than when you were in the hospital . If you have been instructed to feel your graft pulse then you should do so every day. If you can no longer  feel this pulse, call the office immediately. Not all patients are given this instruction. .  Leg swelling is common after leg bypass surgery.  The swelling should improve over a few months following surgery. To improve the swelling, you may elevate your legs above the level of your heart while you are sitting or resting. Your surgeon or physician assistant may ask you to apply an ACE wrap or wear compression  (TED) stockings to help to reduce swelling.  Reduce your risk of vascular disease  Stop smoking. If you would like help call [facility] at 1-800-QUIT-NOW 2691565703) or [place] at (430)617-3858.  . Manage your cholesterol . Maintain a desired weight . Control your diabetes weight . Control your diabetes . Keep your blood pressure down .  If you have any questions, please call the office at [phone]

## 2020-01-22 NOTE — OR Nursing (Signed)
Call from lab regarding results on patient, Dr. Donnetta Hutching notified of results at bedside

## 2020-01-22 NOTE — Op Note (Signed)
    OPERATIVE REPORT  DATE OF SURGERY: 01/22/2020  PATIENT: [name], 74 y.o. female MRN: [id number]  DOB: [birth year]  PRE-OPERATIVE DIAGNOSIS: Drainage from left groin  POST-OPERATIVE DIAGNOSIS:  Same  PROCEDURE: Debridement and excision of sinus tract left groin  SURGEON:  Curt Jews, M.D.  PHYSICIAN ASSISTANT: Nurse  The assistant was needed for exposure and to expedite the case  ANESTHESIA: LMA  EBL: per anesthesia record  No intake/output data recorded.  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  She had presented to our office with drainage from her left groin.  This had been present for several months.  She reports that the swelling in her left groin would sometimes spontaneously increase and decrease.  There was a sinus tract present and I recommended that she may undergo open debridement.  Explained that this may track down to her graft.  She has no symptoms of sepsis.  Patient was taken up and placed supine position where the area the left groin prepped draped in sterile fashion.  An ellipse was made around the sinus tract which was approximately a centimeter medial to her old groin incision.  There was some purulent drainage from the sinus tract itself prior to incision and this was sent for Gram stain's culture and sensitivity.  The sinus tract itself was excised down further and did communicate to some fluid around the Dacron graft material at the base of the incision.  This was all debrided.  The wound was irrigated with saline.  There was no evidence of anastomotic disruption and the anastomosis was not seen.  The wound was closed with several 3-0 nylon mattress sutures to reapproximate the subcutaneous tissue and the skin.  Sterile dressing was applied and the patient was  transferred to the recovery room in stable condition   Rosetta Posner, M.D., [facility] 01/22/2020 11:38 AM

## 2020-01-22 NOTE — OR Nursing (Signed)
Pt is awake,alert and oriented.Pt and/or family verbalized understanding of poc and discharge instructions. Reviewed admission and on going care with receiving RN. Pt is in NAD at this time and is ready to be transferred to floor. Will con't to monitor until pt is transferred. Belongings on bed with patient Pt on 02 2l,min [place] Pt on Monitor

## 2020-01-22 NOTE — Progress Notes (Signed)
Pharmacy Antibiotic Note  [name] is a 74 y.o. female admitted on 01/22/2020 with possible graft infection with drainage noted from left groin s/p debridement and excision.  Pharmacy has been consulted for Vancomycin dosing.   SCr 1.08 >> 0.94. WBC is within normal limits.  Pre-operative Vancomycin given at 01/22/20 at 09:23 AM.    Plan: Continue Vancomycin 750 mg IV every 24 hours.  Monitor renal function, clinical status, and any culture results.   Height: 4\' 11"  (149.9 cm) Weight: 50.8 kg (111 lb 15.9 oz) IBW/kg (Calculated) : 43.2  Temp (24hrs), Avg:97.8 F (36.6 C), Min:97.7 F (36.5 C), Max:98 F (36.7 C)  Recent Labs  Lab 01/22/20 0815 01/22/20 1342  WBC  --  8.0  CREATININE 1.08*  --     Estimated Creatinine Clearance: 30.2 mL/min (A) (by C-G formula based on SCr of 1.08 mg/dL (H)).    Allergies  Allergen Reactions  . Penicillins Swelling  . Citalopram Other (See Comments)    nightmares  . Morphine And Related Nausea And Vomiting  . Losartan Nausea Only    Nausea and dizzy    Antimicrobials this admission: Vancomycin 12/1 >>  Dose adjustments this admission:  Microbiology results: 12/1 Left groin wound >>   Thank you for allowing pharmacy to be a part of this patient's care.  Sloan Leiter, PharmD, BCPS, BCCCP Clinical Pharmacist Please refer to [facility] for [place] numbers 01/22/2020 3:08 PM

## 2020-01-23 ENCOUNTER — Inpatient Hospital Stay: Payer: Self-pay

## 2020-01-23 ENCOUNTER — Encounter (HOSPITAL_COMMUNITY): Payer: Self-pay | Admitting: Vascular Surgery

## 2020-01-23 LAB — BASIC METABOLIC PANEL
Anion gap: 13 (ref 5–15)
BUN: 20 mg/dL (ref 8–23)
CO2: 28 mmol/L (ref 22–32)
Calcium: 9.2 mg/dL (ref 8.9–10.3)
Chloride: 96 mmol/L — ABNORMAL LOW (ref 98–111)
Creatinine, Ser: 0.91 mg/dL (ref 0.44–1.00)
GFR, Estimated: >60 mL/min (ref >60)
Glucose, Bld: 146 mg/dL — ABNORMAL HIGH (ref 70–99)
Potassium: 4.3 mmol/L (ref 3.5–5.1)
Sodium: 137 mmol/L (ref 135–145)

## 2020-01-23 LAB — CBC
HCT: 31.8 % — ABNORMAL LOW (ref 36.0–46.0)
Hemoglobin: 10.1 g/dL — ABNORMAL LOW (ref 12.0–15.0)
MCH: 27.3 pg (ref 26.0–34.0)
MCHC: 31.8 g/dL (ref 30.0–36.0)
MCV: 85.9 fL (ref 80.0–100.0)
Platelets: 233 10*3/uL (ref 150–400)
RBC: 3.7 MIL/uL — ABNORMAL LOW (ref 3.87–5.11)
RDW: 13.7 % (ref 11.5–15.5)
WBC: 8.1 10*3/uL (ref 4.0–10.5)
nRBC: 0 % (ref 0.0–0.2)

## 2020-01-23 LAB — HEPATITIS B CORE ANTIBODY, TOTAL: Hep B Core Total Ab: NONREACTIVE

## 2020-01-23 LAB — HEPATITIS B SURFACE ANTIGEN: Hepatitis B Surface Ag: NONREACTIVE

## 2020-01-23 LAB — HEPATITIS C ANTIBODY: HCV Ab: NONREACTIVE

## 2020-01-23 MED ORDER — CHLORHEXIDINE GLUCONATE CLOTH 2 % EX PADS
6.0000 | MEDICATED_PAD | Freq: Every day | CUTANEOUS | Status: DC
  Administered 2020-01-23 – 2020-01-24 (×2): 6 via TOPICAL

## 2020-01-23 MED ORDER — SODIUM CHLORIDE 0.9% FLUSH: 10.0000 mL | INTRAVENOUS | Status: DC | PRN

## 2020-01-23 MED ORDER — SODIUM CHLORIDE 0.9% FLUSH
10.0000 mL | Freq: Two times a day (BID) | INTRAVENOUS | Status: DC
  Administered 2020-01-23 – 2020-01-24 (×3): 10 mL

## 2020-01-23 NOTE — Progress Notes (Signed)
Peripherally Inserted Central Catheter Placement  The IV Nurse has discussed with the patient and/or persons authorized to consent for the patient, the purpose of this procedure and the potential benefits and risks involved with this procedure.  The benefits include less needle sticks, lab draws from the catheter, and the patient may be discharged home with the catheter. Risks include, but not limited to, infection, bleeding, blood clot (thrombus formation), and puncture of an artery; nerve damage and irregular heartbeat and possibility to perform a PICC exchange if needed/ordered by physician.  Alternatives to this procedure were also discussed.  Bard Power PICC patient education guide, fact sheet on infection prevention and patient information card has been provided to patient /or left at bedside.    PICC Placement Documentation  PICC Single Lumen 47/34/03 PICC Right Basilic 33 cm 0 cm (Active)  Indication for Insertion or Continuance of Line Home intravenous therapies (PICC only) 01/23/20 1400  Exposed Catheter (cm) 0 cm 01/23/20 1400  Site Assessment Clean;Dry;Intact 01/23/20 1400  Line Status Flushed;Blood return noted 01/23/20 1400  Dressing Type Transparent 01/23/20 1400  Dressing Status Clean;Dry;Intact 01/23/20 1400  Antimicrobial disc in place? Yes 01/23/20 1400  Dressing Intervention New dressing 01/23/20 1400  Dressing Change Due 01/30/20 01/23/20 1400       Jule Economy Horton 01/23/2020, 2:26 PM

## 2020-01-23 NOTE — TOC Initial Note (Signed)
Transition of Care (TOC) - Initial/Assessment Note  Marvetta Gibbons RN, BSN Transitions of Care Unit 4E- RN Case Manager See Treatment Team for direct phone #    Patient Details  Name: [name] MRN: [id number] Date of Birth: Oct 07, 1945  Transition of Care [facility]) CM/SW Contact:    Dawayne Patricia, RN Phone Number: 01/23/2020, 3:43 PM  Clinical Narrative:                 Noted pt will need home IV abx 6wks- PICC line has been placed- CM spoke with pt at bedside for transition needs. Per pt she will have family that will assist with home abx- she is from home with spouse- ID has contacted [facility], choice offered to pt for home infusion needs- pt states she does not have a preference and is fine with [facility]) and whomever they coordinate with for [facility] needs .  Have spoken with Pam at [facility]- who will f/u with pt/family for bedside education prior to discharge.  Pt also requesting a RW for home- will f/u for DME order and call [facility] for delivery prior to discharge  Expected Discharge Plan: [place] Barriers to Discharge: Continued Medical Work up   Patient Goals and CMS Choice Patient states their goals for this hospitalization and ongoing recovery are:: return home CMS Medicare.gov Compare Post Acute Care list provided to:: Patient Choice offered to / list presented to : Patient  Expected Discharge Plan and Services Expected Discharge Plan: [place]   Discharge Planning Services: CM Consult Post Acute Care Choice: [place] arrangements for the past 2 months: Single Family Home Expected Discharge Date: 01/22/20                         HH Arranged: IV Antibiotics, RN HH Agency: [facility] Date HH Agency Contacted: 01/23/20 Time HH Agency Contacted: 65 Representative spoke with at [place]: [place] Arrangements/Services Living arrangements for the past 2 months: [place] with:: Spouse Patient language and need for interpreter reviewed:: Yes Do you feel safe going back to the place where you live?: Yes      Need for Family Participation in Patient Care: Yes (Comment) Care giver support system in place?: Yes (comment) Current home services: DME Criminal Activity/Legal Involvement Pertinent to Current Situation/Hospitalization: No - Comment as needed  Activities of Daily Living Home Assistive Devices/Equipment: None ADL Screening (condition at time of admission) Patient's cognitive ability adequate to safely complete daily activities?: Yes Is the patient deaf or have difficulty hearing?: No Does the patient have difficulty seeing, even when wearing glasses/contacts?: No Does the patient have difficulty concentrating, remembering, or making decisions?: No Patient able to express need for assistance with ADLs?: Yes Does the patient have difficulty dressing or bathing?: No Independently performs ADLs?: Yes (appropriate for developmental age) Does the patient have difficulty walking or climbing stairs?: No Weakness of Legs: None Weakness of Arms/Hands: None  Permission Sought/Granted Permission sought to share information with : Chartered certified accountant granted to share information with : Yes, Verbal Permission Granted     Permission granted to share info w AGENCY: HH infusion        Emotional Assessment Appearance:: Appears stated age Attitude/Demeanor/Rapport: Engaged Affect (typically observed): Appropriate Orientation: : Oriented to Self, Oriented to  Time, Oriented to Situation, Oriented to Place Alcohol / Substance Use: Not Applicable Psych Involvement:  No (comment)  Admission diagnosis:  Surgical wound infection [T81.49XA] Patient Active Problem List   Diagnosis Date Noted  . Surgical wound infection 01/22/2020  . S/P aortobifemoral bypass surgery 01/22/2020  . 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  Essential hypertension 03/09/2016  . PAF (paroxysmal atrial fibrillation) ([place]) 03/09/2016  . IBS (irritable bowel syndrome) 09/17/2013  . Diarrhea 04/24/2013  . Aftercare following surgery of the circulatory system, [place] 01/30/2013  . Peripheral vascular disease, unspecified ([place]) 01/30/2013  . Pain in limb 01/02/2012  . Occlusion and stenosis of carotid artery without mention of cerebral infarction 06/27/2011   PCP:  Jolinda Croak, MD Pharmacy:   [facility] [place] [address] Phone: [phone] Fax: [phone]     Social Determinants of Health (SDOH) Interventions    Readmission Risk Interventions No flowsheet data found.

## 2020-01-23 NOTE — Anesthesia Postprocedure Evaluation (Signed)
Anesthesia Post Note  Patient: [name]  Procedure(s) Performed: DEBRIDEMENT LEFT GROIN WOUND (Left )     Patient location during evaluation: PACU Anesthesia Type: General Level of consciousness: awake and alert Pain management: pain level controlled Vital Signs Assessment: post-procedure vital signs reviewed and stable Respiratory status: spontaneous breathing, nonlabored ventilation, respiratory function stable and patient connected to nasal cannula oxygen Cardiovascular status: blood pressure returned to baseline and stable Postop Assessment: no apparent nausea or vomiting Anesthetic complications: no   No complications documented.  Last Vitals:  Vitals:   01/23/20 0755 01/23/20 1115  BP: 137/66 138/84  Pulse:  67  Resp: 17 18  Temp: 36.6 C 36.8 C  SpO2: 98% 100%    Last Pain:  Vitals:   01/23/20 1311  TempSrc:   PainSc: 5                  Jecenia Leamer

## 2020-01-23 NOTE — Progress Notes (Signed)
ID Brief Progress Note:   [name] is a 74 y.o. 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 Gram stain reveals gram positive cocci in pairs without any growth early on. Presume streptococcus. Will continue vancomycin for now. PICC line orders in. Planning 6 weeks and pending on growth possible suppression thereafter.   Hep B core Ab and sAg are negative, hep C Ab negative.    Will follow in person tomorrow AM for further coordination of care. Plans for discharge home in 24-48h likely.    Janene Madeira, MSN, NP-C [facility] [email or web address] Pager: [phone] Office: [place]: [phone]

## 2020-01-23 NOTE — Progress Notes (Addendum)
  Progress Note    01/23/2020 8:06 AM 1 Day Post-Op  Subjective:  No major complaints. Mild surgical site tenderness   Vitals:   01/23/20 0346 01/23/20 0755  BP: 134/90 137/66  Pulse: 82   Resp: 17 17  Temp: 98 F (36.7 C) 97.9 F (36.6 C)  SpO2: 95% 98%   Physical Exam: Cardiac:  regular Lungs: non labored Incisions:  Left groin incision is clean, dry and intact. Nylon sutures in place Extremities:  Bilateral lower extremities well perfused and warm with palpable Dp pulses Abdomen:  Soft, non tender, non distended Neurologic: alert and oriented  CBC    Component Value Date/Time   WBC 8.1 01/23/2020 0333   RBC 3.70 (L) 01/23/2020 0333   HGB 10.1 (L) 01/23/2020 0333   HCT 31.8 (L) 01/23/2020 0333   PLT 233 01/23/2020 0333   MCV 85.9 01/23/2020 0333   MCH 27.3 01/23/2020 0333   MCHC 31.8 01/23/2020 0333   RDW 13.7 01/23/2020 0333    BMET    Component Value Date/Time   NA 137 01/23/2020 0333   NA 142 07/03/2019 1116   K 4.3 01/23/2020 0333   CL 96 (L) 01/23/2020 0333   CO2 28 01/23/2020 0333   GLUCOSE 146 (H) 01/23/2020 0333   BUN 20 01/23/2020 0333   BUN 21 07/03/2019 1116   CREATININE 0.91 01/23/2020 0333   CALCIUM 9.2 01/23/2020 0333   GFRNONAA >60 01/23/2020 0333   GFRAA 57 (L) 07/03/2019 1116    INR No results found for: INR   Intake/Output Summary (Last 24 hours) at 01/23/2020 0806 Last data filed at 01/22/2020 1130 Gross per 24 hour  Intake --  Output 10 ml  Net -10 ml     Assessment/Plan:  74 y.o. female is s/p debridement and excision of sinus tract left groin 1 Day Post-Op. Doing well post op. Afebrile. No leukocytosis. Gram stain with GPC in chains. Cultures pending. Appreciate ID recommendations. On IV Vancomycin. Planning PICC line today. Duration of abx per ID. Keep dry gauze in left groin. Left lower extremity well perfused and warm. Anticipate possible discharge tomorrow once OPAT and [facility] RN arranged   DVT prophylaxis:  SCDs   Marval Regal [facility] [phone] 01/23/2020 8:06 AM   I came by to evaluate the patient however she was in a sterile procedure, receiving her PICC line for home antibiotics.  I will try to follow-up later.  Hopefully she can get home tomorrow, pending final recommendations from infectious disease.  Annamarie Major

## 2020-01-24 DIAGNOSIS — T827XXA Infection and inflammatory reaction due to other cardiac and vascular devices, implants and grafts, initial encounter: Secondary | ICD-10-CM | POA: Diagnosis not present

## 2020-01-24 LAB — CK: Total CK: 28 U/L — ABNORMAL LOW (ref 38–234)

## 2020-01-24 LAB — HEPATITIS B SURFACE ANTIBODY, QUANTITATIVE: Hep B S AB Quant (Post): <3.1 m[IU]/mL — ABNORMAL LOW (ref >9.9)

## 2020-01-24 MED ORDER — DAPTOMYCIN IV (FOR PTA / DISCHARGE USE ONLY): 400.0000 mg | INTRAVENOUS | 0 refills | Status: AC

## 2020-01-24 MED ORDER — OXYCODONE-ACETAMINOPHEN 5-325 MG PO TABS
1.0000 | ORAL_TABLET | Freq: Four times a day (QID) | ORAL | 0 refills | Status: DC | PRN
Start: 2020-01-24 — End: 2020-07-30

## 2020-01-24 MED ORDER — SODIUM CHLORIDE 0.9 % IV SOLN
400.0000 mg | Freq: Every day | INTRAVENOUS | Status: DC
  Administered 2020-01-24: 400 mg via INTRAVENOUS
  Filled 2020-01-24 (×2): qty 8

## 2020-01-24 NOTE — Progress Notes (Signed)
PHARMACY CONSULT NOTE FOR:  OUTPATIENT  PARENTERAL ANTIBIOTIC THERAPY (OPAT)  Indication: Vascular graft infection Regimen: Daptomycin 400 mg IV Q 24 hours   End date: 03/04/2020  IV antibiotic discharge orders are pended. To discharging provider:  please sign these orders via discharge navigator,  Select New Orders & click on the button choice - Manage This Unsigned Work.     Thank you for allowing pharmacy to be a part of this patient's care.  Jimmy Footman, PharmD, BCPS, [place] Infectious [facility] Clinical Pharmacist Phone: [phone] 01/24/2020, 11:59 AM

## 2020-01-24 NOTE — Progress Notes (Addendum)
  Progress Note    01/24/2020 7:55 AM 2 Days Post-Op  Subjective:  Ready for discharge home   Vitals:   01/24/20 0334 01/24/20 0719  BP: 126/64 (!) 180/74  Pulse: 64   Resp: 12 20  Temp: 98.4 F (36.9 C) 98.2 F (36.8 C)  SpO2: 92% 97%   Physical Exam: Lungs:  Non labored Incisions:  L groin incision c/d/i Extremities:  Palpable and symmetrical DP pulses Neurologic: A&O  CBC    Component Value Date/Time   WBC 8.1 01/23/2020 0333   RBC 3.70 (L) 01/23/2020 0333   HGB 10.1 (L) 01/23/2020 0333   HCT 31.8 (L) 01/23/2020 0333   PLT 233 01/23/2020 0333   MCV 85.9 01/23/2020 0333   MCH 27.3 01/23/2020 0333   MCHC 31.8 01/23/2020 0333   RDW 13.7 01/23/2020 0333    BMET    Component Value Date/Time   NA 137 01/23/2020 0333   NA 142 07/03/2019 1116   K 4.3 01/23/2020 0333   CL 96 (L) 01/23/2020 0333   CO2 28 01/23/2020 0333   GLUCOSE 146 (H) 01/23/2020 0333   BUN 20 01/23/2020 0333   BUN 21 07/03/2019 1116   CREATININE 0.91 01/23/2020 0333   CALCIUM 9.2 01/23/2020 0333   GFRNONAA >60 01/23/2020 0333   GFRAA 57 (L) 07/03/2019 1116    INR No results found for: INR   Intake/Output Summary (Last 24 hours) at 01/24/2020 0755 Last data filed at 01/24/2020 0344 Gross per 24 hour  Intake 32.12 ml  Output 100 ml  Net -67.88 ml     Assessment/Plan:  74 y.o. female is s/p L groin debridement 2 Days Post-Op   L groin incision healing well; skin edges viable BLE well perfused Plan for d/c home today if [facility] arranged for IV abx and cleared by ID   Dagoberto Ligas, PA-C [facility] [phone] 01/24/2020 7:55 AM   I agree with the above.  I have seen and evaluated the patient.  Her incision in the left groin is intact.  Her PICC line was placed yesterday.  She should be able to go home later today  Annamarie Major

## 2020-01-24 NOTE — Progress Notes (Signed)
[facility] for Infectious Disease  Date of Admission:  01/22/2020           Current antibiotics: Day 3 vancomycin    Reason for visit: Follow up on vascular graft infection   ASSESSMENT:    # Vascular graft infection Her Gram stain shows GPC in pairs, cultures currently negative and reincubated for better growth.  We have concerns regarding vancomycin long term and her tenuous kidney function.  Will plan for daptomycin for OPAT which will provide broad Gram positive coverage and can be adjusted outpatient as needed based on updated cultures.  Baseline CK is 28.  Cannot do rifampin due to DDI in setting of retained graft infection (also no growth of Staph spp)  PLAN:    -- See below re: OPAT orders.   Diagnosis: Vascular graft infection  Culture Result: GPC in pairs on Gram stain, cultures no growth to date.   Allergies  Allergen Reactions  . Penicillins Swelling  . Citalopram Other (See Comments)    nightmares  . Morphine And Related Nausea And Vomiting  . Losartan Nausea Only    Nausea and dizzy    OPAT Orders Discharge antibiotics to be given via PICC line Discharge antibiotics: Per pharmacy protocol   Duration: 6 weeks End Date: 03/04/20  [facility] Care Per Protocol:  Home health RN for IV administration and teaching; PICC line care and labs.    Labs weekly while on IV antibiotics: _x_ CBC with differential __ BMP _x_ CMP __ CRP __ ESR __ Vancomycin trough _x_ CK  x__ Please pull PIC at completion of IV antibiotics __ Please leave PIC in place until doctor has seen patient or been notified  Fax weekly labs to [phone]  Clinic Follow Up Appt: 01/30/20 at 930am with Terri Piedra    MEDICATIONS:    Scheduled Meds: . apixaban  5 mg Oral BID  . carvedilol  6.25 mg Oral BID  . Chlorhexidine Gluconate Cloth  6 each Topical Daily  . citalopram  20 mg Oral Daily  . docusate sodium  100 mg Oral Daily  . levothyroxine  112 mcg Oral QAC  breakfast  . pantoprazole  40 mg Oral Daily  . rosuvastatin  20 mg Oral Daily  . sodium chloride flush  10-40 mL Intracatheter Q12H    Continuous Infusions: . sodium chloride    . DAPTOmycin (CUBICIN)  IV    . magnesium sulfate bolus IVPB      PRN Meds: sodium chloride, acetaminophen **OR** acetaminophen, albuterol, alum & mag hydroxide-simeth, guaiFENesin-dextromethorphan, hydrALAZINE, HYDROmorphone (DILAUDID) injection, labetalol, magnesium sulfate bolus IVPB, metoprolol tartrate, ondansetron, oxyCODONE, phenol, potassium chloride, sodium chloride flush  SUBJECTIVE:   She is eager to get home to her husband.  Post op pain is controlled.  Her incision looks good.  Had PICC placed yesterday without issues.  She denies fever, chills, n/v/d.   Review of Systems: As noted above.  All other systems reviewed and are negative.   OBJECTIVE:   Allergies  Allergen Reactions  . Penicillins Swelling  . Citalopram Other (See Comments)    nightmares  . Morphine And Related Nausea And Vomiting  . Losartan Nausea Only    Nausea and dizzy    Blood pressure (!) 155/72, pulse (!) 57, temperature 98.1 F (36.7 C), temperature source Oral, resp. rate 14, height 4' 11"  (1.499 m), weight 50.8 kg, SpO2 91 %. Body mass index is 22.62 kg/m.  Physical Exam Constitutional:  General: She is not in acute distress.    Appearance: Normal appearance.  HENT:     Head: Normocephalic and atraumatic.  Pulmonary:     Effort: Pulmonary effort is normal. No respiratory distress.     Comments: On nasal cannula.  Skin:    General: Skin is warm and dry.  Neurological:     General: No focal deficit present.     Mental Status: She is alert and oriented to person, place, and time.  Psychiatric:        Mood and Affect: Mood normal.        Behavior: Behavior normal.       Lab Results & Microbiology Lab Results  Component Value Date   WBC 8.1 01/23/2020   HGB 10.1 (L) 01/23/2020   HCT 31.8 (L)  01/23/2020   MCV 85.9 01/23/2020   PLT 233 01/23/2020    Lab Results  Component Value Date   NA 137 01/23/2020   K 4.3 01/23/2020   CO2 28 01/23/2020   GLUCOSE 146 (H) 01/23/2020   BUN 20 01/23/2020   CREATININE 0.91 01/23/2020   CALCIUM 9.2 01/23/2020   GFRNONAA >60 01/23/2020   GFRAA 57 (L) 07/03/2019    Lab Results  Component Value Date   ALT 14 01/22/2020   AST 24 01/22/2020   ALKPHOS 58 01/22/2020   BILITOT 0.5 01/22/2020     I have reviewed the micro and lab results in Epic.  Imaging [place] EKG SITE RITE  Result Date: 01/23/2020 If Site Rite image not attached, placement could not be confirmed due to current cardiac rhythm.       Raynelle [facility] for Infectious Disease [facility] [phone] pager 01/24/2020, 1:32 PM   I have spent a total of  35 minutes with the patient reviewing hospital notes,  test results, labs and examining the patient as well as establishing an assessment and plan.

## 2020-01-24 NOTE — TOC Transition Note (Signed)
Transition of Care (TOC) - CM/SW Discharge Note Marvetta Gibbons RN, BSN Transitions of Care Unit 4E- RN Case Manager See Treatment Team for direct phone #    Patient Details  Name: JANI MORONTA MRN: [id number] Date of Birth: 01-Oct-1945  Transition of Care [facility]) CM/SW Contact:  Dawayne Patricia, RN Phone Number: 01/24/2020, 3:16 PM   Clinical Narrative:    Pt stable for transition home today, order placed for DME- RW- call made to [employer]- per Adela Lank- RW to be dropped shipped to home due to no RW here in storage to deliver to bedside. Pt has been informed.  Checked in with Pam from [employer]- education at the bedside planned for 330pm today for home IV abx needs. OPAT orders in and signed. Pam has coordinated with Alvis Lemmings for [facility] needs. Call made to [facility] with [facility] to confirm. HHRN has been placed.     Final next level of care: [place] Barriers to Discharge: Barriers Resolved   Patient Goals and CMS Choice Patient states their goals for this hospitalization and ongoing recovery are:: return home CMS Medicare.gov Compare Post Acute Care list provided to:: Patient Choice offered to / list presented to : Patient  Discharge Placement                 Home with [facility]      Discharge Plan and Services   Discharge Planning Services: CM Consult Post Acute Care Choice: Home Health          DME Arranged: [place] rolling DME Agency: [employer] Date DME Agency Contacted: 01/24/20 Time DME Agency Contacted: 60 Representative spoke with at DME Agency: [place] Arranged: IV Antibiotics, RN [place] Agency: [place] Date [place]: 01/23/20 Time [place]: 18 Representative spoke with at [place]: [place] Determinants of Health ([place]) Interventions     Readmission Risk Interventions Readmission Risk Prevention Plan 01/24/2020  Post Dischage Appt Complete  Medication Screening Complete  Transportation Screening  Complete  Some recent data might be hidden

## 2020-01-27 LAB — AEROBIC/ANAEROBIC CULTURE W GRAM STAIN (SURGICAL/DEEP WOUND)
Culture: NO GROWTH
Culture: NO GROWTH

## 2020-01-28 NOTE — Discharge Summary (Signed)
Discharge Summary  Patient ID: [name] [id number] 74 y.o. Mar 27, 1945  Admit date: 01/22/2020  Discharge date and time: 01/24/2020  4:20 PM   Admitting Physician: Rosetta Posner, MD   Discharge Physician: Dr. Trula Slade  Admission Diagnoses: Surgical wound infection [T81.49XA]  Discharge Diagnoses: same  Admission Condition: fair  Discharged Condition: fair  Indication for Admission: Drainage from sinus tract of left groin with underlying prosthetic graft  Hospital Course: [name] is a 74 year old female who was noted to have several month history of drainage from a sinus tract of her left groin.  She previously underwent aortobifemoral bypass with Dr. Kellie Simmering in 2007.  She was brought in as an outpatient and underwent debridement and excision of sinus tract of left groin by Dr. Donnetta Hutching on 01/22/2020.  She tolerated the procedure well and was admitted to the hospital postoperatively.  Infectious disease was also consulted and recommended 6 weeks of daptomycin.  She had a PICC line placed on postoperative day #1.  She was discharged home with home health for IV antibiotics on postoperative day #2.  She will follow-up with Dr. Donnetta Hutching in the [place] office in about 3 weeks.  She was prescribed 2 to 3 days of narcotic pain medication for continued postoperative pain control.  She was discharged in stable condition.  Consults: ID  Treatments: surgery: Debridement and excision of sinus tract left groin by Dr. Donnetta Hutching on 01/22/2020  Discharge Exam: See progress note 01/24/2020 Vitals:   01/24/20 1306 01/24/20 1331  BP: (!) 176/75 (!) 155/72  Pulse: (!) 57   Resp: 14   Temp: 98.1 F (36.7 C)   SpO2: 91%      Disposition: Discharge disposition: 01-Home or Self Care       Patient Instructions:  Allergies as of 01/24/2020      Reactions   Penicillins Swelling   Citalopram Other (See Comments)   nightmares   Morphine And Related Nausea And Vomiting   Losartan Nausea  Only   Nausea and dizzy      Medication List    STOP taking these medications   HYDROcodone-acetaminophen 5-325 MG tablet Commonly known as: NORCO/VICODIN     TAKE these medications   albuterol 108 (90 Base) MCG/ACT inhaler Commonly known as: VENTOLIN HFA Inhale 2 puffs into the lungs every 6 (six) hours as needed for wheezing or shortness of breath.   apixaban 5 MG Tabs tablet Commonly known as: ELIQUIS Take 1 tablet (5 mg total) by mouth 2 (two) times daily.   calcium carbonate 1250 (500 Ca) MG tablet Commonly known as: OS-CAL - dosed in mg of elemental calcium Take 1 tablet by mouth daily.   carvedilol 6.25 MG tablet Commonly known as: COREG Take 1 tablet (6.25 mg total) by mouth 2 (two) times daily.   citalopram 20 MG tablet Commonly known as: CELEXA Take 20 mg by mouth daily.   cyanocobalamin 1000 MCG tablet Take 1,000 mcg by mouth daily.   daptomycin  IVPB Commonly known as: CUBICIN Inject 400 mg into the vein daily. Indication:  Vascular graft infection First Dose: Yes Last Day of Therapy:  03/04/2020 Labs - Once weekly:  CBC/D, BMP, and CPK Labs - Every other week:  ESR and CRP Method of administration: IV Push Method of administration may be changed at the discretion of home infusion pharmacist based upon assessment of the patient and/or caregiver's ability to self-administer the medication ordered.   dicyclomine 20 MG tablet Commonly known as: BENTYL Take 20  mg by mouth in the morning, at noon, and at bedtime.   Levoxyl 112 MCG tablet Generic drug: levothyroxine Take 112 mcg by mouth daily before breakfast.   multivitamin capsule Take 1 capsule by mouth daily.   naloxone 4 MG/0.1ML Liqd nasal spray kit Commonly known as: NARCAN Place 1 spray into the nose once.   oxyCODONE-acetaminophen 5-325 MG tablet Commonly known as: Percocet Take 1 tablet by mouth every 6 (six) hours as needed for severe pain.   rosuvastatin 20 MG tablet Commonly known  as: CRESTOR Take 20 mg by mouth daily.   tiZANidine 4 MG tablet Commonly known as: ZANAFLEX Take 1 tablet by mouth as needed.            Discharge Care Instructions  (From admission, onward)         Start     Ordered   01/24/20 0000  Change dressing on IV access line weekly and PRN  (Home infusion instructions - [facility] )        01/24/20 1337         Activity: activity as tolerated Diet: regular diet Wound Care: keep wound clean and dry  Follow-up with Dr. Donnetta Hutching in 3 weeks.  Signed: Dagoberto Ligas, PA-C 01/28/2020 10:13 AM [facility] Office: [phone]

## 2020-01-30 ENCOUNTER — Ambulatory Visit: Payer: Medicare HMO | Admitting: Family

## 2020-01-30 ENCOUNTER — Other Ambulatory Visit: Payer: Self-pay

## 2020-01-30 ENCOUNTER — Encounter: Payer: Self-pay | Admitting: Family

## 2020-01-30 DIAGNOSIS — T827XXA Infection and inflammatory reaction due to other cardiac and vascular devices, implants and grafts, initial encounter: Secondary | ICD-10-CM | POA: Diagnosis not present

## 2020-01-30 NOTE — Assessment & Plan Note (Addendum)
[name] is a 74 year old female with vascular graft infection s/p aortobifemoral bypass surgery in 2006 with surgical specimens growing methicillin resistant Staph epidermidis.  She remains on daptomycin with no adverse side effects.  Discussed continued need for antibiotic therapy with scheduled end date of 1/12.  Continue to monitor CK levels while on daptomycin.  Wound care per vascular surgery. Plan for follow up in 3 weeks or sooner if needed.

## 2020-01-30 NOTE — Patient Instructions (Signed)
Nice to see you.  We will continue with your Daptomycin as prescribed.  Continue with wound care as directed by Dr. Donnetta Hutching  Plan for follow up in 3 weeks or sooner if needed with [name] or Dr. Juleen China.  Have a great day and stay safe!  Happy Holidays!

## 2020-01-30 NOTE — Progress Notes (Signed)
Subjective:    Patient ID: [name], female    DOB: 1945-09-25, 74 y.o.   MRN: [id number]  Chief Complaint  Patient presents with  . Vascular Graft Infection    HPI:  [name] is a 74 y.o. female with previous medical history as listed below and significant for previous aortobifemoral bypass in 2006 who developed a area to the left groin that wax and waned and recently admitted by vascular surgery for exploration and excision.  During the operative procedure there was purulent drainage from the sinus tract itself prior to incision which was sent for Gram stain and culture.  The sinus tract was excised down further and communicated to the fluid around the dacryon graft material at the base of the incision.  The graft was left in place.  Surgical specimen (1/3) with Gram stain showing gram-positive cocci in pairs cultures growing oxacillin resistant Staph epidermidis/MRSE.  Initially started on vancomycin with concerns for long-term therapy given tenuous kidney function.  She was changed to daptomycin with goal of 6 weeks of therapy through PICC line with an end date of 03/04/2020.  Baseline CK level 8.  Of note, rifampin was unable to be added secondary to drug drug interactions.  Ms. Dix is here today for routine hospital follow-up.  Ms. Pentland has been doing well since leaving the hospital and has been receiving her daptomycin daily with no adverse side effects or missed doses.  Continues basic wound care with gauze over her wound.  Denies drainage, fever, or chills.  She did bump her PICC line last night had concerned about bleeding.  PICC line continues to function without problem and located in her right upper arm.  Follow-up with Dr. Donnetta Hutching in a couple weeks.   Allergies  Allergen Reactions  . Penicillins Swelling  . Citalopram Other (See Comments)    nightmares  . Morphine And Related Nausea And Vomiting  . Losartan Nausea Only    Nausea and dizzy      Outpatient  Medications Prior to Visit  Medication Sig Dispense Refill  . albuterol (VENTOLIN HFA) 108 (90 Base) MCG/ACT inhaler Inhale 2 puffs into the lungs every 6 (six) hours as needed for wheezing or shortness of breath.    Marland Kitchen apixaban (ELIQUIS) 5 MG TABS tablet Take 1 tablet (5 mg total) by mouth 2 (two) times daily. 60 tablet 11  . calcium carbonate (OS-CAL - DOSED IN MG OF ELEMENTAL CALCIUM) 1250 (500 Ca) MG tablet Take 1 tablet by mouth daily.    . carvedilol (COREG) 6.25 MG tablet Take 1 tablet (6.25 mg total) by mouth 2 (two) times daily. 60 tablet 11  . citalopram (CELEXA) 20 MG tablet Take 20 mg by mouth daily.    . cyanocobalamin 1000 MCG tablet Take 1,000 mcg by mouth daily.     . daptomycin (CUBICIN) IVPB Inject 400 mg into the vein daily. Indication:  Vascular graft infection First Dose: Yes Last Day of Therapy:  03/04/2020 Labs - Once weekly:  CBC/D, BMP, and CPK Labs - Every other week:  ESR and CRP Method of administration: IV Push Method of administration may be changed at the discretion of home infusion pharmacist based upon assessment of the patient and/or caregiver's ability to self-administer the medication ordered. 40 Units 0  . dicyclomine (BENTYL) 20 MG tablet Take 20 mg by mouth in the morning, at noon, and at bedtime.     Marland Kitchen levothyroxine (SYNTHROID) 112 MCG tablet Take 112 mcg by  mouth daily before breakfast.     . Multiple Vitamin (MULTIVITAMIN) capsule Take 1 capsule by mouth daily.    . naloxone (NARCAN) nasal spray 4 mg/0.1 mL Place 1 spray into the nose once.     Marland Kitchen oxyCODONE-acetaminophen (PERCOCET) 5-325 MG tablet Take 1 tablet by mouth every 6 (six) hours as needed for severe pain. 12 tablet 0  . rosuvastatin (CRESTOR) 20 MG tablet Take 20 mg by mouth daily.    Marland Kitchen tiZANidine (ZANAFLEX) 4 MG tablet Take 1 tablet by mouth as needed.     No facility-administered medications prior to visit.     Past Medical History:  Diagnosis Date  . Anxiety    denies  . 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 Depression   . Hepatitis   . Hyperlipidemia   . IBS (irritable bowel syndrome)   . Myocardial infarction ([place])   . Peripheral vascular disease ([place])    had Carotid En  . Rheumatic fever   . Stroke [facility])    no residual- except left side of face has a small "twist"  . Thyroid disease    Hypothyroidism      Past Surgical History:  Procedure Laterality Date  . CAROTID ENDARTERECTOMY  11/25/1999   S/P Left  . CAROTID ENDARTERECTOMY  03/24/2004   Left CCA stent- Dr. Amedeo Plenty  . CAROTID STENT  2006  . CHOLECYSTECTOMY    . COLONOSCOPY N/A 05/08/2013   Procedure: COLONOSCOPY;  Surgeon: Rogene Houston, MD;  Location: [facility] SUITE;  Service: Endoscopy;  Laterality: N/A;  230-moved to [place] notified pt  . EYE SURGERY Bilateral    cataract removed lens implanted  . PR VEIN BYPASS GRAFT,AORTO-FEM-POP  2001  . WOUND DEBRIDEMENT Left 01/22/2020   Procedure: DEBRIDEMENT LEFT GROIN WOUND;  Surgeon: Rosetta Posner, MD;  Location[facility] OR;  Service: Vascular;  Laterality: Left;      Family History  Problem Relation Age of Onset  . Cancer Sister   . Hypertension Father   . Heart attack Mother   . Hyperlipidemia Mother   . Kidney disease Brother   . Cancer Brother   . Cancer Brother        Lung   . Colon cancer Neg Hx       Social History   Socioeconomic History  . Marital status: Married    Spouse name: Not on file  . Number of children: Not on file  . Years of education: Not on file  . Highest education level: Not on file  Occupational History  . Not on file  Tobacco Use  . Smoking status: Current Every Day Smoker    Packs/day: 0.33    Years: 63.00    Pack years: 20.79    Types: Cigarettes  . Smokeless tobacco: Never Used  . Tobacco comment: 1 pack every 3 days x 24yr  Vaping Use  . Vaping Use: Never used  Substance and Sexual Activity  . Alcohol use: No  . Drug use: No   . Sexual activity: Not on file  Other Topics Concern  . Not on file  Social History Narrative  . Not on file   Social Determinants of Health   Financial Resource Strain: Not on file  Food Insecurity: Not on file  Transportation Needs: Not on file  Physical Activity: Not on file  Stress: Not on file  Social Connections: Not on file  Intimate  Partner Violence: Not on file      Review of Systems  Constitutional: Negative for chills, diaphoresis, fatigue and fever.  Respiratory: Negative for cough, chest tightness, shortness of breath and wheezing.   Cardiovascular: Negative for chest pain.  Gastrointestinal: Negative for abdominal pain, diarrhea, nausea and vomiting.       Objective:    BP (!) 192/82   Pulse 66   Temp 97.9 F (36.6 C) (Oral)   Wt 112 lb (50.8 kg)   BMI 22.62 kg/m  Nursing note and vital signs reviewed.  Physical Exam Constitutional:      General: She is not in acute distress.    Appearance: She is well-developed and well-nourished.  Cardiovascular:     Rate and Rhythm: Normal rate and regular rhythm.     Pulses: Intact distal pulses.     Heart sounds: Normal heart sounds.     Comments: PICC line in right upper extremity has dressing that is clean and dry with Biopatch in place. Pulmonary:     Effort: Pulmonary effort is normal.     Breath sounds: Normal breath sounds.  Skin:    General: Skin is warm and dry.     Comments: Surgical incision located in the left groin is clean, dry, and well approximated with sutures in place.  No drainage or odor present.  Neurological:     Mental Status: She is alert and oriented to person, place, and time.  Psychiatric:        Mood and Affect: Mood and affect normal.        Behavior: Behavior normal.        Thought Content: Thought content normal.        Judgment: Judgment normal.         Assessment & Plan:   Patient Active Problem List   Diagnosis Date Noted  . Surgical wound infection 01/22/2020   . S/P aortobifemoral bypass surgery 01/22/2020  . Vascular graft infection ([place]) 01/22/2020  . Essential hypertension 03/09/2016  . PAF (paroxysmal atrial fibrillation) ([place]) 03/09/2016  . IBS (irritable bowel syndrome) 09/17/2013  . Diarrhea 04/24/2013  . Aftercare following surgery of the circulatory system, [place] 01/30/2013  . Peripheral vascular disease, unspecified ([place]) 01/30/2013  . Pain in limb 01/02/2012  . Occlusion and stenosis of carotid artery without mention of cerebral infarction 06/27/2011     Problem List Items Addressed This Visit      Cardiovascular and Mediastinum   Vascular graft infection [facility])    Ms. Stroope is a 74 year old female with vascular graft infection s/p aortobifemoral bypass surgery in 2006 with surgical specimens growing methicillin resistant Staph epidermidis.  She remains on daptomycin with no adverse side effects.  Discussed continued need for antibiotic therapy with scheduled end date of 1/12.  Continue to monitor CK levels while on daptomycin.  Wound care per vascular surgery. Plan for follow up in 3 weeks or sooner if needed.           I am having [name] maintain her rosuvastatin, levothyroxine, dicyclomine, apixaban, carvedilol, multivitamin, cyanocobalamin, calcium carbonate, tiZANidine, naloxone, citalopram, albuterol, daptomycin, and oxyCODONE-acetaminophen.   Follow-up: Return in about 3 weeks (around 02/20/2020), or if symptoms worsen or fail to improve.    Terri Piedra, MSN, FNP-C Nurse Practitioner [facility] for Infectious Disease [place] number: [phone]

## 2020-02-10 ENCOUNTER — Other Ambulatory Visit: Payer: Self-pay

## 2020-02-10 ENCOUNTER — Encounter: Payer: Self-pay | Admitting: Vascular Surgery

## 2020-02-10 ENCOUNTER — Ambulatory Visit (INDEPENDENT_AMBULATORY_CARE_PROVIDER_SITE_OTHER): Payer: Self-pay | Admitting: Vascular Surgery

## 2020-02-10 VITALS — BP 167/89 | HR 52 | Temp 97.5°F | Resp 14 | Ht 59.0 in | Wt 111.0 lb

## 2020-02-10 DIAGNOSIS — I739 Peripheral vascular disease, unspecified: Secondary | ICD-10-CM

## 2020-02-10 NOTE — Progress Notes (Signed)
V[facility]ist of [place]  Patient name: [name] MRN: [id number] DOB: 1945-08-10 Sex: female  REASON FOR VISIT: Follow-up left groin debridement  HPI: [name] is a 74 y.o. female here today for follow-up.  She is here with her daughter.  She had undergone prior aortobifemoral bypass with Dr. Kellie Simmering in 2006.  She presented with sinus tract drainage from her left groin and was taken to the operating room by myself on 01/22/2020.  She had a sinus tract that continued down to the Dacron graft in her left groin.  She had purulence at the skin edge of the sinus tract and this grew methicillin-resistant staph aureus.  I also cultured the area on the Dacron graft itself where it had some area that was not incorporated.  This did not show any growth on culture.  She was started on daptomycin by the infectious disease consultant and is to continue this through 03/04/2020.  Current Outpatient Medications  Medication Sig Dispense Refill  . albuterol (VENTOLIN HFA) 108 (90 Base) MCG/ACT inhaler Inhale 2 puffs into the lungs every 6 (six) hours as needed for wheezing or shortness of breath.    Marland Kitchen apixaban (ELIQUIS) 5 MG TABS tablet Take 1 tablet (5 mg total) by mouth 2 (two) times daily. 60 tablet 11  . calcium carbonate (OS-CAL - DOSED IN MG OF ELEMENTAL CALCIUM) 1250 (500 Ca) MG tablet Take 1 tablet by mouth daily.    . carvedilol (COREG) 6.25 MG tablet Take 1 tablet (6.25 mg total) by mouth 2 (two) times daily. 60 tablet 11  . citalopram (CELEXA) 20 MG tablet Take 20 mg by mouth daily.    . cyanocobalamin 1000 MCG tablet Take 1,000 mcg by mouth daily.     . daptomycin (CUBICIN) IVPB Inject 400 mg into the vein daily. Indication:  Vascular graft infection First Dose: Yes Last Day of Therapy:  03/04/2020 Labs - Once weekly:  CBC/D, BMP, and CPK Labs - Every other week:  ESR and CRP Method of administration: IV Push Method of administration may  be changed at the discretion of home infusion pharmacist based upon assessment of the patient and/or caregiver's ability to self-administer the medication ordered. 40 Units 0  . dicyclomine (BENTYL) 20 MG tablet Take 20 mg by mouth in the morning, at noon, and at bedtime.     Marland Kitchen levothyroxine (SYNTHROID) 112 MCG tablet Take 112 mcg by mouth daily before breakfast.     . Multiple Vitamin (MULTIVITAMIN) capsule Take 1 capsule by mouth daily.    . naloxone (NARCAN) nasal spray 4 mg/0.1 mL Place 1 spray into the nose once.     Marland Kitchen oxyCODONE-acetaminophen (PERCOCET) 5-325 MG tablet Take 1 tablet by mouth every 6 (six) hours as needed for severe pain. 12 tablet 0  . rosuvastatin (CRESTOR) 20 MG tablet Take 20 mg by mouth daily.    Marland Kitchen tiZANidine (ZANAFLEX) 4 MG tablet Take 1 tablet by mouth as needed.     No current facility-administered medications for this visit.     PHYSICAL EXAM: Vitals:   02/10/20 1104  BP: (!) 167/89  Pulse: (!) 52  Resp: 14  Temp: (!) 97.5 F (36.4 C)  TempSrc: Other (Comment)  SpO2: 96%  Weight: 111 lb (50.3 kg)  Height: _0  (1.499 m)    GENERAL: The patient is a well-nourished female, in no acute distress. The vital signs are documented above. Her groin incision is completely healed.  She had 3 nylon  sutures which were removed today.  MEDICAL ISSUES: Area of induration now 15 years out from aortobifemoral bypass.  She is to continue for 3 additional weeks per infectious disease.  I will see her back in 2 months.  Feel quite hopeful that this is not going to involve her entire graft.  We will see her in 2 months and she will notify should she develop any wound issues in the interim   Rosetta Posner, MD FACS [facility] Tel [phone]

## 2020-02-20 ENCOUNTER — Ambulatory Visit: Payer: Medicare HMO | Admitting: Family

## 2020-02-20 ENCOUNTER — Telehealth: Payer: Self-pay

## 2020-02-20 ENCOUNTER — Other Ambulatory Visit: Payer: Self-pay

## 2020-02-20 ENCOUNTER — Encounter: Payer: Self-pay | Admitting: Family

## 2020-02-20 VITALS — BP 164/73 | HR 63 | Temp 98.2°F | Ht 59.0 in | Wt 111.0 lb

## 2020-02-20 DIAGNOSIS — T827XXD Infection and inflammatory reaction due to other cardiac and vascular devices, implants and grafts, subsequent encounter: Secondary | ICD-10-CM | POA: Diagnosis not present

## 2020-02-20 DIAGNOSIS — Z95828 Presence of other vascular implants and grafts: Secondary | ICD-10-CM | POA: Diagnosis not present

## 2020-02-20 NOTE — Assessment & Plan Note (Signed)
PICC line remains in place with clean and dry dressing. No signs of infection or complications. Continue routine PICC care per protocol.

## 2020-02-20 NOTE — Progress Notes (Signed)
Subjective:    Patient ID: [name], female    DOB: Oct 09, 1945, 74 y.o.   MRN: [id number]  Chief Complaint  Patient presents with  . Follow-up     HPI:  [name] is a 74 y.o. 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 Here today for routine follow up.  [name] has been receiving her Daptomycin daily as prescribed with no adverse side effects or missed doses. Healing well. Has some occasional soreness in her right arm around her PICC line at times. Denies fevers or chills.    Allergies  Allergen Reactions  . Penicillins Swelling  . Citalopram Other (See Comments)    nightmares  . Morphine And Related Nausea And Vomiting  . Losartan Nausea Only    Nausea and dizzy      Outpatient Medications Prior to Visit  Medication Sig Dispense Refill  . albuterol (VENTOLIN HFA) 108 (90 Base) MCG/ACT inhaler Inhale 2 puffs into the lungs every 6 (six) hours as needed for wheezing or shortness of breath.    Marland Kitchen apixaban (ELIQUIS) 5 MG TABS tablet Take 1 tablet (5 mg total) by mouth 2 (two) times daily. 60 tablet 11  . calcium carbonate (OS-CAL - DOSED IN MG OF ELEMENTAL CALCIUM) 1250 (500 Ca) MG tablet Take 1 tablet by mouth daily.    . carvedilol (COREG) 6.25 MG tablet Take 1 tablet (6.25 mg total) by mouth 2 (two) times daily. 60 tablet 11  . citalopram (CELEXA) 20 MG tablet Take 20 mg by mouth daily.    . cyanocobalamin 1000 MCG tablet Take 1,000 mcg by mouth daily.     . daptomycin (CUBICIN) IVPB Inject 400 mg into the vein daily. Indication:  Vascular graft infection First Dose: Yes Last Day of Therapy:  03/04/2020 Labs - Once weekly:  CBC/D, BMP, and CPK Labs - Every other week:  ESR and CRP Method of administration: IV Push Method of administration may be changed at the discretion of home infusion pharmacist based upon assessment of the patient and/or caregiver's ability to self-administer the medication  ordered. 40 Units 0  . dicyclomine (BENTYL) 20 MG tablet Take 20 mg by mouth in the morning, at noon, and at bedtime.     Marland Kitchen HYDROcodone-acetaminophen (NORCO/VICODIN) 5-325 MG tablet Take 1 tablet by mouth 4 (four) times daily as needed.    Marland Kitchen levothyroxine (SYNTHROID) 112 MCG tablet Take 112 mcg by mouth daily before breakfast.     . Multiple Vitamin (MULTIVITAMIN) capsule Take 1 capsule by mouth daily.    . naloxone (NARCAN) nasal spray 4 mg/0.1 mL Place 1 spray into the nose once.     . rosuvastatin (CRESTOR) 20 MG tablet Take 20 mg by mouth daily.    Marland Kitchen oxyCODONE-acetaminophen (PERCOCET) 5-325 MG tablet Take 1 tablet by mouth every 6 (six) hours as needed for severe pain. (Patient not taking: Reported on 02/20/2020) 12 tablet 0  . tiZANidine (ZANAFLEX) 4 MG tablet Take 1 tablet by mouth as needed. (Patient not taking: Reported on 02/20/2020)     No facility-administered medications prior to visit.     Past Medical History:  Diagnosis Date  . Anxiety    denies  . Carotid artery occlusion 05/08/2007  . 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 Depression   . Hepatitis   . Hyperlipidemia   . IBS (irritable bowel syndrome)   . Myocardial infarction ([place])   .  Peripheral vascular disease ([place])    had Carotid En  . Rheumatic fever   . Stroke [facility])    no residual- except left side of face has a small "twist"  . Thyroid disease    Hypothyroidism     Past Surgical History:  Procedure Laterality Date  . CAROTID ENDARTERECTOMY  11/25/1999   S/P Left  . CAROTID ENDARTERECTOMY  03/24/2004   Left CCA stent- Dr. Amedeo Plenty  . CAROTID STENT  2006  . CHOLECYSTECTOMY    . COLONOSCOPY N/A 05/08/2013   Procedure: COLONOSCOPY;  Surgeon: Rogene Houston, MD;  [facility];  Service: Endoscopy;  Laterality: N/A;  230-moved to [place] notified pt  . EYE SURGERY Bilateral    cataract removed lens implanted  . PR VEIN BYPASS GRAFT,AORTO-FEM-POP  2001  . WOUND  DEBRIDEMENT Left 01/22/2020   Procedure: DEBRIDEMENT LEFT GROIN WOUND;  Surgeon: Rosetta Posner, MD;  [facility];  Service: Vascular;  Laterality: Left;       Review of Systems  Constitutional: Negative for chills, diaphoresis, fatigue and fever.  Respiratory: Negative for cough, chest tightness, shortness of breath and wheezing.   Cardiovascular: Negative for chest pain.  Gastrointestinal: Negative for abdominal pain, diarrhea, nausea and vomiting.      Objective:    BP (!) 164/73   Pulse 63   Temp 98.2 F (36.8 C) (Oral)   Ht 4' 11"  (1.499 m)   Wt 111 lb (50.3 kg)   SpO2 98%   BMI 22.42 kg/m  Nursing note and vital signs reviewed.  Physical Exam Constitutional:      General: She is not in acute distress.    Appearance: She is well-developed and well-nourished.  Cardiovascular:     Rate and Rhythm: Normal rate and regular rhythm.     Pulses: Intact distal pulses.     Heart sounds: Normal heart sounds.     Comments: PICC line right upper extremity with clean and dry dressing. No evidence of infection.  Pulmonary:     Effort: Pulmonary effort is normal.     Breath sounds: Normal breath sounds.  Skin:    General: Skin is warm and dry.  Neurological:     Mental Status: She is alert.  Psychiatric:        Mood and Affect: Mood and affect and mood normal.      Depression screen [facility] 2/9 02/20/2020  Decreased Interest 0  Down, Depressed, Hopeless 0  PHQ - 2 Score 0       Assessment & Plan:    Patient Active Problem List   Diagnosis Date Noted  . Surgical wound infection 01/22/2020  . S/P PICC central line placement 01/22/2020  . 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 Essential hypertension 03/09/2016  . PAF (paroxysmal atrial fibrillation) ([place]) 03/09/2016  . IBS (irritable bowel syndrome) 09/17/2013  . Diarrhea 04/24/2013  . Aftercare following surgery of the circulatory system, [place] 01/30/2013  . Peripheral vascular disease, unspecified ([place])  01/30/2013  . Pain in limb 01/02/2012  . Occlusion and stenosis of carotid artery without mention of cerebral infarction 06/27/2011     Problem List Items Addressed This Visit      Cardiovascular and Mediastinum   Vascular graft infection [facility] - Primary    Ms. Hagger continues to receive Daptomycin for vascular graft infection with MRSE. Current end date planned for 1/12. Given the depth of infection, despite cultures being negative from graft would likely need to consider that  graft is infection. Will discuss with Dr. Juleen China to discuss ending antibiotics as planned versus extending treatment. Continue current dose of daptomycin.         Other   S/P PICC central line placement    PICC line remains in place with clean and dry dressing. No signs of infection or complications. Continue routine PICC care per protocol.           I am having [name] maintain her rosuvastatin, levothyroxine, dicyclomine, apixaban, carvedilol, multivitamin, cyanocobalamin, calcium carbonate, tiZANidine, naloxone, citalopram, albuterol, daptomycin, oxyCODONE-acetaminophen, and HYDROcodone-acetaminophen.   Follow-up: Return in about 1 month (around 03/22/2020), or if symptoms worsen or fail to improve.   Terri Piedra, MSN, FNP-C Nurse Practitioner [facility] for Infectious Disease [place] number: [phone]

## 2020-02-20 NOTE — Assessment & Plan Note (Signed)
[name] continues to receive Daptomycin for vascular graft infection with MRSE. Current end date planned for 1/12. Given the depth of infection, despite cultures being negative from graft would likely need to consider that graft is infection. Will discuss with Dr. Earlene Plater to discuss ending antibiotics as planned versus extending treatment. Continue current dose of daptomycin.

## 2020-02-20 NOTE — Patient Instructions (Addendum)
Nice to see you  Continue to take your Daptomycin as prescribed through 1/12.  We will determine if we need to continue antibiotics with ciprofloxacin after PICC removal if needed.  Plan for follow up in 1 month or sooner if needed with Dr. Earlene Plater or myself.  Have a great day and stay safe!

## 2020-02-20 NOTE — Telephone Encounter (Signed)
Spoke with Debbie at [facility] to [employer] orders per Marcos Eke, NP that okay to pull PICC at end of treatment on 03/04/20. Orders repeated and verified.   Sandie Ano, RN

## 2020-03-02 ENCOUNTER — Telehealth: Payer: Self-pay | Admitting: Family

## 2020-03-02 NOTE — Telephone Encounter (Signed)
Please notify home health provider to pull PICC at the complete of treatment and no additional antibiotics are needed at this time.   Terri Piedra, NP 03/02/2020 12:07 PM

## 2020-03-02 NOTE — Telephone Encounter (Signed)
Spoke to [employer] at [facility] to relay per Terri Piedra, NP that okay to pull PICC at end of treatment and no additional need for antibiotics at this time. Orders repeated and verified.   Beryle Flock, RN

## 2020-03-23 ENCOUNTER — Ambulatory Visit: Payer: Medicare HMO | Admitting: Family

## 2020-03-23 ENCOUNTER — Other Ambulatory Visit: Payer: Self-pay

## 2020-03-23 ENCOUNTER — Encounter: Payer: Self-pay | Admitting: Family

## 2020-03-23 DIAGNOSIS — T827XXD Infection and inflammatory reaction due to other cardiac and vascular devices, implants and grafts, subsequent encounter: Secondary | ICD-10-CM | POA: Diagnosis not present

## 2020-03-23 NOTE — Progress Notes (Signed)
Subjective:    Patient ID: [name], female    DOB: 10-03-1945, 74 y.o.   MRN: [id number]  Chief Complaint  Patient presents with  . Vascular Graft Infection     HPI:  [name] is a 75 y.o. female with MRSE vascular graft infection last seen on 12/30 with 2 weeks remaining in her Daptomycin treatment which she completed on 03/04/20 with PICC line being removed. Inflammatory markers on 02/10/20 with CRP of 1 and Sed rate of 13. Here today for routine follow up.  Finished her antibiotics on 5/80 without complications. Wound site is healed over with no drainage. Has been having low back/flank pain being followed by [facility] for hydronephrosis. Denies any systemic symptoms including fevers/chills.  Allergies  Allergen Reactions  . Penicillins Swelling  . Citalopram Other (See Comments)    nightmares  . Morphine And Related Nausea And Vomiting  . Losartan Nausea Only    Nausea and dizzy      Outpatient Medications Prior to Visit  Medication Sig Dispense Refill  . albuterol (VENTOLIN HFA) 108 (90 Base) MCG/ACT inhaler Inhale 2 puffs into the lungs every 6 (six) hours as needed for wheezing or shortness of breath.    Marland Kitchen apixaban (ELIQUIS) 5 MG TABS tablet Take 1 tablet (5 mg total) by mouth 2 (two) times daily. 60 tablet 11  . calcium carbonate (OS-CAL - DOSED IN MG OF ELEMENTAL CALCIUM) 1250 (500 Ca) MG tablet Take 1 tablet by mouth daily.    . carvedilol (COREG) 6.25 MG tablet Take 1 tablet (6.25 mg total) by mouth 2 (two) times daily. 60 tablet 11  . citalopram (CELEXA) 20 MG tablet Take 20 mg by mouth daily.    . cyanocobalamin 1000 MCG tablet Take 1,000 mcg by mouth daily.     Marland Kitchen dicyclomine (BENTYL) 20 MG tablet Take 20 mg by mouth in the morning, at noon, and at bedtime.     Marland Kitchen HYDROcodone-acetaminophen (NORCO/VICODIN) 5-325 MG tablet Take 1 tablet by mouth 4 (four) times daily as needed.    Marland Kitchen levothyroxine (SYNTHROID) 112 MCG tablet Take 112 mcg by mouth daily  before breakfast.     . Multiple Vitamin (MULTIVITAMIN) capsule Take 1 capsule by mouth daily.    . naloxone (NARCAN) nasal spray 4 mg/0.1 mL Place 1 spray into the nose once.     Marland Kitchen oxyCODONE-acetaminophen (PERCOCET) 5-325 MG tablet Take 1 tablet by mouth every 6 (six) hours as needed for severe pain. 12 tablet 0  . rosuvastatin (CRESTOR) 20 MG tablet Take 20 mg by mouth daily.    Marland Kitchen tiZANidine (ZANAFLEX) 4 MG tablet Take 1 tablet by mouth as needed.     No facility-administered medications prior to visit.     Past Medical History:  Diagnosis Date  . Anxiety    denies  . Carotid artery occlusion 05/08/2007  . 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 Depression   . Hepatitis   . Hyperlipidemia   . IBS (irritable bowel syndrome)   . Myocardial infarction ([place])   . Peripheral vascular disease ([place])    had Carotid En  . Rheumatic fever   . Stroke [facility])    no residual- except left side of face has a small "twist"  . Thyroid disease    Hypothyroidism     Past Surgical History:  Procedure Laterality Date  . CAROTID ENDARTERECTOMY  11/25/1999   S/P Left  . CAROTID ENDARTERECTOMY  03/24/2004  Left CCA stent- Dr. Amedeo Plenty  . CAROTID STENT  2006  . CHOLECYSTECTOMY    . COLONOSCOPY N/A 05/08/2013   Procedure: COLONOSCOPY;  Surgeon: Rogene Houston, MD;  [facility];  Service: Endoscopy;  Laterality: N/A;  230-moved to [place] notified pt  . EYE SURGERY Bilateral    cataract removed lens implanted  . PR VEIN BYPASS GRAFT,AORTO-FEM-POP  2001  . WOUND DEBRIDEMENT Left 01/22/2020   Procedure: DEBRIDEMENT LEFT GROIN WOUND;  Surgeon: Rosetta Posner, MD;  Location: [facility] OR;  Service: Vascular;  Laterality: Left;    Review of Systems  Constitutional: Negative for chills, diaphoresis, fatigue and fever.  Respiratory: Negative for cough, chest tightness, shortness of breath and wheezing.   Cardiovascular: Negative for chest pain.  Gastrointestinal: Negative  for abdominal pain, diarrhea, nausea and vomiting.  Genitourinary: Positive for flank pain.  Musculoskeletal: Positive for back pain.      Objective:    Wt 111 lb (50.3 kg)   BMI 22.42 kg/m  Nursing note and vital signs reviewed.  Physical Exam Constitutional:      General: She is not in acute distress.    Appearance: She is well-developed and well-nourished.  Cardiovascular:     Rate and Rhythm: Normal rate and regular rhythm.     Pulses: Intact distal pulses.     Heart sounds: Normal heart sounds.  Pulmonary:     Effort: Pulmonary effort is normal.     Breath sounds: Normal breath sounds.  Skin:    General: Skin is warm and dry.  Neurological:     Mental Status: She is alert and oriented to person, place, and time.  Psychiatric:        Mood and Affect: Mood and affect normal.        Behavior: Behavior normal.        Thought Content: Thought content normal.        Judgment: Judgment normal.      Depression screen PHQ 2/9 02/20/2020  Decreased Interest 0  Down, Depressed, Hopeless 0  PHQ - 2 Score 0       Assessment & Plan:    Patient Active Problem List   Diagnosis Date Noted  . Surgical wound infection 01/22/2020  . S/P PICC central line placement 01/22/2020  . 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 Essential hypertension 03/09/2016  . PAF (paroxysmal atrial fibrillation) ([place]) 03/09/2016  . IBS (irritable bowel syndrome) 09/17/2013  . Diarrhea 04/24/2013  . Aftercare following surgery of the circulatory system, [place] 01/30/2013  . Peripheral vascular disease, unspecified ([place]) 01/30/2013  . Pain in limb 01/02/2012  . Occlusion and stenosis of carotid artery without mention of cerebral infarction 06/27/2011     Problem List Items Addressed This Visit      Cardiovascular and Mediastinum   Vascular graft infection [name])    Ms. Lasecki completed 6 weeks of antimicrobial therapy with daptomycin for MRSE vascular graft infection on 0/63/0160 without  complication.  Inflammatory markers within normal ranges.  Site has healed well with no drainage or other signs of infection.  We will continue off antibiotics.  Advised if she develops any new symptoms including fever, chills, swelling, or the wound opens to seek further care.  Continue with follow-up with vascular surgery as scheduled.  Additional follow-up with ID as necessary.          I am having [name] maintain her rosuvastatin, levothyroxine, dicyclomine, apixaban, carvedilol, multivitamin, cyanocobalamin, calcium carbonate, tiZANidine, naloxone,  citalopram, albuterol, oxyCODONE-acetaminophen, and HYDROcodone-acetaminophen.   Follow-up: Return if symptoms worsen or fail to improve.   Terri Piedra, MSN, FNP-C Nurse Practitioner [facility] for Infectious Disease [place] number: [phone]

## 2020-03-23 NOTE — Assessment & Plan Note (Signed)
[name] completed 6 weeks of antimicrobial therapy with daptomycin for MRSE vascular graft infection on 0/62/3762 without complication.  Inflammatory markers within normal ranges.  Site has healed well with no drainage or other signs of infection.  We will continue off antibiotics.  Advised if she develops any new symptoms including fever, chills, swelling, or the wound opens to seek further care.  Continue with follow-up with vascular surgery as scheduled.  Additional follow-up with ID as necessary.

## 2020-03-23 NOTE — Patient Instructions (Signed)
Nice to see you.  We will continue to monitor off antibiotics at this time.   Continue to follow up with Dr. Donnetta Hutching for routine follow up.  Monitor for fever, swelling, or any new drainage.  Follow up with ID as needed.

## 2020-04-07 ENCOUNTER — Telehealth: Payer: Self-pay

## 2020-04-07 NOTE — Telephone Encounter (Signed)
Patient called to ask Dr. Donnetta Hutching if she could stop taking her Eliquis for a urological procedure. Patient has a history of a-fib and the eliquis is prescribed by a cardiologist. Instructed patient that Dr. Cathie Olden would be the one to ask about stopping it.

## 2020-04-13 ENCOUNTER — Ambulatory Visit (INDEPENDENT_AMBULATORY_CARE_PROVIDER_SITE_OTHER): Payer: Medicare HMO | Admitting: Vascular Surgery

## 2020-04-13 ENCOUNTER — Encounter: Payer: Self-pay | Admitting: Vascular Surgery

## 2020-04-13 ENCOUNTER — Other Ambulatory Visit: Payer: Self-pay

## 2020-04-13 VITALS — BP 134/70 | HR 56 | Temp 98.0°F | Resp 14 | Ht 59.0 in | Wt 110.0 lb

## 2020-04-13 DIAGNOSIS — I739 Peripheral vascular disease, unspecified: Secondary | ICD-10-CM

## 2020-04-13 NOTE — Progress Notes (Signed)
[facility] [place]  Patient name: [name] MRN: [id number] DOB: 1945-10-22 Sex: female  REASON FOR VISIT: Follow-up excision sinus tract left groin  HPI: [name] is a 75 y.o. female here today for continued follow-up.  She is status post aortobifemoral bypass by Dr. Kellie Simmering in 2006.  She had presented several months ago with a small punctate area in her left groin.  She was taken to the operating room by myself on 01/22/2020 and had a sinus tract excised that extended down to the graft.  There was no frank fluid around the graft.  She was on 6 weeks of IV daptomycin for MRSE.  She is here today for follow-up.  Her main complaint is of back pain.  Apparently she is to have a procedure for "blockages in her kidneys in [place]".  I explained that the her back pain may be related to degenerative disc disease  Current Outpatient Medications  Medication Sig Dispense Refill  . albuterol (VENTOLIN HFA) 108 (90 Base) MCG/ACT inhaler Inhale 2 puffs into the lungs every 6 (six) hours as needed for wheezing or shortness of breath.    . calcium carbonate (OS-CAL - DOSED IN MG OF ELEMENTAL CALCIUM) 1250 (500 Ca) MG tablet Take 1 tablet by mouth daily.    . carvedilol (COREG) 6.25 MG tablet Take 1 tablet (6.25 mg total) by mouth 2 (two) times daily. 60 tablet 11  . citalopram (CELEXA) 20 MG tablet Take 20 mg by mouth daily.    . cyanocobalamin 1000 MCG tablet Take 1,000 mcg by mouth daily.     Marland Kitchen dicyclomine (BENTYL) 20 MG tablet Take 20 mg by mouth in the morning, at noon, and at bedtime.     Marland Kitchen HYDROcodone-acetaminophen (NORCO/VICODIN) 5-325 MG tablet Take 1 tablet by mouth 4 (four) times daily as needed.    Marland Kitchen levothyroxine (SYNTHROID) 112 MCG tablet Take 112 mcg by mouth daily before breakfast.     . Multiple Vitamin (MULTIVITAMIN) capsule Take 1 capsule by mouth daily.    . rosuvastatin (CRESTOR) 20 MG tablet Take 20 mg by mouth  daily.    Marland Kitchen apixaban (ELIQUIS) 5 MG TABS tablet Take 1 tablet (5 mg total) by mouth 2 (two) times daily. (Patient not taking: Reported on 04/13/2020) 60 tablet 11  . naloxone (NARCAN) nasal spray 4 mg/0.1 mL Place 1 spray into the nose once.  (Patient not taking: Reported on 04/13/2020)    . oxyCODONE-acetaminophen (PERCOCET) 5-325 MG tablet Take 1 tablet by mouth every 6 (six) hours as needed for severe pain. (Patient not taking: Reported on 04/13/2020) 12 tablet 0  . tiZANidine (ZANAFLEX) 4 MG tablet Take 1 tablet by mouth as needed. (Patient not taking: Reported on 04/13/2020)     No current facility-administered medications for this visit.     PHYSICAL EXAM: Vitals:   04/13/20 0849  BP: 134/70  Pulse: (!) 56  Resp: 14  Temp: 98 F (36.7 C)  TempSrc: Temporal  SpO2: 95%  Weight: 110 lb (49.9 kg)  Height: 4\' 11"  (1.499 m)    GENERAL: The patient is a well-nourished female, in no acute distress. The vital signs are documented above. Left groin incision is completely healed with no evidence of erythema or infection.  Right groin also without any erythema  MEDICAL ISSUES: Stable overall.  Status post excision of sinus tract in her left groin following aortobifemoral bypass graft in 2006.  6 weeks of IV antibiotics.  Will follow up  with [place] on an as-needed basis   Rosetta Posner, MD [facility] and [facility] Tel [phone]  Note: Portions of this report may have been transcribed using voice recognition software.  Every effort has been made to ensure accuracy; however, inadvertent computerized transcription errors may still be present.

## 2020-06-15 ENCOUNTER — Ambulatory Visit: Payer: Medicare HMO | Admitting: Vascular Surgery

## 2020-07-22 ENCOUNTER — Ambulatory Visit (HOSPITAL_COMMUNITY)
Admission: RE | Admit: 2020-07-22 | Payer: Medicare HMO | Source: Ambulatory Visit | Attending: Cardiovascular Disease | Admitting: Cardiovascular Disease

## 2020-07-22 ENCOUNTER — Ambulatory Visit (HOSPITAL_COMMUNITY): Payer: Medicare HMO

## 2020-07-30 ENCOUNTER — Other Ambulatory Visit: Payer: Self-pay

## 2020-07-30 ENCOUNTER — Encounter: Payer: Self-pay | Admitting: Vascular Surgery

## 2020-07-30 ENCOUNTER — Ambulatory Visit (INDEPENDENT_AMBULATORY_CARE_PROVIDER_SITE_OTHER): Payer: Medicare HMO | Admitting: Vascular Surgery

## 2020-07-30 VITALS — BP 185/77 | HR 60 | Temp 97.9°F | Resp 16 | Ht 59.0 in | Wt 104.0 lb

## 2020-07-30 DIAGNOSIS — T827XXD Infection and inflammatory reaction due to other cardiac and vascular devices, implants and grafts, subsequent encounter: Secondary | ICD-10-CM

## 2020-07-30 NOTE — Progress Notes (Signed)
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 female, status post aortobifemoral bypass graft by Dr. Kellie Simmering about 10 years ago.  Approximately 6 months ago she had I&D and her left groin by Dr. Donnetta Hutching.  MRSA was encountered.  The graft infection was thought to be confined to just the groin.  She was able to heal up the left groin wound.  She was on antibiotics for several weeks.  A few weeks ago she now has begun to develop a new area of redness and tenderness in the left groin.  There has been no drainage up to now.  She apparently was evaluated at [facility] and had a CT scan of the abdomen and pelvis there that per report shows inflammatory changes around the entire left limb of the graft.  The scan was unavailable for review today.  She does not really complain of fever or chills.  She did have bilateral ureteral stents placed recently for bilateral hydronephrosis.  She currently smokes about a pack of cigarettes per day but has tried to cut back.  She is on Eliquis and Crestor.  She is on the Eliquis for paroxysmal atrial fibrillation.  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   Cerebrovascular occlusion 2001   Extracranial with Hx of TIA's   Depression    Hepatitis    Hyperlipidemia    IBS (irritable bowel syndrome)    Myocardial infarction [facility])    Peripheral vascular disease (HCC)    had Carotid En   Rheumatic fever    Stroke (HCC)    no residual- except left side of face has a small "twist"   Thyroid disease    Hypothyroidism   Past Surgical History:  Procedure Laterality Date   CAROTID ENDARTERECTOMY  11/25/1999   S/P Left   CAROTID ENDARTERECTOMY  03/24/2004   Left CCA stent- Dr. Amedeo Plenty   CAROTID STENT  2006   CHOLECYSTECTOMY     COLONOSCOPY N/A 05/08/2013   Procedure: COLONOSCOPY;  Surgeon:  Rogene Houston, MD;  [facility];  Service: Endoscopy;  Laterality: N/A;  230-moved to 30 Ann notified pt   EYE SURGERY Bilateral    cataract removed lens implanted   PR VEIN BYPASS GRAFT,AORTO-FEM-POP  2001   WOUND DEBRIDEMENT Left 01/22/2020   Procedure: DEBRIDEMENT LEFT GROIN WOUND;  Surgeon: Rosetta Posner, MD;  Location: [facility];  Service: Vascular;  Laterality: Left;    Family History  Problem Relation Age of Onset   Cancer Sister    Hypertension Father    Heart attack Mother    Hyperlipidemia Mother    Kidney disease Brother    Cancer Brother    Cancer Brother        Lung    Colon cancer Neg Hx     SOCIAL HISTORY: Social History   Socioeconomic History   Marital status: Married    Spouse name: Not on file   Number of children: Not on file   Years of education: Not on file   Highest education level: Not on file  Occupational History   Not on file  Tobacco Use   Smoking status: Every Day    Packs/day: 0.33    Years: 63.00    Pack years: 20.79    Types: Cigarettes   Smokeless  tobacco: Never   Tobacco comments:    1 pack every 3 days x 17yrs  Vaping Use   Vaping Use: Never used  Substance and Sexual Activity   Alcohol use: No   Drug use: No   Sexual activity: Not on file  Other Topics Concern   Not on file  Social History Narrative   Not on file   Social Determinants of Health   Financial Resource Strain: Not on file  Food Insecurity: Not on file  Transportation Needs: Not on file  Physical Activity: Not on file  Stress: Not on file  Social Connections: Not on file  Intimate Partner Violence: Not on file    Allergies  Allergen Reactions   Penicillins Swelling   Citalopram Other (See Comments)    nightmares   Morphine And Related Nausea And Vomiting   Losartan Nausea Only    Nausea and dizzy    Current Outpatient Medications  Medication Sig Dispense Refill   albuterol (VENTOLIN HFA) 108 (90 Base) MCG/ACT inhaler Inhale 2 puffs  into the lungs every 6 (six) hours as needed for wheezing or shortness of breath.     apixaban (ELIQUIS) 5 MG TABS tablet Take 1 tablet (5 mg total) by mouth 2 (two) times daily. 60 tablet 11   calcium carbonate (OS-CAL - DOSED IN MG OF ELEMENTAL CALCIUM) 1250 (500 Ca) MG tablet Take 1 tablet by mouth daily.     carvedilol (COREG) 6.25 MG tablet Take 1 tablet (6.25 mg total) by mouth 2 (two) times daily. 60 tablet 11   citalopram (CELEXA) 20 MG tablet Take 20 mg by mouth daily.     cyanocobalamin 1000 MCG tablet Take 1,000 mcg by mouth daily.      dicyclomine (BENTYL) 20 MG tablet Take 20 mg by mouth in the morning, at noon, and at bedtime.      HYDROcodone-acetaminophen (NORCO/VICODIN) 5-325 MG tablet Take 1 tablet by mouth 4 (four) times daily as needed.     levothyroxine (SYNTHROID) 112 MCG tablet Take 112 mcg by mouth daily before breakfast.      Multiple Vitamin (MULTIVITAMIN) capsule Take 1 capsule by mouth daily.     naloxone (NARCAN) nasal spray 4 mg/0.1 mL Place 1 spray into the nose once.     rosuvastatin (CRESTOR) 20 MG tablet Take 20 mg by mouth daily.     tiZANidine (ZANAFLEX) 4 MG tablet Take 1 tablet by mouth as needed.     No current facility-administered medications for this visit.    ROS:   General:  No weight loss, Fever, chills  HEENT: No recent headaches, no nasal bleeding, no visual changes, no sore throat  Neurologic: No dizziness, blackouts, seizures. No recent symptoms of stroke or mini- stroke. No recent episodes of slurred speech, or temporary blindness.  Cardiac: No recent episodes of chest pain/pressure, no shortness of breath at rest.  No shortness of breath with exertion.  Denies history of atrial fibrillation or irregular heartbeat  Vascular: No history of rest pain in feet.  No history of claudication.  No history of non-healing ulcer, No history of DVT   Pulmonary: No home oxygen, no productive cough, no hemoptysis,  No asthma or  wheezing  Musculoskeletal:  [X]  Arthritis, [X]  Low back pain,  [X]  Joint pain  Hematologic:No history of hypercoagulable state.  No history of easy bleeding.  No history of anemia  Gastrointestinal: No hematochezia or melena,  No gastroesophageal reflux, no trouble swallowing  Urinary: [ ]  chronic Kidney  disease, [ ]  on HD - [ ]  MWF or [ ]  TTHS, [ ]  Burning with urination, [ ]  Frequent urination, [ ]  Difficulty urinating;   Skin: No rashes  Psychological: No history of anxiety,  No history of depression   Physical Examination  Vitals:   07/30/20 1532  BP: (!) 185/77  Pulse: 60  Resp: 16  Temp: 97.9 F (36.6 C)  SpO2: 94%  Weight: 104 lb (47.2 kg)  Height: 4\' 11"  (1.499 m)    Body mass index is 21.01 kg/m.  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  She is also going to get a CD from copy of her CT scan from [facility] from last week.  She will follow-up with me next week to discuss options and treatment plan.   Ruta Hinds, MD [facility] and Vein Specialists of [place] Office: [phone]

## 2020-07-31 ENCOUNTER — Other Ambulatory Visit: Payer: Self-pay | Admitting: *Deleted

## 2020-07-31 ENCOUNTER — Other Ambulatory Visit (HOSPITAL_COMMUNITY): Payer: Self-pay | Admitting: Vascular Surgery

## 2020-07-31 DIAGNOSIS — I714 Abdominal aortic aneurysm, without rupture, unspecified: Secondary | ICD-10-CM

## 2020-08-06 ENCOUNTER — Ambulatory Visit (INDEPENDENT_AMBULATORY_CARE_PROVIDER_SITE_OTHER): Payer: Medicare HMO | Admitting: Vascular Surgery

## 2020-08-06 ENCOUNTER — Encounter: Payer: Self-pay | Admitting: Vascular Surgery

## 2020-08-06 ENCOUNTER — Other Ambulatory Visit: Payer: Self-pay

## 2020-08-06 VITALS — BP 156/78 | HR 55 | Temp 97.9°F | Resp 16 | Ht 59.0 in | Wt 102.0 lb

## 2020-08-06 DIAGNOSIS — T827XXD Infection and inflammatory reaction due to other cardiac and vascular devices, implants and grafts, subsequent encounter: Secondary | ICD-10-CM | POA: Diagnosis not present

## 2020-08-06 NOTE — Progress Notes (Signed)
Patient is a 75 year old female who returns for follow-up today.  She was last seen July 30, 2020.  She was seen for concern for aortic graft infection.  She previously had I&D of the left groin in February 2022.  She continues to report no symptoms of fever chills nausea or vomiting.  She has had no drainage from the left groin.  It has not changed in appearance.  She has chronic back pain.  Unfortunately we were going to review her images from her CT scan from [facility] but the CT scan has never arrived at our office.  The patient was also scheduled for a tagged white blood cell scan and this has not yet been performed.  Physical exam:  Vitals:   08/06/20 0833  BP: (!) 156/78  Pulse: (!) 55  Resp: 16  Temp: 97.9 F (36.6 C)  SpO2: 96%  Weight: 102 lb (46.3 kg)  Height: 4\' 11"  (1.499 m)    Extremities: Left groin small area of erythema no fluctuance no mass, right groin no skin changes 2+ femoral dorsalis pedis pulses bilaterally  Assessment: No significant change left groin overall appearance is underwhelming.  We still need to review her CT scan of the abdomen and pelvis as well as obtain a tagged white cell scan.  Plan: The patient is going to again try to obtain a copy of the images of her CT scan of the abdomen and pelvis performed at [facility].  We are again going to try to set up her tagged white cell scan.  Fortunately her clinical condition has not changed.  If this is a graft infection it is overall fairly indolent.  We will discuss plans further at her next office visit.  Ruta Hinds, MD Vascular and Vein Specialists of [place] Office: [phone]

## 2020-08-13 ENCOUNTER — Ambulatory Visit: Payer: Medicare HMO | Admitting: Vascular Surgery

## 2020-08-17 ENCOUNTER — Ambulatory Visit (HOSPITAL_COMMUNITY)
Admission: RE | Admit: 2020-08-17 | Discharge: 2020-08-17 | Disposition: A | Discharge status: Home / Self Care | Payer: Medicare HMO | Source: Ambulatory Visit | Attending: Vascular Surgery | Admitting: Vascular Surgery

## 2020-08-17 ENCOUNTER — Other Ambulatory Visit: Payer: Self-pay

## 2020-08-17 DIAGNOSIS — I714 Abdominal aortic aneurysm, without rupture, unspecified: Secondary | ICD-10-CM

## 2020-08-18 ENCOUNTER — Other Ambulatory Visit (HOSPITAL_COMMUNITY): Payer: Self-pay | Admitting: Cardiovascular Disease

## 2020-08-18 DIAGNOSIS — I739 Peripheral vascular disease, unspecified: Secondary | ICD-10-CM

## 2020-08-18 DIAGNOSIS — Z95828 Presence of other vascular implants and grafts: Secondary | ICD-10-CM

## 2020-08-18 DIAGNOSIS — Z9889 Other specified postprocedural states: Secondary | ICD-10-CM

## 2020-08-18 DIAGNOSIS — I6523 Occlusion and stenosis of bilateral carotid arteries: Secondary | ICD-10-CM

## 2020-08-20 ENCOUNTER — Other Ambulatory Visit: Payer: Self-pay

## 2020-08-20 ENCOUNTER — Encounter: Payer: Self-pay | Admitting: Vascular Surgery

## 2020-08-20 ENCOUNTER — Ambulatory Visit: Payer: Medicare HMO | Admitting: Vascular Surgery

## 2020-08-20 VITALS — BP 152/74 | HR 50 | Temp 97.5°F | Resp 16 | Ht 59.0 in | Wt 103.0 lb

## 2020-08-20 DIAGNOSIS — T827XXD Infection and inflammatory reaction due to other cardiac and vascular devices, implants and grafts, subsequent encounter: Secondary | ICD-10-CM

## 2020-08-20 MED ORDER — DOXYCYCLINE HYCLATE 100 MG PO CAPS: 100.0000 mg | ORAL_CAPSULE | Freq: Two times a day (BID) | ORAL | 1 refills | Status: AC

## 2020-08-20 NOTE — Progress Notes (Signed)
Patient is a 75 year old female who returns for follow-up today.  She previously underwent aortobifemoral bypass grafting by Dr. Kellie Simmering approximately 10 years ago.  She had I&D of a left groin abscess by Dr. Donnetta Hutching in December 2021.  She recovered from all of these procedures.  Recently about 6 weeks ago she developed some swelling and irritation and pain in the left groin.  She had a small nodule in the left groin consistent with a possible superficial abscess.  This improved recently but has still had some occasional flareups.  She returns today after tagged white cell scan of her abdomen to evaluate for graft infection.  She also returns today with a CT scan of the abdomen and pelvis from [facility] dated June 18, 2020 for my review.  She does currently still smoke.  The patient has no fever chills nausea vomiting or overall malaise.  She states that the groin area has occasional soreness but no drainage.  She does have underlying fairly severe COPD.  She has chronic back pain.  She also has a history of hydronephrosis and has had intermittent urologic procedures for this.  I reviewed her previous cultures from December 2021 which grew out staph epidermidis on one culture but the other 2 cultures were negative.   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   Cerebrovascular occlusion 2001   Extracranial with Hx of TIA's   Depression    Hepatitis    Hyperlipidemia    IBS (irritable bowel syndrome)    Myocardial infarction [facility])    Peripheral vascular disease (HCC)    had Carotid En   Rheumatic fever    Stroke (HCC)    no residual- except left side of face has a small "twist"   Thyroid disease    Hypothyroidism    Past Surgical History:  Procedure Laterality Date   CAROTID ENDARTERECTOMY  11/25/1999   S/P Left   CAROTID ENDARTERECTOMY  03/24/2004   Left CCA stent- Dr. Amedeo Plenty   CAROTID STENT  2006   CHOLECYSTECTOMY     COLONOSCOPY  N/A 05/08/2013   Procedure: COLONOSCOPY;  Surgeon: Rogene Houston, MD;  [facility];  Service: Endoscopy;  Laterality: N/A;  230-moved to [place] notified pt   EYE SURGERY Bilateral    cataract removed lens implanted   PR VEIN BYPASS GRAFT,AORTO-FEM-POP  2001   WOUND DEBRIDEMENT Left 01/22/2020   Procedure: DEBRIDEMENT LEFT GROIN WOUND;  Surgeon: Rosetta Posner, MD;  Location: [facility] OR;  Service: Vascular;  Laterality: Left;    Current Outpatient Medications on File Prior to Visit  Medication Sig Dispense Refill   albuterol (VENTOLIN HFA) 108 (90 Base) MCG/ACT inhaler Inhale 2 puffs into the lungs every 6 (six) hours as needed for wheezing or shortness of breath.     apixaban (ELIQUIS) 5 MG TABS tablet Take 1 tablet (5 mg total) by mouth 2 (two) times daily. 60 tablet 11   calcium carbonate (OS-CAL - DOSED IN MG OF ELEMENTAL CALCIUM) 1250 (500 Ca) MG tablet Take 1 tablet by mouth daily.     carvedilol (COREG) 6.25 MG tablet Take 1 tablet (6.25 mg total) by mouth 2 (two) times daily. 60 tablet 11   citalopram (CELEXA) 20 MG tablet Take 20 mg by mouth daily.     cyanocobalamin 1000 MCG tablet Take 1,000 mcg by mouth daily.      dicyclomine (BENTYL) 20 MG tablet Take  20 mg by mouth in the morning, at noon, and at bedtime.      HYDROcodone-acetaminophen (NORCO/VICODIN) 5-325 MG tablet Take 1 tablet by mouth 4 (four) times daily as needed.     levothyroxine (SYNTHROID) 112 MCG tablet Take 112 mcg by mouth daily before breakfast.      Multiple Vitamin (MULTIVITAMIN) capsule Take 1 capsule by mouth daily.     naloxone (NARCAN) nasal spray 4 mg/0.1 mL Place 1 spray into the nose once.     rosuvastatin (CRESTOR) 20 MG tablet Take 20 mg by mouth daily.     tiZANidine (ZANAFLEX) 4 MG tablet Take 1 tablet by mouth as needed.     No current facility-administered medications on file prior to visit.    Social History   Socioeconomic History   Marital status: Married    Spouse name: Not on  file   Number of children: Not on file   Years of education: Not on file   Highest education level: Not on file  Occupational History   Not on file  Tobacco Use   Smoking status: Every Day    Packs/day: 0.33    Years: 63.00    Pack years: 20.79    Types: Cigarettes   Smokeless tobacco: Never   Tobacco comments:    1 pack every 3 days x 36yrs  Vaping Use   Vaping Use: Never used  Substance and Sexual Activity   Alcohol use: No   Drug use: No   Sexual activity: Not on file  Other Topics Concern   Not on file  Social History Narrative   Not on file   Social Determinants of Health   Financial Resource Strain: Not on file  Food Insecurity: Not on file  Transportation Needs: Not on file  Physical Activity: Not on file  Stress: Not on file  Social Connections: Not on file  Intimate Partner Violence: Not on file   Physical exam:  Vitals:   08/20/20 0907  BP: (!) 152/74  Pulse: (!) 50  Resp: 16  Temp: (!) 97.5 F (36.4 C)  SpO2: 97%  Weight: 103 lb (46.7 kg)  Height: 4\' 11"  (1.499 m)    Extremities: 2+ dorsalis pedis pulses bilaterally, small area of erythema at the apex of the left groin incision about 1 x 1 cm not fluctuant but small mass appreciable  Data: I reviewed the patient's tagged white cell scan which showed moderate uptake of white cells in the general vicinity of the proximal portion of the patient's aortobifemoral bypass graft.  There was no intense uptake in the left groin area.  Also reviewed her CT scan of the abdomen and pelvis which showed a 1.8 x 1.6 fluid collection over the distal left external iliac artery limb and a 15 mm x 1 to 2 cm area of inflammation around the proximal aspect of the aortobifemoral bypass graft just below the renal arteries by about 2 cm.  There was no gas surrounding the graft there was no rind or perigraft fluid to speak of.  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  I had a lengthy  discussion with the patient and her daughter today about possible options of treating this.  Her tagged white cell scan had moderate uptake again not a glaringly positive scan and no evidence of upper increased uptake in the left groin.  We discussed the options of removing the graft completely and reconstructing her or other options of ligating the aorta removing the graft  axillary bifemoral reconstruction all of which would be very big operations in a patient who may not tolerate this.  We did discuss probably a 5% mortality with the operation and probably several weeks of recovery time and months of rehab if she undergoes explant of the graft.  Since she currently is not exhibiting any symptoms I am inclined not to proceed with this.  She may have a low-grade graft infection but I believe the best option at this point would be antibiotic suppression lifetime.  Plan: Patient was scheduled for an appointment with infectious disease for consideration of which antibiotic to place her on long-term.  I called her in a prescription today for doxycycline 100 mg twice daily and will defer any change in his antibiotic to infectious disease.  The patient will follow up with [place] in 6 months time for repeat CT scan of the abdomen and pelvis.  Certainly we could repeat this early if she develops more clinical symptoms.  She also has a history of moderate carotid stenosis and we will schedule her for carotid duplex scan in 6 months as well.  She has some occasional dizziness symptoms and I do not believe these are related to her carotid disease.  Ruta Hinds, MD [facility] of [place] Office: [phone]

## 2020-08-21 ENCOUNTER — Other Ambulatory Visit: Payer: Self-pay

## 2020-08-21 DIAGNOSIS — I714 Abdominal aortic aneurysm, without rupture, unspecified: Secondary | ICD-10-CM

## 2020-08-26 ENCOUNTER — Ambulatory Visit (HOSPITAL_COMMUNITY)
Admission: RE | Admit: 2020-08-26 | Payer: Medicare HMO | Source: Ambulatory Visit | Attending: Cardiovascular Disease | Admitting: Cardiovascular Disease

## 2020-08-26 ENCOUNTER — Ambulatory Visit (HOSPITAL_COMMUNITY): Admission: RE | Admit: 2020-08-26 | Payer: Medicare HMO | Source: Ambulatory Visit

## 2020-09-10 ENCOUNTER — Other Ambulatory Visit: Payer: Self-pay

## 2020-09-10 ENCOUNTER — Encounter: Payer: Self-pay | Admitting: Family

## 2020-09-10 ENCOUNTER — Telehealth (INDEPENDENT_AMBULATORY_CARE_PROVIDER_SITE_OTHER): Payer: Medicare HMO | Admitting: Family

## 2020-09-10 ENCOUNTER — Other Ambulatory Visit (HOSPITAL_COMMUNITY): Payer: Self-pay

## 2020-09-10 DIAGNOSIS — T827XXD Infection and inflammatory reaction due to other cardiac and vascular devices, implants and grafts, subsequent encounter: Secondary | ICD-10-CM | POA: Diagnosis not present

## 2020-09-10 NOTE — Assessment & Plan Note (Addendum)
Ms. Boulter's nuclear medicine scan is concerning for infection. Vascular Surgery reviewed possible surgical intervention and is recommending lifelong suppression as the graft will remain in place. Unfortunately the MRSE is resistant to Bactrim and intermediate to doxycycline. Will need either ciprofloxacin, tedazolid or omadacyline. Pharmacy check prior authorizations and will have a response shortly. If unable to get tedazolid or omadacyline approved will go with ciprofloxacin. Plan for follow up 1 month after re-starting antibiotics.

## 2020-09-10 NOTE — Progress Notes (Signed)
Subjective:    Patient ID: [name], female    DOB: March 11, 1945, 75 y.o.   MRN: [id number]  Chief Complaint  Patient presents with   Vascular graft infection      Virtual Visit via Telephone/Video Note   I connected with [name] on 09/10/2020 at 4:15pm by telephone and verified that I am speaking with the correct person using two identifiers.   I discussed the limitations, risks, security and privacy concerns of performing an evaluation and management service by telephone and the availability of in person appointments. I also discussed with the patient that there may be a patient responsible charge related to this service. The patient expressed understanding and agreed to proceed.  Location:  Patient: Home Provider: Clinic   HPI:  [name] is a 75 y.o. female with previous history of vascular graft infection with cultures positive for MRSE s/p 6 weeks of daptomycin last seen on 02/20/20 presenting today at the request of Vascular Surgery with concern for infection. Had NM scan with medium size focus of increased uptake within the upper abdomen at the expected location of the proximal origin of the aortic bypass graft. Options of surgical intervention were discussed and it was decided to pursue lifelong antibiotic suppression.   Mr. Kattner was prescribed doxycycline which she continues to take. Denies any fevers or chills. Had a small lesion starting about 3 weeks ago. She is in agreement for long term antibiotics at this point.    Allergies  Allergen Reactions   Penicillins Swelling   Citalopram Other (See Comments)    nightmares   Morphine And Related Nausea And Vomiting   Losartan Nausea Only    Nausea and dizzy      Outpatient Medications Prior to Visit  Medication Sig Dispense Refill   albuterol (VENTOLIN HFA) 108 (90 Base) MCG/ACT inhaler Inhale 2 puffs into the lungs every 6 (six) hours as needed for wheezing or shortness of breath.      apixaban (ELIQUIS) 5 MG TABS tablet Take 1 tablet (5 mg total) by mouth 2 (two) times daily. 60 tablet 11   calcium carbonate (OS-CAL - DOSED IN MG OF ELEMENTAL CALCIUM) 1250 (500 Ca) MG tablet Take 1 tablet by mouth daily.     carvedilol (COREG) 6.25 MG tablet Take 1 tablet (6.25 mg total) by mouth 2 (two) times daily. 60 tablet 11   citalopram (CELEXA) 20 MG tablet Take 20 mg by mouth daily.     cyanocobalamin 1000 MCG tablet Take 1,000 mcg by mouth daily.      dicyclomine (BENTYL) 20 MG tablet Take 20 mg by mouth in the morning, at noon, and at bedtime.      doxycycline (VIBRAMYCIN) 100 MG capsule Take 1 capsule (100 mg total) by mouth 2 (two) times daily. 60 capsule 1   HYDROcodone-acetaminophen (NORCO/VICODIN) 5-325 MG tablet Take 1 tablet by mouth 4 (four) times daily as needed.     levothyroxine (SYNTHROID) 112 MCG tablet Take 112 mcg by mouth daily before breakfast.      Multiple Vitamin (MULTIVITAMIN) capsule Take 1 capsule by mouth daily.     naloxone (NARCAN) nasal spray 4 mg/0.1 mL Place 1 spray into the nose once.     rosuvastatin (CRESTOR) 20 MG tablet Take 20 mg by mouth daily.     tiZANidine (ZANAFLEX) 4 MG tablet Take 1 tablet by mouth as needed.     No facility-administered medications prior to visit.     Past  Medical History:  Diagnosis Date   Anxiety    denies   Carotid artery occlusion 05/08/2007   Cerebrovascular occlusion 2001   Extracranial with Hx of TIA's   Depression    Hepatitis    Hyperlipidemia    IBS (irritable bowel syndrome)    Myocardial infarction [facility])    Peripheral vascular disease (HCC)    had Carotid En   Rheumatic fever    Stroke (HCC)    no residual- except left side of face has a small "twist"   Thyroid disease    Hypothyroidism     Past Surgical History:  Procedure Laterality Date   CAROTID ENDARTERECTOMY  11/25/1999   S/P Left   CAROTID ENDARTERECTOMY  03/24/2004   Left CCA stent- Dr. Amedeo Plenty   CAROTID STENT  2006   CHOLECYSTECTOMY      COLONOSCOPY N/A 05/08/2013   Procedure: COLONOSCOPY;  Surgeon: Rogene Houston, MD;  [facility];  Service: Endoscopy;  Laterality: N/A;  230-moved to [place] notified pt   EYE SURGERY Bilateral    cataract removed lens implanted   PR VEIN BYPASS GRAFT,AORTO-FEM-POP  2001   WOUND DEBRIDEMENT Left 01/22/2020   Procedure: DEBRIDEMENT LEFT GROIN WOUND;  Surgeon: Rosetta Posner, MD;  Location: [facility] OR;  Service: Vascular;  Laterality: Left;       Review of Systems  Constitutional:  Negative for chills, diaphoresis, fatigue and fever.  Respiratory:  Negative for cough, chest tightness, shortness of breath and wheezing.   Cardiovascular:  Negative for chest pain.  Gastrointestinal:  Negative for abdominal pain, diarrhea, nausea and vomiting.     Objective:    Nursing note and vital signs reviewed.    [name] is pleasant to speak with and in no apparent distress. Physical exam is limited secondary to telehealth visit.   Assessment & Plan:   Problem List Items Addressed This Visit       Cardiovascular and Mediastinum   Vascular graft infection [facility])    [name]'s nuclear medicine scan is concerning for infection. Vascular Surgery reviewed possible surgical intervention and is recommending lifelong suppression as the graft will remain in place. Unfortunately the MRSE is resistant to Bactrim and intermediate to doxycycline. Will need either ciprofloxacin, tedazolid or omadacyline. Pharmacy check prior authorizations and will have a response shortly. If unable to get tedazolid or omadacyline approved will go with ciprofloxacin. Plan for follow up 1 month after re-starting antibiotics.          I am having [name] maintain her rosuvastatin, levothyroxine, dicyclomine, apixaban, carvedilol, multivitamin, cyanocobalamin, calcium carbonate, tiZANidine, naloxone, citalopram, albuterol, HYDROcodone-acetaminophen, and doxycycline.   I discussed the assessment and  treatment plan with the patient. The patient was provided an opportunity to ask questions and all were answered. The patient agreed with the plan and demonstrated an understanding of the instructions.   The patient was advised to call back or seek an in-person evaluation if the symptoms worsen or if the condition fails to improve as anticipated.   I provided 14  minutes of non-face-to-face time during this encounter.  Follow-up: 1 month after starting medication.    Terri Piedra, MSN, FNP-C Nurse Practitioner [facility] for Infectious Disease [place] number: [phone]

## 2020-09-11 ENCOUNTER — Telehealth: Payer: Self-pay

## 2020-09-11 ENCOUNTER — Other Ambulatory Visit (HOSPITAL_COMMUNITY): Payer: Self-pay

## 2020-09-11 NOTE — Telephone Encounter (Signed)
[facility] Patient Advocate Encounter   Received notification from [facility] that prior authorization for [name] is required.   PA submitted on 09/11/20 Key [id number] Status is pending    [facility] will continue to follow.   [name], [facility] Patient [facility] for Infectious Disease Phone: [phone] Fax:  [phone]

## 2020-09-14 ENCOUNTER — Telehealth: Payer: Self-pay

## 2020-09-14 ENCOUNTER — Other Ambulatory Visit (HOSPITAL_COMMUNITY): Payer: Self-pay

## 2020-09-14 NOTE — Telephone Encounter (Signed)
[facility] Patient Advocate Encounter  Prior Authorization for [name] has been approved.     Effective dates: 09/12/20 through 02/20/21  Patients co-pay is $9.85.   [facility] will continue to follow.  [name], [facility] Patient [facility] for Infectious Disease Phone: [phone] Fax:  [phone]

## 2020-09-23 ENCOUNTER — Telehealth: Payer: Self-pay

## 2020-09-23 NOTE — Telephone Encounter (Signed)
Received call today from [facility] with Vascular and Vein regarding patient medication. Patient is scheduled to see them on Friday 8/5. Would like to know what medication is currently prescribed. Patient is  not sure what she is taking and not able to answer their questions. Per last note provider was going to try either Cipro, Tedazolid, or omadacyline.  PA for Omadacycline is approved. Waiting on orders from provider.  Will forward message to [place], [place].  Leatrice Jewels, RMA

## 2020-09-23 NOTE — Telephone Encounter (Signed)
Patient calls today to report that a lump has again formed on her left groin and has been present since Sunday. Says it is about the size of a silver dollar and is red, uncomfortable and some white pimples are forming on the area. Denies chills or fever. She had an aortbifem by Dr. Kellie Simmering about a decade ago and had an I&D of the left groin by Dr. Donnetta Hutching in February 2022 due to a graft infection. She was last seen in clinic on 08/20/2020 by Dr. Oneida Alar due to reoccurrence of symptoms. There was discussion of explantation of the graft versus lifelong antibiotics. She is very concerned about having the surgery. [facility] sent in a prescription of Doxycycline and patient had appt with infectious disease to determine further antibiotic treatment. Per the ID notes, there is an approval for the drug Nuzyra. I have called the ID clinic, and they are just waiting for the MD to place the RX order. They do not need further labs and should be able to order RX. I advised patient that ID would call when the order was placed. I discussed her symptoms with Dr. Donnetta Hutching - due to patient's ongoing need for treatment - placing patient on Dr. Claretha Cooper schedule for evaluation. Patient is sad about [facility] and TE retirement and aware she is being seen by a new provider.

## 2020-09-25 ENCOUNTER — Encounter: Payer: Self-pay | Admitting: Vascular Surgery

## 2020-09-25 ENCOUNTER — Other Ambulatory Visit: Payer: Self-pay

## 2020-09-25 ENCOUNTER — Ambulatory Visit (INDEPENDENT_AMBULATORY_CARE_PROVIDER_SITE_OTHER): Payer: Medicare HMO | Admitting: Vascular Surgery

## 2020-09-25 ENCOUNTER — Other Ambulatory Visit (HOSPITAL_COMMUNITY): Payer: Self-pay

## 2020-09-25 ENCOUNTER — Other Ambulatory Visit: Payer: Self-pay | Admitting: Pharmacist

## 2020-09-25 VITALS — BP 178/74 | HR 52 | Temp 97.9°F | Resp 20 | Ht 59.0 in | Wt 103.0 lb

## 2020-09-25 DIAGNOSIS — T827XXD Infection and inflammatory reaction due to other cardiac and vascular devices, implants and grafts, subsequent encounter: Secondary | ICD-10-CM

## 2020-09-25 DIAGNOSIS — I714 Abdominal aortic aneurysm, without rupture, unspecified: Secondary | ICD-10-CM

## 2020-09-25 MED ORDER — OMADACYCLINE TOSYLATE 150 MG PO TABS
ORAL_TABLET | ORAL | 2 refills | Status: AC
  Filled 2020-09-25: qty 60, 29d supply, fill #0

## 2020-09-25 NOTE — Progress Notes (Signed)
Patient ID: [name], female   DOB: 22-Feb-1946, 75 y.o.   MRN: [id number]  Reason for Consult: Wound Check   Referred by Jolinda Croak, MD  Subjective:     HPI:  [name] is a 75 y.o. female with a history of aortobifemoral bypass grafting approximately 10 years ago.  More recently [name] had an abscess develop in her left groin in December 2021 and [name] had this I&D needing closed.  [name] was recently seen in our office with left groin pain that was recurrent.  [name] was found to have a superficial abscess.  [name] underwent initially CT scan in April in [place] that was reviewed but I do not have for review today.  [name] then underwent tagged white blood cell scan which was not impressive.  [name] was referred to infectious disease with plans for starting antibiotics but these have not been started.  [name] has had progressive swelling of the groin until yesterday when [name] began having yellowish drainage.  [name] has not had any erythema.  [name] denies fevers or chills.  [name] remains at her usual level of activity. [name] is taking eliquis.   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   Cerebrovascular occlusion 2001   Extracranial with Hx of TIA's   Depression    Hepatitis    Hyperlipidemia    IBS (irritable bowel syndrome)    Myocardial infarction ([place])    Peripheral vascular disease (HCC)    had Carotid En   Rheumatic fever    Stroke (HCC)    no residual- except left side of face has a small "twist"   Thyroid disease    Hypothyroidism   Family History  Problem Relation Age of Onset   Cancer Sister    Hypertension Father    Heart attack Mother    Hyperlipidemia Mother    Kidney disease Brother    Cancer Brother    Cancer Brother        Lung    Colon cancer Neg Hx    Past Surgical History:  Procedure Laterality Date   CAROTID ENDARTERECTOMY  11/25/1999   S/P Left   CAROTID ENDARTERECTOMY  03/24/2004   Left CCA stent- Dr.  Amedeo Plenty   CAROTID STENT  2006   CHOLECYSTECTOMY     COLONOSCOPY N/A 05/08/2013   Procedure: COLONOSCOPY;  Surgeon: Rogene Houston, MD;  [facility];  Service: Endoscopy;  Laterality: N/A;  230-moved to [place] notified pt   EYE SURGERY Bilateral    cataract removed lens implanted   PR VEIN BYPASS GRAFT,AORTO-FEM-POP  2001   WOUND DEBRIDEMENT Left 01/22/2020   Procedure: DEBRIDEMENT LEFT GROIN WOUND;  Surgeon: Rosetta Posner, MD;  Location: [facility];  Service: Vascular;  Laterality: Left;    Short Social History:  Social History   Tobacco Use   Smoking status: Every Day    Packs/day: 0.33    Years: 63.00    Pack years: 20.79    Types: Cigarettes   Smokeless tobacco: Never   Tobacco comments:    1 pack every 3 days x 49yr  Substance Use Topics   Alcohol use: No    Allergies  Allergen Reactions   Penicillins Swelling   Citalopram Other (See Comments)    nightmares   Morphine And Related Nausea And Vomiting   Losartan Nausea Only    Nausea and dizzy    Current  Outpatient Medications  Medication Sig Dispense Refill   albuterol (VENTOLIN HFA) 108 (90 Base) MCG/ACT inhaler Inhale 2 puffs into the lungs every 6 (six) hours as needed for wheezing or shortness of breath.     apixaban (ELIQUIS) 5 MG TABS tablet Take 1 tablet (5 mg total) by mouth 2 (two) times daily. 60 tablet 11   calcium carbonate (OS-CAL - DOSED IN MG OF ELEMENTAL CALCIUM) 1250 (500 Ca) MG tablet Take 1 tablet by mouth daily.     carvedilol (COREG) 6.25 MG tablet Take 1 tablet (6.25 mg total) by mouth 2 (two) times daily. 60 tablet 11   citalopram (CELEXA) 20 MG tablet Take 20 mg by mouth daily.     cyanocobalamin 1000 MCG tablet Take 1,000 mcg by mouth daily.      dicyclomine (BENTYL) 20 MG tablet Take 20 mg by mouth in the morning, at noon, and at bedtime.      doxycycline (VIBRAMYCIN) 100 MG capsule Take 1 capsule (100 mg total) by mouth 2 (two) times daily. 60 capsule 1   HYDROcodone-acetaminophen  (NORCO/VICODIN) 5-325 MG tablet Take 1 tablet by mouth 4 (four) times daily as needed.     levothyroxine (SYNTHROID) 112 MCG tablet Take 112 mcg by mouth daily before breakfast.      Multiple Vitamin (MULTIVITAMIN) capsule Take 1 capsule by mouth daily.     naloxone (NARCAN) nasal spray 4 mg/0.1 mL Place 1 spray into the nose once.     rosuvastatin (CRESTOR) 20 MG tablet Take 20 mg by mouth daily.     tiZANidine (ZANAFLEX) 4 MG tablet Take 1 tablet by mouth as needed.     No current facility-administered medications for this visit.    Review of Systems  Constitutional:  Constitutional negative. HENT: HENT negative.  Eyes: Eyes negative.  Cardiovascular: Cardiovascular negative.  GI: Gastrointestinal negative.  Musculoskeletal: Musculoskeletal negative.  Skin: Positive for wound.  Neurological: Neurological negative. Hematologic: Hematologic/lymphatic negative.  Psychiatric: Psychiatric negative.       Objective:  Objective   Physical Exam HENT:     Head: Normocephalic.     Nose:     Comments: Wearing a mask Eyes:     Pupils: Pupils are equal, round, and reactive to light.  Cardiovascular:     Pulses:          Femoral pulses are 2+ on the right side and 2+ on the left side. Pulmonary:     Effort: Pulmonary effort is normal.  Abdominal:     General: Abdomen is flat.     Palpations: Abdomen is soft.  Skin:    Comments: Left groin has a punctate hole with minimal expressible drainage that is nonpurulent.  There is no surrounding erythema  Neurological:     General: No focal deficit present.     Mental Status: [name] is alert.  Psychiatric:        Mood and Affect: Mood normal.        Behavior: Behavior normal.        Thought Content: Thought content normal.        Judgment: Judgment normal.    Data: Tagged WBC scan IMPRESSION: 1. Medium size focus of increased uptake within the upper abdomen at the expected location of the proximal origin of the aortic bypass graft.  Recommend correlation with CT of the abdomen pelvis with contrast material. 2. No additional abnormal areas of radiotracer localization.     Assessment/Plan:     75 year old female with the  above-noted issues with previous aortobifemoral bypass graft and subsequent sinus drainage from the left groin that was debrided and closed.  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  I reviewed her tagged white cell scan which is unremarkable.  Given that her scan was over 3 months ago I think we should repeat this and also get her back with infectious disease to initiate suppressive antibiotic therapy.  Patient is hopeful to avoid any large operations but at this time [name] is going to need at least a small operation.  I discussed with her we will get CT scan and plan from there.  With any luck this is confined to the groin and we can plan for debridement with possible muscle flap closure and wound VAC therapy.  [name] demonstrates good understanding.  I will get her back in 1 to 2 weeks with CT scan and hopefully [name] can see ID in that amount of time to get started on antibiotics.  I discussed with her that should [name] get systemically ill with fevers and chills or have worsened drainage or erythema that [name] needs to be evaluated in the hospital where we can get CT a of her abdomen and pelvis to evaluate the graft and also plan for the above-noted debridement.  Her and her husband demonstrate good understanding.     Waynetta Sandy MD [facility]

## 2020-09-25 NOTE — Progress Notes (Signed)
Sending omadacycline Rx to patient's pharmacy

## 2020-09-28 ENCOUNTER — Telehealth: Payer: Self-pay | Admitting: Pharmacist

## 2020-09-28 ENCOUNTER — Other Ambulatory Visit (HOSPITAL_COMMUNITY): Payer: Self-pay

## 2020-09-28 NOTE — Telephone Encounter (Signed)
Counseled patient to take THREE omadacycline tablets (450 mg) together once daily x 2 days then TWO tablets (300 mg) by mouth once daily thereafter. Counseled patient that medication needs to be taken on an empty stomach and with a full glass of water. Also counseled that it must be taken after at least 4 hours of fasting and to wait at least 2 more hours after taking to eat any food. Counseled to avoid milk or antacids for 4 hours after taking as well. She will take it when she first wakes up and wait 2 ours to eat breakfast. She does take vitamins, so she will move them to her lunch time doses.  Advised patient that common side effects include nausea and vomiting and to let us know if this is a problem. Advised patient to stay out of the sun for extended periods of time and to wear sunscreen and cover their face and skin as omadacycline can cause photosensitivity. Encouraged patient not to miss any doses and to continue taking until discontinued. Counseled patient on what to do if dose is missed - if it is closer to the missed dose take immediately; if closer to next dose skip dose and take the next dose at the usual time. Patient will call me if any issues arise.  She did not have any follow up set with [name]. Made appointment for Tuesday 9/13 at 11:15am.

## 2020-09-29 ENCOUNTER — Emergency Department (HOSPITAL_COMMUNITY): Payer: Medicare HMO | Admitting: Certified Registered"

## 2020-09-29 ENCOUNTER — Encounter (HOSPITAL_COMMUNITY): Payer: Self-pay

## 2020-09-29 ENCOUNTER — Inpatient Hospital Stay (HOSPITAL_COMMUNITY)
Admission: EM | Admit: 2020-09-29 | Discharge: 2020-10-22 | DRG: 252 | Disposition: E | Payer: Medicare HMO | Attending: Vascular Surgery | Admitting: Vascular Surgery

## 2020-09-29 ENCOUNTER — Emergency Department (HOSPITAL_COMMUNITY): Payer: Medicare HMO

## 2020-09-29 ENCOUNTER — Encounter (HOSPITAL_COMMUNITY): Admission: EM | Disposition: E | Payer: Self-pay | Source: Home / Self Care | Attending: Vascular Surgery

## 2020-09-29 ENCOUNTER — Telehealth: Payer: Self-pay | Admitting: Cardiovascular Disease

## 2020-09-29 ENCOUNTER — Inpatient Hospital Stay (HOSPITAL_COMMUNITY): Payer: Medicare HMO

## 2020-09-29 DIAGNOSIS — Z515 Encounter for palliative care: Secondary | ICD-10-CM | POA: Diagnosis not present

## 2020-09-29 DIAGNOSIS — R571 Hypovolemic shock: Secondary | ICD-10-CM | POA: Diagnosis not present

## 2020-09-29 DIAGNOSIS — Z66 Do not resuscitate: Secondary | ICD-10-CM | POA: Diagnosis present

## 2020-09-29 DIAGNOSIS — Y92239 Unspecified place in hospital as the place of occurrence of the external cause: Secondary | ICD-10-CM | POA: Diagnosis not present

## 2020-09-29 DIAGNOSIS — N990 Postprocedural (acute) (chronic) kidney failure: Secondary | ICD-10-CM | POA: Diagnosis present

## 2020-09-29 DIAGNOSIS — Z978 Presence of other specified devices: Secondary | ICD-10-CM | POA: Diagnosis present

## 2020-09-29 DIAGNOSIS — Y828 Other medical devices associated with adverse incidents: Secondary | ICD-10-CM | POA: Diagnosis not present

## 2020-09-29 DIAGNOSIS — K7200 Acute and subacute hepatic failure without coma: Secondary | ICD-10-CM | POA: Diagnosis not present

## 2020-09-29 DIAGNOSIS — K660 Peritoneal adhesions (postprocedural) (postinfection): Secondary | ICD-10-CM | POA: Diagnosis present

## 2020-09-29 DIAGNOSIS — G9341 Metabolic encephalopathy: Secondary | ICD-10-CM | POA: Diagnosis not present

## 2020-09-29 DIAGNOSIS — Z452 Encounter for adjustment and management of vascular access device: Secondary | ICD-10-CM

## 2020-09-29 DIAGNOSIS — T82392A Other mechanical complication of femoral arterial graft (bypass), initial encounter: Secondary | ICD-10-CM

## 2020-09-29 DIAGNOSIS — R54 Age-related physical debility: Secondary | ICD-10-CM | POA: Diagnosis not present

## 2020-09-29 DIAGNOSIS — J189 Pneumonia, unspecified organism: Secondary | ICD-10-CM | POA: Diagnosis not present

## 2020-09-29 DIAGNOSIS — T827XXA Infection and inflammatory reaction due to other cardiac and vascular devices, implants and grafts, initial encounter: Secondary | ICD-10-CM | POA: Diagnosis not present

## 2020-09-29 DIAGNOSIS — I252 Old myocardial infarction: Secondary | ICD-10-CM

## 2020-09-29 DIAGNOSIS — Z880 Allergy status to penicillin: Secondary | ICD-10-CM

## 2020-09-29 DIAGNOSIS — Y95 Nosocomial condition: Secondary | ICD-10-CM | POA: Clinically undetermined

## 2020-09-29 DIAGNOSIS — E875 Hyperkalemia: Secondary | ICD-10-CM | POA: Diagnosis not present

## 2020-09-29 DIAGNOSIS — S3710XA Unspecified injury of ureter, initial encounter: Secondary | ICD-10-CM | POA: Diagnosis present

## 2020-09-29 DIAGNOSIS — F17210 Nicotine dependence, cigarettes, uncomplicated: Secondary | ICD-10-CM | POA: Diagnosis present

## 2020-09-29 DIAGNOSIS — Y838 Other surgical procedures as the cause of abnormal reaction of the patient, or of later complication, without mention of misadventure at the time of the procedure: Secondary | ICD-10-CM | POA: Diagnosis not present

## 2020-09-29 DIAGNOSIS — D696 Thrombocytopenia, unspecified: Secondary | ICD-10-CM | POA: Diagnosis not present

## 2020-09-29 DIAGNOSIS — I129 Hypertensive chronic kidney disease with stage 1 through stage 4 chronic kidney disease, or unspecified chronic kidney disease: Secondary | ICD-10-CM | POA: Diagnosis present

## 2020-09-29 DIAGNOSIS — T827XXD Infection and inflammatory reaction due to other cardiac and vascular devices, implants and grafts, subsequent encounter: Secondary | ICD-10-CM | POA: Diagnosis not present

## 2020-09-29 DIAGNOSIS — R579 Shock, unspecified: Secondary | ICD-10-CM | POA: Diagnosis not present

## 2020-09-29 DIAGNOSIS — J440 Chronic obstructive pulmonary disease with acute lower respiratory infection: Secondary | ICD-10-CM | POA: Diagnosis present

## 2020-09-29 DIAGNOSIS — R7401 Elevation of levels of liver transaminase levels: Secondary | ICD-10-CM

## 2020-09-29 DIAGNOSIS — Z20822 Contact with and (suspected) exposure to covid-19: Secondary | ICD-10-CM | POA: Diagnosis not present

## 2020-09-29 DIAGNOSIS — Z9289 Personal history of other medical treatment: Secondary | ICD-10-CM

## 2020-09-29 DIAGNOSIS — Z7951 Long term (current) use of inhaled steroids: Secondary | ICD-10-CM

## 2020-09-29 DIAGNOSIS — J9601 Acute respiratory failure with hypoxia: Secondary | ICD-10-CM | POA: Diagnosis not present

## 2020-09-29 DIAGNOSIS — Z8673 Personal history of transient ischemic attack (TIA), and cerebral infarction without residual deficits: Secondary | ICD-10-CM

## 2020-09-29 DIAGNOSIS — E039 Hypothyroidism, unspecified: Secondary | ICD-10-CM | POA: Diagnosis present

## 2020-09-29 DIAGNOSIS — B49 Unspecified mycosis: Secondary | ICD-10-CM | POA: Diagnosis not present

## 2020-09-29 DIAGNOSIS — I10 Essential (primary) hypertension: Secondary | ICD-10-CM | POA: Diagnosis present

## 2020-09-29 DIAGNOSIS — T82312A Breakdown (mechanical) of femoral arterial graft (bypass), initial encounter: Secondary | ICD-10-CM | POA: Diagnosis not present

## 2020-09-29 DIAGNOSIS — D62 Acute posthemorrhagic anemia: Secondary | ICD-10-CM | POA: Diagnosis not present

## 2020-09-29 DIAGNOSIS — S36400A Unspecified injury of duodenum, initial encounter: Secondary | ICD-10-CM | POA: Diagnosis present

## 2020-09-29 DIAGNOSIS — Z841 Family history of disorders of kidney and ureter: Secondary | ICD-10-CM

## 2020-09-29 DIAGNOSIS — I480 Paroxysmal atrial fibrillation: Secondary | ICD-10-CM | POA: Diagnosis present

## 2020-09-29 DIAGNOSIS — Z888 Allergy status to other drugs, medicaments and biological substances status: Secondary | ICD-10-CM

## 2020-09-29 DIAGNOSIS — R58 Hemorrhage, not elsewhere classified: Secondary | ICD-10-CM | POA: Diagnosis present

## 2020-09-29 DIAGNOSIS — I724 Aneurysm of artery of lower extremity: Secondary | ICD-10-CM | POA: Diagnosis present

## 2020-09-29 DIAGNOSIS — Z7901 Long term (current) use of anticoagulants: Secondary | ICD-10-CM

## 2020-09-29 DIAGNOSIS — E785 Hyperlipidemia, unspecified: Secondary | ICD-10-CM | POA: Diagnosis present

## 2020-09-29 DIAGNOSIS — R739 Hyperglycemia, unspecified: Secondary | ICD-10-CM | POA: Diagnosis not present

## 2020-09-29 DIAGNOSIS — J449 Chronic obstructive pulmonary disease, unspecified: Secondary | ICD-10-CM

## 2020-09-29 DIAGNOSIS — E872 Acidosis: Secondary | ICD-10-CM | POA: Diagnosis not present

## 2020-09-29 DIAGNOSIS — Z5189 Encounter for other specified aftercare: Secondary | ICD-10-CM

## 2020-09-29 DIAGNOSIS — R7303 Prediabetes: Secondary | ICD-10-CM | POA: Diagnosis present

## 2020-09-29 DIAGNOSIS — I739 Peripheral vascular disease, unspecified: Secondary | ICD-10-CM | POA: Diagnosis present

## 2020-09-29 DIAGNOSIS — N189 Chronic kidney disease, unspecified: Secondary | ICD-10-CM | POA: Diagnosis present

## 2020-09-29 DIAGNOSIS — Z7189 Other specified counseling: Secondary | ICD-10-CM | POA: Diagnosis not present

## 2020-09-29 DIAGNOSIS — Z79899 Other long term (current) drug therapy: Secondary | ICD-10-CM

## 2020-09-29 DIAGNOSIS — T827XXS Infection and inflammatory reaction due to other cardiac and vascular devices, implants and grafts, sequela: Secondary | ICD-10-CM | POA: Diagnosis not present

## 2020-09-29 DIAGNOSIS — Z8249 Family history of ischemic heart disease and other diseases of the circulatory system: Secondary | ICD-10-CM

## 2020-09-29 DIAGNOSIS — N170 Acute kidney failure with tubular necrosis: Secondary | ICD-10-CM | POA: Diagnosis not present

## 2020-09-29 DIAGNOSIS — I428 Other cardiomyopathies: Secondary | ICD-10-CM | POA: Diagnosis not present

## 2020-09-29 DIAGNOSIS — Y832 Surgical operation with anastomosis, bypass or graft as the cause of abnormal reaction of the patient, or of later complication, without mention of misadventure at the time of the procedure: Secondary | ICD-10-CM | POA: Diagnosis present

## 2020-09-29 DIAGNOSIS — Z83438 Family history of other disorder of lipoprotein metabolism and other lipidemia: Secondary | ICD-10-CM

## 2020-09-29 DIAGNOSIS — Z79890 Hormone replacement therapy (postmenopausal): Secondary | ICD-10-CM

## 2020-09-29 DIAGNOSIS — Z01818 Encounter for other preprocedural examination: Secondary | ICD-10-CM

## 2020-09-29 DIAGNOSIS — Z9841 Cataract extraction status, right eye: Secondary | ICD-10-CM

## 2020-09-29 DIAGNOSIS — Z9911 Dependence on respirator [ventilator] status: Secondary | ICD-10-CM | POA: Diagnosis not present

## 2020-09-29 DIAGNOSIS — N179 Acute kidney failure, unspecified: Secondary | ICD-10-CM | POA: Diagnosis not present

## 2020-09-29 DIAGNOSIS — D72828 Other elevated white blood cell count: Secondary | ICD-10-CM | POA: Diagnosis not present

## 2020-09-29 DIAGNOSIS — Z419 Encounter for procedure for purposes other than remedying health state, unspecified: Secondary | ICD-10-CM

## 2020-09-29 DIAGNOSIS — Z885 Allergy status to narcotic agent status: Secondary | ICD-10-CM

## 2020-09-29 DIAGNOSIS — Z9842 Cataract extraction status, left eye: Secondary | ICD-10-CM

## 2020-09-29 HISTORY — PX: INCISION AND DRAINAGE OF WOUND: SHX1803

## 2020-09-29 HISTORY — PX: PATCH ANGIOPLASTY: SHX6230

## 2020-09-29 LAB — POCT I-STAT 7, (LYTES, BLD GAS, ICA,H+H)
Acid-Base Excess: 5 mmol/L — ABNORMAL HIGH (ref 0.0–2.0)
Bicarbonate: 30.1 mmol/L — ABNORMAL HIGH (ref 20.0–28.0)
Calcium, Ion: 1.21 mmol/L (ref 1.15–1.40)
HCT: 33 % — ABNORMAL LOW (ref 36.0–46.0)
Hemoglobin: 11.2 g/dL — ABNORMAL LOW (ref 12.0–15.0)
O2 Saturation: 100 %
Potassium: 4.4 mmol/L (ref 3.5–5.1)
Sodium: 136 mmol/L (ref 135–145)
TCO2: 32 mmol/L (ref 22–32)
pCO2 arterial: 45.9 mmHg (ref 32.0–48.0)
pH, Arterial: 7.425 (ref 7.350–7.450)
pO2, Arterial: 253 mmHg — ABNORMAL HIGH (ref 83.0–108.0)

## 2020-09-29 LAB — BASIC METABOLIC PANEL
Anion gap: 9 (ref 5–15)
BUN: 20 mg/dL (ref 8–23)
CO2: 29 mmol/L (ref 22–32)
Calcium: 9.3 mg/dL (ref 8.9–10.3)
Chloride: 97 mmol/L — ABNORMAL LOW (ref 98–111)
Creatinine, Ser: 0.92 mg/dL (ref 0.44–1.00)
GFR, Estimated: >60 mL/min (ref >60)
Glucose, Bld: 89 mg/dL (ref 70–99)
Potassium: 4.4 mmol/L (ref 3.5–5.1)
Sodium: 135 mmol/L (ref 135–145)

## 2020-09-29 LAB — CBC WITH DIFFERENTIAL/PLATELET
Abs Immature Granulocytes: 0.02 10*3/uL (ref 0.00–0.07)
Basophils Absolute: 0.1 10*3/uL (ref 0.0–0.1)
Basophils Relative: 1 %
Eosinophils Absolute: 0.4 10*3/uL (ref 0.0–0.5)
Eosinophils Relative: 5 %
HCT: 34.6 % — ABNORMAL LOW (ref 36.0–46.0)
Hemoglobin: 10.6 g/dL — ABNORMAL LOW (ref 12.0–15.0)
Immature Granulocytes: 0 %
Lymphocytes Relative: 20 %
Lymphs Abs: 1.7 10*3/uL (ref 0.7–4.0)
MCH: 26.6 pg (ref 26.0–34.0)
MCHC: 30.6 g/dL (ref 30.0–36.0)
MCV: 86.9 fL (ref 80.0–100.0)
Monocytes Absolute: 0.8 10*3/uL (ref 0.1–1.0)
Monocytes Relative: 9 %
Neutro Abs: 5.6 10*3/uL (ref 1.7–7.7)
Neutrophils Relative %: 65 %
Platelets: 243 10*3/uL (ref 150–400)
RBC: 3.98 MIL/uL (ref 3.87–5.11)
RDW: 14.7 % (ref 11.5–15.5)
WBC: 8.5 10*3/uL (ref 4.0–10.5)
nRBC: 0 % (ref 0.0–0.2)

## 2020-09-29 LAB — SARS CORONAVIRUS 2 (TAT 6-24 HRS): SARS Coronavirus 2: NEGATIVE

## 2020-09-29 LAB — PREPARE RBC (CROSSMATCH)

## 2020-09-29 LAB — ABO/RH: ABO/RH(D): O POS

## 2020-09-29 SURGERY — IRRIGATION AND DEBRIDEMENT WOUND
Anesthesia: General | Site: Groin | Laterality: Left

## 2020-09-29 MED ORDER — ROCURONIUM BROMIDE 100 MG/10ML IV SOLN
INTRAVENOUS | Status: DC | PRN
  Administered 2020-09-29: 60 mg via INTRAVENOUS
  Administered 2020-09-29: 40 mg via INTRAVENOUS

## 2020-09-29 MED ORDER — SODIUM CHLORIDE 0.9% FLUSH
10.0000 mL | Freq: Two times a day (BID) | INTRAVENOUS | Status: DC
Start: 2020-09-29 — End: 2020-10-08
  Administered 2020-09-29 – 2020-10-08 (×15): 10 mL

## 2020-09-29 MED ORDER — ACETAMINOPHEN 325 MG PO TABS: 325.0000 mg | ORAL_TABLET | ORAL | Status: DC | PRN

## 2020-09-29 MED ORDER — TIZANIDINE HCL 4 MG PO TABS: 4.0000 mg | ORAL_TABLET | ORAL | Status: DC | PRN

## 2020-09-29 MED ORDER — SODIUM CHLORIDE 0.9 % IV SOLN: INTRAVENOUS | Status: DC

## 2020-09-29 MED ORDER — CHLORHEXIDINE GLUCONATE CLOTH 2 % EX PADS: 6.0000 | MEDICATED_PAD | Freq: Every day | CUTANEOUS | Status: DC

## 2020-09-29 MED ORDER — FENTANYL CITRATE (PF) 100 MCG/2ML IJ SOLN
INTRAMUSCULAR | Status: DC | PRN
  Administered 2020-09-29 (×3): 50 ug via INTRAVENOUS
  Administered 2020-09-29: 100 ug via INTRAVENOUS

## 2020-09-29 MED ORDER — SODIUM CHLORIDE 0.9% IV SOLUTION: Freq: Once | INTRAVENOUS | Status: DC

## 2020-09-29 MED ORDER — IOHEXOL 350 MG/ML SOLN
100.0000 mL | Freq: Once | INTRAVENOUS | Status: AC | PRN
  Administered 2020-09-29: 100 mL via INTRAVENOUS

## 2020-09-29 MED ORDER — PROPOFOL 10 MG/ML IV BOLUS
INTRAVENOUS | Status: AC
  Filled 2020-09-29: qty 20

## 2020-09-29 MED ORDER — CITALOPRAM HYDROBROMIDE 20 MG PO TABS
20.0000 mg | ORAL_TABLET | Freq: Every day | ORAL | Status: DC
  Administered 2020-09-30 – 2020-10-01 (×2): 20 mg via ORAL
  Filled 2020-09-29 (×2): qty 1

## 2020-09-29 MED ORDER — SODIUM CHLORIDE 0.9% FLUSH: 10.0000 mL | INTRAVENOUS | Status: DC | PRN

## 2020-09-29 MED ORDER — SODIUM CHLORIDE 0.9 % IV SOLN
500.0000 mL | Freq: Once | INTRAVENOUS | Status: AC | PRN
Start: 2020-09-29 — End: 2020-10-01
  Administered 2020-10-01: 500 mL via INTRAVENOUS

## 2020-09-29 MED ORDER — VASOPRESSIN 20 UNIT/ML IV SOLN
INTRAVENOUS | Status: AC
  Filled 2020-09-29: qty 1

## 2020-09-29 MED ORDER — LEVOTHYROXINE SODIUM 112 MCG PO TABS
112.0000 ug | ORAL_TABLET | Freq: Every day | ORAL | Status: DC
  Administered 2020-09-30 – 2020-10-01 (×2): 112 ug via ORAL
  Filled 2020-09-29 (×5): qty 1

## 2020-09-29 MED ORDER — MULTIVITAMINS PO CAPS: 1.0000 | ORAL_CAPSULE | Freq: Every day | ORAL | Status: DC

## 2020-09-29 MED ORDER — PROTAMINE SULFATE 10 MG/ML IV SOLN
INTRAVENOUS | Status: DC | PRN
  Administered 2020-09-29: 40 mg via INTRAVENOUS
  Administered 2020-09-29: 10 mg via INTRAVENOUS

## 2020-09-29 MED ORDER — VITAMIN B-12 1000 MCG PO TABS
1000.0000 ug | ORAL_TABLET | Freq: Every day | ORAL | Status: DC
  Administered 2020-09-30 – 2020-10-01 (×2): 1000 ug via ORAL
  Filled 2020-09-29 (×2): qty 1

## 2020-09-29 MED ORDER — ORAL CARE MOUTH RINSE
15.0000 mL | Freq: Two times a day (BID) | OROMUCOSAL | Status: DC
  Administered 2020-09-29 – 2020-10-01 (×5): 15 mL via OROMUCOSAL

## 2020-09-29 MED ORDER — CHLORHEXIDINE GLUCONATE CLOTH 2 % EX PADS
6.0000 | MEDICATED_PAD | Freq: Every day | CUTANEOUS | Status: DC
  Administered 2020-09-30 – 2020-10-07 (×9): 6 via TOPICAL

## 2020-09-29 MED ORDER — ASPIRIN EC 81 MG PO TBEC
81.0000 mg | DELAYED_RELEASE_TABLET | Freq: Every day | ORAL | Status: DC
  Administered 2020-10-01 – 2020-10-02 (×2): 81 mg via ORAL
  Filled 2020-09-29 (×3): qty 1

## 2020-09-29 MED ORDER — POLYETHYLENE GLYCOL 3350 17 G PO PACK: 17.0000 g | PACK | Freq: Every day | ORAL | Status: DC | PRN

## 2020-09-29 MED ORDER — CHLORHEXIDINE GLUCONATE 0.12 % MT SOLN: 15.0000 mL | Freq: Once | OROMUCOSAL | Status: AC

## 2020-09-29 MED ORDER — PHENYLEPHRINE HCL-NACL 20-0.9 MG/250ML-% IV SOLN
INTRAVENOUS | Status: DC | PRN
  Administered 2020-09-29: 50 ug/min via INTRAVENOUS

## 2020-09-29 MED ORDER — PHENYLEPHRINE HCL (PRESSORS) 10 MG/ML IV SOLN
INTRAVENOUS | Status: DC | PRN
  Administered 2020-09-29: 80 ug via INTRAVENOUS

## 2020-09-29 MED ORDER — PANTOPRAZOLE SODIUM 40 MG PO TBEC
40.0000 mg | DELAYED_RELEASE_TABLET | Freq: Every day | ORAL | Status: DC
  Administered 2020-09-30 – 2020-10-01 (×2): 40 mg via ORAL
  Filled 2020-09-29 (×2): qty 1

## 2020-09-29 MED ORDER — LACTATED RINGERS IV SOLN
INTRAVENOUS | Status: DC | PRN
Start: 2020-09-29 — End: 2020-09-29

## 2020-09-29 MED ORDER — LABETALOL HCL 5 MG/ML IV SOLN: 10.0000 mg | INTRAVENOUS | Status: DC | PRN

## 2020-09-29 MED ORDER — VANCOMYCIN HCL IN DEXTROSE 1-5 GM/200ML-% IV SOLN
INTRAVENOUS | Status: AC
  Filled 2020-09-29: qty 200

## 2020-09-29 MED ORDER — FENTANYL CITRATE (PF) 250 MCG/5ML IJ SOLN
INTRAMUSCULAR | Status: AC
  Filled 2020-09-29: qty 5

## 2020-09-29 MED ORDER — ROSUVASTATIN CALCIUM 20 MG PO TABS
20.0000 mg | ORAL_TABLET | Freq: Every day | ORAL | Status: DC
  Administered 2020-09-30 – 2020-10-01 (×2): 20 mg via ORAL
  Filled 2020-09-29 (×2): qty 1

## 2020-09-29 MED ORDER — CHLORHEXIDINE GLUCONATE 0.12 % MT SOLN
OROMUCOSAL | Status: AC
  Administered 2020-09-29: 15 mL via OROMUCOSAL
  Filled 2020-09-29: qty 15

## 2020-09-29 MED ORDER — ALBUTEROL SULFATE HFA 108 (90 BASE) MCG/ACT IN AERS
INHALATION_SPRAY | RESPIRATORY_TRACT | Status: DC | PRN
  Administered 2020-09-29 (×2): 4 via RESPIRATORY_TRACT

## 2020-09-29 MED ORDER — METOPROLOL TARTRATE 5 MG/5ML IV SOLN: 2.0000 mg | INTRAVENOUS | Status: DC | PRN

## 2020-09-29 MED ORDER — ALBUTEROL SULFATE (2.5 MG/3ML) 0.083% IN NEBU: 2.5000 mg | INHALATION_SOLUTION | Freq: Four times a day (QID) | RESPIRATORY_TRACT | Status: DC | PRN

## 2020-09-29 MED ORDER — MIRABEGRON ER 25 MG PO TB24
25.0000 mg | ORAL_TABLET | Freq: Every day | ORAL | Status: DC
  Administered 2020-09-30 – 2020-10-01 (×2): 25 mg via ORAL
  Filled 2020-09-29 (×3): qty 1

## 2020-09-29 MED ORDER — HEPARIN 6000 UNIT IRRIGATION SOLUTION
Status: DC | PRN
  Administered 2020-09-29: 1

## 2020-09-29 MED ORDER — POTASSIUM CHLORIDE CRYS ER 20 MEQ PO TBCR: 20.0000 meq | EXTENDED_RELEASE_TABLET | Freq: Every day | ORAL | Status: DC | PRN

## 2020-09-29 MED ORDER — HEPARIN 6000 UNIT IRRIGATION SOLUTION
Status: AC
  Filled 2020-09-29: qty 500

## 2020-09-29 MED ORDER — VANCOMYCIN HCL 500 MG/100ML IV SOLN
500.0000 mg | INTRAVENOUS | Status: DC
  Administered 2020-09-30 – 2020-10-03 (×3): 500 mg via INTRAVENOUS
  Filled 2020-09-29 (×3): qty 100

## 2020-09-29 MED ORDER — BISACODYL 10 MG RE SUPP: 10.0000 mg | Freq: Every day | RECTAL | Status: DC | PRN

## 2020-09-29 MED ORDER — CALCIUM CARBONATE 1250 (500 CA) MG PO TABS
1.0000 | ORAL_TABLET | Freq: Every day | ORAL | Status: DC
  Administered 2020-09-30 – 2020-10-01 (×2): 500 mg via ORAL
  Filled 2020-09-29 (×7): qty 1

## 2020-09-29 MED ORDER — EPHEDRINE SULFATE 50 MG/ML IJ SOLN
INTRAMUSCULAR | Status: DC | PRN
  Administered 2020-09-29: 15 mg via INTRAVENOUS

## 2020-09-29 MED ORDER — AMISULPRIDE (ANTIEMETIC) 5 MG/2ML IV SOLN: 10.0000 mg | Freq: Once | INTRAVENOUS | Status: DC | PRN

## 2020-09-29 MED ORDER — PHENOL 1.4 % MT LIQD: 1.0000 | OROMUCOSAL | Status: DC | PRN

## 2020-09-29 MED ORDER — ORAL CARE MOUTH RINSE: 15.0000 mL | Freq: Once | OROMUCOSAL | Status: AC

## 2020-09-29 MED ORDER — MAGNESIUM SULFATE 2 GM/50ML IV SOLN: 2.0000 g | Freq: Every day | INTRAVENOUS | Status: DC | PRN

## 2020-09-29 MED ORDER — HYDRALAZINE HCL 20 MG/ML IJ SOLN: 5.0000 mg | INTRAMUSCULAR | Status: DC | PRN

## 2020-09-29 MED ORDER — SUCCINYLCHOLINE CHLORIDE 200 MG/10ML IV SOSY
PREFILLED_SYRINGE | INTRAVENOUS | Status: DC | PRN
  Administered 2020-09-29: 60 mg via INTRAVENOUS

## 2020-09-29 MED ORDER — MOMETASONE FURO-FORMOTEROL FUM 200-5 MCG/ACT IN AERO
2.0000 | INHALATION_SPRAY | Freq: Two times a day (BID) | RESPIRATORY_TRACT | Status: DC
  Administered 2020-09-30 – 2020-10-01 (×4): 2 via RESPIRATORY_TRACT
  Filled 2020-09-29: qty 8.8

## 2020-09-29 MED ORDER — FENTANYL CITRATE (PF) 100 MCG/2ML IJ SOLN: 25.0000 ug | INTRAMUSCULAR | Status: DC | PRN

## 2020-09-29 MED ORDER — GUAIFENESIN-DM 100-10 MG/5ML PO SYRP
15.0000 mL | ORAL_SOLUTION | ORAL | Status: DC | PRN
Start: 2020-09-29 — End: 2020-10-06

## 2020-09-29 MED ORDER — VANCOMYCIN HCL 1000 MG IV SOLR
INTRAVENOUS | Status: DC | PRN
  Administered 2020-09-29: 1000 mg via INTRAVENOUS

## 2020-09-29 MED ORDER — PROPOFOL 10 MG/ML IV BOLUS
INTRAVENOUS | Status: DC | PRN
Start: 2020-09-29 — End: 2020-09-29
  Administered 2020-09-29: 70 mg via INTRAVENOUS
  Administered 2020-09-29 (×2): 30 mg via INTRAVENOUS

## 2020-09-29 MED ORDER — SODIUM CHLORIDE 0.9% IV SOLUTION: Freq: Once | INTRAVENOUS | Status: AC

## 2020-09-29 MED ORDER — HYDROCODONE-ACETAMINOPHEN 5-325 MG PO TABS
1.0000 | ORAL_TABLET | Freq: Four times a day (QID) | ORAL | Status: DC | PRN
Start: 2020-09-29 — End: 2020-10-03
  Administered 2020-09-30 – 2020-10-02 (×7): 1 via ORAL
  Filled 2020-09-29 (×7): qty 1

## 2020-09-29 MED ORDER — 0.9 % SODIUM CHLORIDE (POUR BTL) OPTIME
TOPICAL | Status: DC | PRN
  Administered 2020-09-29: 5000 mL

## 2020-09-29 MED ORDER — ONDANSETRON HCL 4 MG/2ML IJ SOLN
4.0000 mg | Freq: Four times a day (QID) | INTRAMUSCULAR | Status: DC | PRN
Start: 2020-09-29 — End: 2020-10-03

## 2020-09-29 MED ORDER — CARVEDILOL 6.25 MG PO TABS
6.2500 mg | ORAL_TABLET | Freq: Two times a day (BID) | ORAL | Status: DC
  Administered 2020-10-01: 6.25 mg via ORAL
  Filled 2020-09-29 (×2): qty 1

## 2020-09-29 MED ORDER — SODIUM CHLORIDE 0.9 % IV SOLN
2.0000 g | Freq: Two times a day (BID) | INTRAVENOUS | Status: DC
  Administered 2020-09-29 – 2020-09-30 (×3): 2 g via INTRAVENOUS
  Filled 2020-09-29 (×3): qty 2

## 2020-09-29 MED ORDER — ESMOLOL HCL 100 MG/10ML IV SOLN
INTRAVENOUS | Status: DC | PRN
  Administered 2020-09-29: 30 mg via INTRAVENOUS

## 2020-09-29 MED ORDER — HEPARIN SODIUM (PORCINE) 1000 UNIT/ML IJ SOLN
INTRAMUSCULAR | Status: DC | PRN
  Administered 2020-09-29: 5000 [IU] via INTRAVENOUS

## 2020-09-29 MED ORDER — SUGAMMADEX SODIUM 200 MG/2ML IV SOLN
INTRAVENOUS | Status: DC | PRN
  Administered 2020-09-29: 100 mg via INTRAVENOUS

## 2020-09-29 MED ORDER — HYDROMORPHONE HCL 1 MG/ML IJ SOLN
0.5000 mg | INTRAMUSCULAR | Status: DC | PRN
  Administered 2020-09-29 – 2020-10-02 (×13): 0.5 mg via INTRAVENOUS
  Filled 2020-09-29 (×13): qty 0.5

## 2020-09-29 MED ORDER — ALUM & MAG HYDROXIDE-SIMETH 200-200-20 MG/5ML PO SUSP: 15.0000 mL | ORAL | Status: DC | PRN

## 2020-09-29 MED ORDER — ACETAMINOPHEN 325 MG RE SUPP: 325.0000 mg | RECTAL | Status: DC | PRN

## 2020-09-29 MED ORDER — ALBUMIN HUMAN 5 % IV SOLN: INTRAVENOUS | Status: DC | PRN

## 2020-09-29 MED ORDER — DOCUSATE SODIUM 100 MG PO CAPS
100.0000 mg | ORAL_CAPSULE | Freq: Every day | ORAL | Status: DC
  Administered 2020-09-30 – 2020-10-01 (×2): 100 mg via ORAL
  Filled 2020-09-29 (×2): qty 1

## 2020-09-29 MED ORDER — SODIUM CHLORIDE 0.9 % IV SOLN: INTRAVENOUS | Status: DC | PRN

## 2020-09-29 MED ORDER — LACTATED RINGERS IV SOLN: INTRAVENOUS | Status: DC

## 2020-09-29 MED ORDER — DICYCLOMINE HCL 20 MG PO TABS
20.0000 mg | ORAL_TABLET | Freq: Three times a day (TID) | ORAL | Status: DC
  Administered 2020-09-30 – 2020-10-01 (×8): 20 mg via ORAL
  Filled 2020-09-29 (×12): qty 1

## 2020-09-29 SURGICAL SUPPLY — 55 items
ADH SKN CLS APL DERMABOND .7 (GAUZE/BANDAGES/DRESSINGS)
BAG COUNTER SPONGE SURGICOUNT (BAG) ×6 IMPLANT
BAG ISL DRAPE 18X18 STRL (DRAPES) ×4
BAG ISOLATION DRAPE 18X18 (DRAPES) ×4 IMPLANT
BAG SPNG CNTER NS LX DISP (BAG) ×4
CANISTER SUCT 3000ML PPV (MISCELLANEOUS) ×3 IMPLANT
CATH EMB 4FR 80CM (CATHETERS) ×3 IMPLANT
CATH EMB 5FR 80CM (CATHETERS) ×3 IMPLANT
CLIP VESOCCLUDE MED 24/CT (CLIP) ×3 IMPLANT
CLIP VESOCCLUDE SM WIDE 24/CT (CLIP) ×3 IMPLANT
DERMABOND ADVANCED (GAUZE/BANDAGES/DRESSINGS)
DERMABOND ADVANCED .7 DNX12 (GAUZE/BANDAGES/DRESSINGS) IMPLANT
DRAPE HALF SHEET 40X57 (DRAPES) ×3 IMPLANT
DRAPE INCISE IOBAN 66X45 STRL (DRAPES) ×3 IMPLANT
DRAPE ISOLATION BAG 18X18 (DRAPES) ×6
DRSG COVADERM 4X6 (GAUZE/BANDAGES/DRESSINGS) ×3 IMPLANT
ELECT REM PT RETURN 9FT ADLT (ELECTROSURGICAL) ×3
ELECTRODE REM PT RTRN 9FT ADLT (ELECTROSURGICAL) ×2 IMPLANT
GAUZE 4X4 16PLY ~~LOC~~+RFID DBL (SPONGE) ×3 IMPLANT
GLOVE SURG ENC MOIS LTX SZ7.5 (GLOVE) ×3 IMPLANT
GOWN STRL REUS W/ TWL LRG LVL3 (GOWN DISPOSABLE) ×6 IMPLANT
GOWN STRL REUS W/TWL LRG LVL3 (GOWN DISPOSABLE) ×9
IV NS IRRIG 3000ML ARTHROMATIC (IV SOLUTION) IMPLANT
KIT BASIN OR (CUSTOM PROCEDURE TRAY) ×3 IMPLANT
KIT TURNOVER KIT B (KITS) ×3 IMPLANT
NS IRRIG 1000ML POUR BTL (IV SOLUTION) ×6 IMPLANT
PACK AORTA (CUSTOM PROCEDURE TRAY) ×3 IMPLANT
PAD ARMBOARD 7.5X6 YLW CONV (MISCELLANEOUS) ×6 IMPLANT
PATCH VASC XENOSURE 1CMX6CM (Vascular Products) ×3 IMPLANT
PATCH VASC XENOSURE 1X6 (Vascular Products) ×2 IMPLANT
STAPLER VISISTAT 35W (STAPLE) ×3 IMPLANT
STOPCOCK 4 WAY LG BORE MALE ST (IV SETS) ×6 IMPLANT
SUT PROLENE 5 0 C 1 24 (SUTURE) ×18 IMPLANT
SUT PROLENE 6 0 CC (SUTURE) ×6 IMPLANT
SUT PROLENE 7 0 BV 1 (SUTURE) ×3 IMPLANT
SUT SILK 2 0 (SUTURE) ×3
SUT SILK 2-0 18XBRD TIE 12 (SUTURE) ×2 IMPLANT
SUT SILK 3 0 (SUTURE) ×3
SUT SILK 3-0 18XBRD TIE 12 (SUTURE) ×2 IMPLANT
SUT SILK 4 0 (SUTURE) ×3
SUT SILK 4-0 18XBRD TIE 12 (SUTURE) ×2 IMPLANT
SUT VIC AB 2-0 CTX 36 (SUTURE) ×6 IMPLANT
SUT VIC AB 2-0 SH 27 (SUTURE) ×3
SUT VIC AB 2-0 SH 27XBRD (SUTURE) ×2 IMPLANT
SUT VIC AB 3-0 SH 27 (SUTURE) ×9
SUT VIC AB 3-0 SH 27X BRD (SUTURE) ×6 IMPLANT
SUT VIC AB 4-0 PS2 27 (SUTURE) ×9 IMPLANT
SWAB COLLECTION DEVICE MRSA (MISCELLANEOUS) ×3 IMPLANT
SWAB CULTURE ESWAB REG 1ML (MISCELLANEOUS) ×3 IMPLANT
SYR 3ML LL SCALE MARK (SYRINGE) ×6 IMPLANT
TAPE UMBILICAL COTTON 1/8X30 (MISCELLANEOUS) ×3 IMPLANT
TOWEL GREEN STERILE (TOWEL DISPOSABLE) ×6 IMPLANT
TRAY FOLEY MTR SLVR 16FR STAT (SET/KITS/TRAYS/PACK) ×3 IMPLANT
UNDERPAD 30X36 HEAVY ABSORB (UNDERPADS AND DIAPERS) ×3 IMPLANT
WATER STERILE IRR 1000ML POUR (IV SOLUTION) ×3 IMPLANT

## 2020-09-29 NOTE — Transfer of Care (Signed)
Immediate Anesthesia Transfer of Care Note  Patient: Massie Bougie  Procedure(s) Performed: IRRIGATION AND DEBRIDEMENT LEFT GROIN; (Left) PATCH ANGIOPLASTY LEFT COMMON FEMORAL ARTERY USING XENOSURE BIOLOGIC PATCH (Left: Groin)  Patient Location: PACU  Anesthesia Type:General  Level of Consciousness: drowsy and patient cooperative  Airway & Oxygen Therapy: Patient Spontanous Breathing and Patient connected to nasal cannula oxygen  Post-op Assessment: Report given to RN and Post -op Vital signs reviewed and stable  Post vital signs: Reviewed and stable  Last Vitals:  Vitals Value Taken Time  BP 197/76   Temp    Pulse 72 09/27/2020 2121  Resp 17 10/06/2020 2121  SpO2 100 % 10/18/2020 2121  Vitals shown include unvalidated device data.  Last Pain:  Vitals:   10/15/2020 1632  TempSrc: Oral  PainSc: 0-No pain         Complications: No notable events documented.

## 2020-09-29 NOTE — Progress Notes (Addendum)
Pharmacy Antibiotic Note  [name] is a 75 y.o. female admitted on 09/23/2020 with infected AFB graft. Pharmacy has been consulted for vancomycin and Zosyn dosing. Vancomycin given in OR ~1930. Cr stable <1. Pt has swelling to PCN so will change Zosyn to cefepime (ok per Dr Oneida Alar).  Plan: Cefepime 2g q12h Vancomycin '500mg'$  IV q24h - est AUC 469 Follow Cr, LOT, cultures Vancomycin levels as needed  Height: '4\' 11"'$  (149.9 cm) Weight: 46.7 kg (103 lb) IBW/kg (Calculated) : 43.2  Temp (24hrs), Avg:98.4 F (36.9 C), Min:98.3 F (36.8 C), Max:98.6 F (37 C)  Recent Labs  Lab 10/11/2020 1226  WBC 8.5  CREATININE 0.92    Estimated Creatinine Clearance: 35.5 mL/min (by C-G formula based on SCr of 0.92 mg/dL).    Allergies  Allergen Reactions   Penicillins Swelling   Citalopram Other (See Comments)    Nightmares 10/07/2020 Pt states that she is currently taking this drug.   Morphine And Related Nausea And Vomiting   Losartan Nausea Only and Other (See Comments)    Nausea and dizzy. 10/03/2020 Pt states she does not recall having an intolerance to this drug.    Antimicrobials this admission: Zosyn 8/9 >>  Vancomycin 8/9 >>   Thank you for allowing pharmacy to be a part of this patient's care.   Arrie Senate, PharmD, BCPS, [facility] Clinical Pharmacist [phone] Please check AMION for all [facility] numbers 10/09/2020

## 2020-09-29 NOTE — ED Triage Notes (Signed)
BIB [facility] after husband called to report pt having bleeding from left femoral stent that was placed a couple weeks ago ( per pt). According to EMS, pt loss about 200 cc. Bleeding controlled. Pt also had right stent placed as well.  Hx afib

## 2020-09-29 NOTE — Anesthesia Procedure Notes (Signed)
Procedure Name: Intubation Date/Time: 09/27/2020 5:42 PM Performed by: Oletta Lamas, CRNA Pre-anesthesia Checklist: Patient identified, Emergency Drugs available, Suction available and Patient being monitored Patient Re-evaluated:Patient Re-evaluated prior to induction Oxygen Delivery Method: Circle System Utilized Preoxygenation: Pre-oxygenation with 100% oxygen Induction Type: IV induction Ventilation: Mask ventilation without difficulty Laryngoscope Size: Mac Grade View: Grade I Tube type: Oral Tube size: 7.5 mm Number of attempts: 1 Airway Equipment and Method: Stylet Placement Confirmation: ETT inserted through vocal cords under direct vision, positive ETCO2 and breath sounds checked- equal and bilateral Secured at: 21 cm Tube secured with: Tape Dental Injury: Teeth and Oropharynx as per pre-operative assessment

## 2020-09-29 NOTE — Anesthesia Preprocedure Evaluation (Addendum)
Anesthesia Evaluation  Patient identified by MRN, date of birth, ID band Patient awake    Reviewed: Allergy & Precautions, NPO status , Patient's Chart, lab work & pertinent test results, reviewed documented beta blocker date and time   Airway Mallampati: II  TM Distance: >3 FB Neck ROM: Full    Dental  (+) Edentulous Upper, Dental Advisory Given   Pulmonary neg pulmonary ROS, Current Smoker and Patient abstained from smoking.,    Pulmonary exam normal breath sounds clear to auscultation       Cardiovascular hypertension, Pt. on medications and Pt. on home beta blockers + Past MI and + Peripheral Vascular Disease  Normal cardiovascular exam Rhythm:Regular Rate:Normal     Neuro/Psych PSYCHIATRIC DISORDERS Anxiety Depression CVA    GI/Hepatic negative GI ROS, (+) Hepatitis -  Endo/Other  negative endocrine ROS  Renal/GU negative Renal ROS     Musculoskeletal negative musculoskeletal ROS (+)   Abdominal   Peds  Hematology negative hematology ROS (+)   Anesthesia Other Findings   Reproductive/Obstetrics                           Anesthesia Physical Anesthesia Plan  ASA: 3 and emergent  Anesthesia Plan: General   Post-op Pain Management:    Induction: Intravenous  PONV Risk Score and Plan: 3 and Ondansetron, Treatment may vary due to age or medical condition, Dexamethasone and Diphenhydramine  Airway Management Planned: Oral ETT  Additional Equipment: Arterial line and CVP  Intra-op Plan:   Post-operative Plan: Possible Post-op intubation/ventilation  Informed Consent: I have reviewed the patients History and Physical, chart, labs and discussed the procedure including the risks, benefits and alternatives for the proposed anesthesia with the patient or authorized representative who has indicated his/her understanding and acceptance.     Dental advisory given  Plan Discussed with:  CRNA  Anesthesia Plan Comments:        Anesthesia Quick Evaluation

## 2020-09-29 NOTE — Anesthesia Procedure Notes (Signed)
Arterial Line Insertion Start/End08/06/2020 5:15 PM, 10/05/2020 5:27 PM Performed by: Nolon Nations, MD  Patient location: Pre-op. Preanesthetic checklist: patient identified, IV checked, site marked, risks and benefits discussed, surgical consent, monitors and equipment checked, pre-op evaluation, timeout performed and anesthesia consent Lidocaine 1% used for infiltration Right, radial was placed Catheter size: 20 G Hand hygiene performed , maximum sterile barriers used  and Seldinger technique used  Attempts: 3 (2 x on left by Luan Pulling, CRNA, 1 x on right by Betsaida [place] with U/S) Procedure performed using ultrasound guided technique. Ultrasound Notes:anatomy identified, needle tip was noted to be adjacent to the nerve/plexus identified, no ultrasound evidence of intravascular and/or intraneural injection and image(s) printed for medical record Following insertion, dressing applied and Biopatch. Post procedure assessment: normal and unchanged  Post procedure complications: local hematoma (Local hematoma on left). Patient tolerated the procedure with difficulty.

## 2020-09-29 NOTE — Progress Notes (Signed)
Dr. Lissa Hoard aware of pt trending SBP, no further orders. Pt in no signs of distress, laying calmly in bed. Will continue to monitor.

## 2020-09-29 NOTE — Telephone Encounter (Signed)
Pts daughter calling to let Dr. Acie Fredrickson know pt has had a post op complication regarding her left leg graft site. She is currently in the hospital awaiting surgery for vascular repair. She was not sure if cardiology should be involved.  I advised her that Dr Acie Fredrickson was off tomorrow. If her admitting providers believes she needs to be seen by cardiology, they would defiantly consult them and someone from [facility] would see her. Currently she is not having any cardiac symptoms.   I will forward Dr. Acie Fredrickson for review as well.

## 2020-09-29 NOTE — ED Notes (Signed)
Pt consent signed and at bedside. RN called CT to move pt up on list for imaging per MD Fields.

## 2020-09-29 NOTE — ED Provider Notes (Signed)
[facility] EMERGENCY DEPARTMENT Provider Note   CSN: [id number] Arrival date & time: 09/30/2020  1130     History Chief Complaint  Patient presents with   Post-op Problem    [name] is a 75 y.o. female history of carotid artery occlusion, CVA, hyper otitis, hyperlipidemia, IBS, MI, PVD, aortofemoral bypass graft.  Patient presented today by EMS for concern of left femoral stent bleeding.  Patient reports that she has been fighting an infection in the area and been on antibiotics for the past few days.  Patient reports that she got up to use the bathroom today when she had bleeding from her site, patient blood pressure at that time and reports that bleeding resolved.  Patient reports a minimal pain at the time of bleeding, sharp, brief, nonradiating, improved with time.  Patient denies fever/chills, fall/injury, numbness or tingling, weakness or any additional concerns. HPI     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   Cerebrovascular occlusion 2001   Extracranial with Hx of TIA's   Depression    Hepatitis    Hyperlipidemia    IBS (irritable bowel syndrome)    Myocardial infarction [facility])    Peripheral vascular disease (HCC)    had Carotid En   Rheumatic fever    Stroke (HCC)    no residual- except left side of face has a small "twist"   Thyroid disease    Hypothyroidism    Patient Active Problem List   Diagnosis Date Noted   Surgical wound infection 01/22/2020   S/P PICC central line placement 01/22/2020   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 Amedeo Plenty   CAROTID STENT  2006   CHOLECYSTECTOMY     COLONOSCOPY N/A 05/08/2013   Procedure: COLONOSCOPY;  Surgeon: Rogene Houston, MD;  Location: [facility];  Service: Endoscopy;  Laterality: N/A;  230-moved to [place] notified pt   EYE SURGERY Bilateral    cataract removed lens implanted   PR VEIN BYPASS GRAFT,AORTO-FEM-POP  2001   WOUND DEBRIDEMENT Left 01/22/2020   Procedure: DEBRIDEMENT LEFT GROIN WOUND;  Surgeon: Rosetta Posner, MD;  Location: [facility] OR;  Service: Vascular;  Laterality: Left;     OB History   No obstetric history on file.     Family History  Problem Relation Age of Onset   Cancer Sister    Hypertension Father    Heart attack Mother    Hyperlipidemia Mother    Kidney disease Brother    Cancer Brother    Cancer Brother        Lung    Colon cancer Neg Hx     Social History   Tobacco Use   Smoking status: Every Day    Packs/day: 0.33    Years: 63.00    Pack years: 20.79    Types: Cigarettes   Smokeless tobacco: Never  Tobacco comments:    1 pack every 3 days x 55yr  Vaping Use   Vaping Use: Never used  Substance Use Topics   Alcohol use: No   Drug use: No    Home Medications Prior to Admission medications   Medication Sig Start Date End Date Taking? Authorizing Provider  albuterol (VENTOLIN HFA) 108 (90 Base) MCG/ACT inhaler Inhale 2 puffs into the lungs every 6 (six) hours as needed for wheezing or shortness of breath.   Yes [provider]  apixaban (ELIQUIS) 5 MG TABS tablet Take 1 tablet (5 mg total) by mouth 2 (two) times daily. 01/28/16  Yes Nahser, PWonda Cheng MD  budesonide-formoterol ([facility] 160-4.5 MCG/ACT inhaler Inhale 2 puffs into the lungs daily as needed for wheezing. 06/22/20  Yes [provider]  calcium carbonate (OS-CAL - DOSED IN MG OF ELEMENTAL CALCIUM) 1250 (500 Ca) MG tablet Take 1 tablet by mouth daily.   Yes [provider]  carvedilol (COREG) 6.25 MG tablet Take 1 tablet (6.25 mg total) by mouth 2 (two) times daily. 02/04/16  Yes Nahser, PWonda Cheng MD  citalopram (CELEXA) 20 MG tablet Take 20 mg by mouth daily. 12/06/19  Yes [provider]  cyanocobalamin 1000 MCG tablet Take 1,000 mcg by mouth daily.  02/26/19  Yes [provider]  dicyclomine (BENTYL) 20 MG tablet Take 20 mg by mouth in the morning, at noon, and at bedtime.    Yes [provider]  HYDROcodone-acetaminophen (NORCO/VICODIN) 5-325 MG tablet Take 1 tablet by mouth 4 (four) times daily as needed. 01/22/20  Yes [provider]  levothyroxine (SYNTHROID) 112 MCG tablet Take 112 mcg by mouth daily before breakfast.    Yes [provider]  mirabegron ER (MYRBETRIQ) 25 MG TB24 tablet Take 25 mg by mouth daily.   Yes [provider]  Multiple Vitamin (MULTIVITAMIN) capsule Take 1 capsule by mouth daily.   Yes [provider]  naloxone (NARCAN) nasal spray 4 mg/0.1 mL Place 1 spray into the nose daily as needed (narcotic overdose). 10/31/18  Yes [provider]  Omadacycline Tosylate 150 MG TABS Take 3 tablets (450 mg) by mouth once daily on days 1 and 2 and then 2 tablets (300 mg) by mouth once daily thereafter. 09/25/20  Yes Kuppelweiser, Cassie L, RPH-CPP  rosuvastatin (CRESTOR) 20 MG tablet Take 20 mg by mouth daily.   Yes [provider]  tiZANidine (ZANAFLEX) 4 MG tablet Take 1 tablet by mouth as needed. 12/24/18  Yes [provider]  doxycycline (VIBRAMYCIN) 100 MG capsule Take 1 capsule (100 mg total) by mouth 2 (two) times daily. Patient not taking: No sig reported 08/20/20   FElam Dutch MD    Allergies    Penicillins, Citalopram, Morphine and related, and Losartan  Review of Systems   Review of  Systems Ten systems are reviewed and are negative for acute change except as noted in the HPI  Physical Exam Updated Vital Signs BP (!) 194/74   Pulse 62   Temp 98.4 F (36.9 C) (Oral)   Resp 15   Ht '4\' 11"'$  (1.499 m)   Wt 46.7 kg   SpO2 95%   BMI 20.80 kg/m   Physical Exam Constitutional:      General: She is not in acute distress.    Appearance: Normal appearance. She is well-developed. She is not ill-appearing or diaphoretic.  HENT:     Head: Normocephalic and atraumatic.  Eyes:     General:  Vision grossly intact. Gaze aligned appropriately.     Pupils: Pupils are equal, round, and reactive to light.  Neck:     Trachea: Trachea and phonation normal.  Cardiovascular:     Pulses:          Dorsalis pedis pulses are 2+ on the right side and 2+ on the left side.  Pulmonary:     Effort: Pulmonary effort is normal. No respiratory distress.  Abdominal:     General: There is no distension.     Palpations: Abdomen is soft.     Tenderness: There is no abdominal tenderness. There is no guarding or rebound.       Comments: No active bleeding  Musculoskeletal:        General: Normal range of motion.     Cervical back: Normal range of motion.  Skin:    General: Skin is warm and dry.  Neurological:     Mental Status: She is alert.     GCS: GCS eye subscore is 4. GCS verbal subscore is 5. GCS motor subscore is 6.     Comments: Speech is clear and goal oriented, follows commands Major Cranial nerves without deficit, no facial droop Moves extremities without ataxia, coordination intact  Psychiatric:        Behavior: Behavior normal.    ED Results / Procedures / Treatments   Labs (all labs ordered are listed, but only abnormal results are displayed) Labs Reviewed  CBC WITH DIFFERENTIAL/PLATELET - Abnormal; Notable for the following components:      Result Value   Hemoglobin 10.6 (*)    HCT 34.6 (*)    All other components within normal limits  BASIC METABOLIC PANEL -  Abnormal; Notable for the following components:   Chloride 97 (*)    All other components within normal limits  SARS CORONAVIRUS 2 (TAT 6-24 HRS)  ABO/RH  TYPE AND SCREEN  PREPARE RBC (CROSSMATCH)    EKG EKG Interpretation  Date/Time:  Tuesday September 29 2020 11:46:18 EDT Ventricular Rate:  61 PR Interval:  190 QRS Duration: 83 QT Interval:  423 QTC Calculation: 427 R Axis:   73 Text Interpretation: Sinus rhythm RSR' in V1 or V2, probably normal variant Confirmed by Thamas Jaegers ([id number]) on 09/22/2020 12:50:54 PM  Radiology No results found.  Procedures Procedures   Medications Ordered in ED Medications  0.9 %  sodium chloride infusion (Manually program via Guardrails IV Fluids) ( Intravenous New Bag/Given 10/18/2020 1440)    ED Course  I have reviewed the triage vital signs and the nursing notes.  Pertinent labs & imaging results that were available during my care of the patient were reviewed by me and considered in my medical decision making (see chart for details).    MDM Rules/Calculators/A&P                          Additional history obtained from: Nursing notes from this visit. Review of electronic medical records. --------------------- 75 year old female presented for concern of bleeding of her left femoral bypass graft.  No active bleeding on initial examination.  Neurovascular intact distally.  Basic labs including CBC and BMP were ordered.  Consult was placed to vascular surgery.  I ordered, reviewed and interpreted labs which include: CBC with mild anemia of 10.6, no leukocytosis or thrombocytopenia. BMP unremarkable. - Consulted with Dr. Oneida Alar, Dr. Oneida Alar then placed order for CT angio of patient's graft along with COVID test and type and  screen.  Patient is scheduled for or later on today. - Patient reassessed resting calmly bed no acute distress she states understanding of care plan.  Patient has no recurrence of bleeding.  Case was discussed with attending  physician Dr. Almyra Free who agrees with work-up.  Note: Portions of this report may have been transcribed using voice recognition software. Every effort was made to ensure accuracy; however, inadvertent computerized transcription errors may still be present.   Final Clinical Impression(s) / ED Diagnoses Final diagnoses:  Other mechanical complication of femoral arterial graft (bypass), initial encounter [facility])    Rx / DC Orders ED Discharge Orders     None        Gari Crown 10/12/2020 1531    Luna Fuse, MD 10/03/20 1512

## 2020-09-29 NOTE — Telephone Encounter (Signed)
  Pt's daughter calling, she said pt is in [facility] hospital and requesting if Dr. Acie Fredrickson sees the pt there tomorrow

## 2020-09-29 NOTE — Op Note (Signed)
Procedure: Incision debridement irrigation left groin patch angioplasty left common femoral artery (bovine pericardium)  Preoperative diagnosis: Infected left groin aortobifem  Postoperative diagnosis: Same  Anesthesia: General  Assistant: Leontine Locket, PA-C for assistance with exposure expediting procedure  Operative findings: 1.  Actively bleeding anastomotic femoral pseudoaneurysm along medial wall of common femoral anastomosis  2.  Intraoperative culture  Operative details: After obtaining informed consent, the patient is taken the operating.  The patient was placed in supine position operating table.  The patient was prepped and draped in usual sterile fashion from the chin to the toes after endotracheal intubation.  Next a longitudinal incision was made in the left groin over an area of sinus tract that had been actively bleeding.  The incision was carried on through subcutaneous tissues down the level of the proximal superficial femoral artery.  This was dissected free circumferentially.  Dissection was then carried up to the level of the profunda femoris there was 1 more anterior branch and a vessel loop was placed around this.  There was also a larger deeper posterior branch and a vessel loop was also placed around this.  Dissection was then carried up to the level of the common femoral artery.  There was a circumflex iliac branch which was injured during course of dissection and this was clipped on the medial side and ligated with a 5-0 Prolene suture on the lateral side.  I was able to get most of the lateral wall of the common femoral artery dissected out but I was unable to separate the limb of the aortobifem from the native common femoral artery due to dense scar tissue.  The common femoral artery was also fairly adherent to the femoral vein and its proximal aspect.  I was able to free up the common femoral artery essentially circumferentially over most of its surface.  At this point I  had a reasonable clamp area above the area of what was obviously an anastomotic pseudoaneurysm.  There was thrombus present with pulsatility.  The patient was given 5000 units of intravenous heparin.  Intraoperative cultures were taken of the graft material.  The common femoral artery and left limb of the aortobifem were controlled with a peripheral DeBakey clamp.  The profunda femoris and superficial femoral arteries were controlled with Vesseloops.  I then opened up the area of thrombus and it was apparent that the lateral wall of the anastomosis had completely disrupted.  I opened the hood of the graft longitudinally and down onto the proximal superficial femoral artery so that we could debride away as much as possible the unhealthy appearing artery.  There was some calcific disease on the posterior wall of the artery but overall this was fairly syllable.  The pre-existing heel and proximal half of the old suture line was reinforced with a running 5-0 Prolene suture all the way to the corner on each side.  A bovine pericardial patch was then brought on the operative field and sewn on as a patch angioplasty extending from about 2 cm onto the aortobifemoral limb down onto the common femoral and onto the proximal first centimeter the superficial femoral artery.  Everything was then for blood backbled and thoroughly flushed.  Anastomosis was secured clamps released there is pulsatile flow in the profunda and superficial femoral artery immediately.  There was good biphasic Doppler flow in the posterior tibial and anterior tibial artery at the foot.  Hemostasis was obtained with direct pressure and 50 mg of protamine.  The wound  was irrigated with 4 L of normal saline solution.  The groin was then closed with a running 2-0 Vicryl suture followed by 3-0 Vicryl suture and staples in the skin.  The patient tolerated procedure well and there were no complications.  The instrument sponge and needle count was correct the end  of the case.  The patient was taken to the recovery room in stable condition.  Ruta Hinds, MD [facility] Office: [phone]

## 2020-09-29 NOTE — Anesthesia Procedure Notes (Signed)
Central Venous Catheter Insertion Performed by: Nolon Nations, MD, anesthesiologist Start/End08/11/2020 5:45 PM, 10/06/2020 6:05 PM Patient location: Pre-op. Preanesthetic checklist: patient identified, IV checked, site marked, risks and benefits discussed, surgical consent, monitors and equipment checked, pre-op evaluation, timeout performed and anesthesia consent Position: supine Lidocaine 1% used for infiltration and patient sedated Hand hygiene performed  and maximum sterile barriers used  Catheter size: 9 Fr MAC introducer Procedure performed using ultrasound guided technique. Ultrasound Notes:anatomy identified, needle tip was noted to be adjacent to the nerve/plexus identified, no ultrasound evidence of intravascular and/or intraneural injection and image(s) printed for medical record Attempts: 1 Following insertion, line sutured, dressing applied and Biopatch. Post procedure assessment: blood return through all ports, free fluid flow and no air  Patient tolerated the procedure well with no immediate complications.

## 2020-09-29 NOTE — Consult Note (Addendum)
Hospital Consult    Reason for Consult:  bleeding from left groin Requesting Physician:  ER MRN #:  [id number]  History of Present Illness: This is a 75 y.o. female who presents with bleeding in the left groin.  She was seen in our office 4 days ago by Dr. Donzetta Matters.  She has hx of aortobifemoral bypass graft and subsequent sinus drainage from the left groin that was debrided and closed.  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  Dr. Donzetta Matters reviewed her tagged white cell scan that was 3 months ago and this was unremarkable.  She was wanting to avoid any large operations but he felt she needed at the very least debridement with possible muscle flap.  It was his plan to have her get a CT scan and see ID and see her back in 1-2 weeks.  Unfortunately, this morning, she had profuse bleeding from the left groin.  She says that her night gown is soaked with blood.  Her husband was able to stop the bleeding and EMS was called and brought her to [facility].  She is currently not bleeding.  Her last dose of Eliquis was last night.  She has not eaten today.  She denies any fevers.  The pt is on a statin for cholesterol management.  The pt is not on a daily aspirin.   Other AC:  Eliquis for PAF The pt is not on medication for hypertension.   The pt is not diabetic.   Tobacco hx:  current  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   Cerebrovascular occlusion 2001   Extracranial with Hx of TIA's   Depression    Hepatitis    Hyperlipidemia    IBS (irritable bowel syndrome)    Myocardial infarction [facility])    Peripheral vascular disease (HCC)    had Carotid En   Rheumatic fever    Stroke (HCC)    no residual- except left side of face has a small "twist"   Thyroid disease    Hypothyroidism    Past Surgical History:  Procedure Laterality Date   CAROTID ENDARTERECTOMY  11/25/1999   S/P Left   CAROTID ENDARTERECTOMY  03/24/2004   Left CCA stent- Dr.  Amedeo Plenty   CAROTID STENT  2006   CHOLECYSTECTOMY     COLONOSCOPY N/A 05/08/2013   Procedure: COLONOSCOPY;  Surgeon: Rogene Houston, MD;  [facility];  Service: Endoscopy;  Laterality: N/A;  230-moved to [place] notified pt   EYE SURGERY Bilateral    cataract removed lens implanted   PR VEIN BYPASS GRAFT,AORTO-FEM-POP  2001   WOUND DEBRIDEMENT Left 01/22/2020   Procedure: DEBRIDEMENT LEFT GROIN WOUND;  Surgeon: Rosetta Posner, MD;  Location: [facility];  Service: Vascular;  Laterality: Left;    Allergies  Allergen Reactions   Penicillins Swelling   Citalopram Other (See Comments)    Nightmares 09/28/2020 Pt states that she is currently taking this drug.   Morphine And Related Nausea And Vomiting   Losartan Nausea Only and Other (See Comments)    Nausea and dizzy. 09/28/2020 Pt states she does not recall having an intolerance to this drug.    Prior to Admission medications   Medication Sig Start Date End Date Taking? Authorizing Provider  albuterol (VENTOLIN HFA) 108 (90 Base) MCG/ACT inhaler Inhale 2 puffs into the lungs every 6 (six) hours as needed for wheezing or shortness of  breath.   Yes [provider]  apixaban (ELIQUIS) 5 MG TABS tablet Take 1 tablet (5 mg total) by mouth 2 (two) times daily. 01/28/16  Yes Nahser, Wonda Cheng, MD  budesonide-formoterol [facility]) 160-4.5 MCG/ACT inhaler Inhale 2 puffs into the lungs daily as needed for wheezing. 06/22/20  Yes [provider]  calcium carbonate (OS-CAL - DOSED IN MG OF ELEMENTAL CALCIUM) 1250 (500 Ca) MG tablet Take 1 tablet by mouth daily.   Yes [provider]  carvedilol (COREG) 6.25 MG tablet Take 1 tablet (6.25 mg total) by mouth 2 (two) times daily. 02/04/16  Yes Nahser, Wonda Cheng, MD  citalopram (CELEXA) 20 MG tablet Take 20 mg by mouth daily. 12/06/19  Yes [provider]  cyanocobalamin 1000 MCG tablet Take 1,000 mcg by mouth daily.  02/26/19  Yes [provider]  dicyclomine (BENTYL) 20  MG tablet Take 20 mg by mouth in the morning, at noon, and at bedtime.    Yes [provider]  HYDROcodone-acetaminophen (NORCO/VICODIN) 5-325 MG tablet Take 1 tablet by mouth 4 (four) times daily as needed. 01/22/20  Yes [provider]  levothyroxine (SYNTHROID) 112 MCG tablet Take 112 mcg by mouth daily before breakfast.    Yes [provider]  mirabegron ER (MYRBETRIQ) 25 MG TB24 tablet Take 25 mg by mouth daily.   Yes [provider]  Multiple Vitamin (MULTIVITAMIN) capsule Take 1 capsule by mouth daily.   Yes [provider]  naloxone (NARCAN) nasal spray 4 mg/0.1 mL Place 1 spray into the nose daily as needed (narcotic overdose). 10/31/18  Yes [provider]  Omadacycline Tosylate 150 MG TABS Take 3 tablets (450 mg) by mouth once daily on days 1 and 2 and then 2 tablets (300 mg) by mouth once daily thereafter. 09/25/20  Yes Kuppelweiser, Cassie L, RPH-CPP  rosuvastatin (CRESTOR) 20 MG tablet Take 20 mg by mouth daily.   Yes [provider]  tiZANidine (ZANAFLEX) 4 MG tablet Take 1 tablet by mouth as needed. 12/24/18  Yes [provider]  doxycycline (VIBRAMYCIN) 100 MG capsule Take 1 capsule (100 mg total) by mouth 2 (two) times daily. Patient not taking: No sig reported 08/20/20   Elam Dutch, MD    Social History   Socioeconomic History   Marital status: Married    Spouse name: Not on file   Number of children: Not on file   Years of education: Not on file   Highest education level: Not on file  Occupational History   Not on file  Tobacco Use   Smoking status: Every Day    Packs/day: 0.33    Years: 63.00    Pack years: 20.79    Types: Cigarettes   Smokeless tobacco: Never   Tobacco comments:    1 pack every 3 days x 80yr  Vaping Use   Vaping Use: Never used  Substance and Sexual Activity   Alcohol use: No   Drug use: No   Sexual activity: Not on file  Other Topics Concern   Not on file  Social  History Narrative   Not on file   Social Determinants of Health   Financial Resource Strain: Not on file  Food Insecurity: Not on file  Transportation Needs: Not on file  Physical Activity: Not on file  Stress: Not on file  Social Connections: Not on file  Intimate Partner Violence: Not on file     Family History  Problem Relation Age of Onset  Cancer Sister    Hypertension Father    Heart attack Mother    Hyperlipidemia Mother    Kidney disease Brother    Cancer Brother    Cancer Brother        Lung    Colon cancer Neg Hx     ROS: '[x]'$  Positive   '[ ]'$  Negative   '[ ]'$  All sytems reviewed and are negative  Cardiac: '[]'$  chest pain/pressure '[]'$  palpitations '[]'$  SOB lying flat '[]'$  DOE  Vascular: '[]'$  pain in legs while walking '[]'$  pain in legs at rest '[]'$  pain in legs at night '[]'$  non-healing ulcers '[]'$  hx of DVT '[]'$  swelling in legs  Pulmonary: '[]'$  asthma/wheezing '[]'$  home O2  Neurologic: '[]'$  hx of CVA '[]'$  mini stroke   Hematologic: '[]'$  hx of cancer  Endocrine:   '[]'$  diabetes '[x]'$  thyroid disease  GI '[]'$  GERD  GU: '[]'$  CKD/renal failure '[]'$  HD--'[]'$  M/W/F or '[]'$  T/T/S  Psychiatric: '[]'$  anxiety '[]'$  depression  Musculoskeletal: '[]'$  arthritis '[]'$  joint pain  Integumentary: '[]'$  rashes '[]'$  ulcers  Constitutional: '[]'$  fever  '[]'$  chills  Physical Examination  Vitals:   09/21/2020 1140 10/01/2020 1200  BP:  129/62  Pulse:  62  Resp:  20  Temp: 98.4 F (36.9 C)   SpO2: 95% 95%   Body mass index is 20.8 kg/m.  General:  WDWN in NAD Gait: Not observed HENT: WNL, normocephalic Pulmonary: normal non-labored breathing Cardiac: regular, Abdomen:  soft, NT/ND Skin: without rashes Vascular Exam/Pulses:  Right Left  Radial 2+ (normal) 2+ (normal)  DP 2+ (normal) 2+ (normal)  PT Unable to palpate Unable to palpate   Extremities: without ischemic changes, without Gangrene , without cellulitis; without open wounds; left groin with abscess not currently  bleeding. Musculoskeletal: no muscle wasting or atrophy  Neurologic: A&O X 3; speech is fluent/normal Psychiatric:  The pt has Normal affect.   CBC    Component Value Date/Time   WBC 8.5 09/26/2020 1226   RBC 3.98 10/14/2020 1226   HGB 10.6 (L) 10/03/2020 1226   HCT 34.6 (L) 09/28/2020 1226   PLT 243 10/14/2020 1226   MCV 86.9 10/10/2020 1226   MCH 26.6 10/10/2020 1226   MCHC 30.6 10/10/2020 1226   RDW 14.7 09/23/2020 1226   LYMPHSABS 1.7 10/15/2020 1226   MONOABS 0.8 09/28/2020 1226   EOSABS 0.4 10/18/2020 1226   BASOSABS 0.1 10/04/2020 1226    BMET    Component Value Date/Time   NA 137 01/23/2020 0333   NA 142 07/03/2019 1116   K 4.3 01/23/2020 0333   CL 96 (L) 01/23/2020 0333   CO2 28 01/23/2020 0333   GLUCOSE 146 (H) 01/23/2020 0333   BUN 20 01/23/2020 0333   BUN 21 07/03/2019 1116   CREATININE 0.91 01/23/2020 0333   CALCIUM 9.2 01/23/2020 0333   GFRNONAA >60 01/23/2020 0333   GFRAA 57 (L) 07/03/2019 1116       Non-Invasive Vascular Imaging:   None at this time   ASSESSMENT/PLAN: This is a 75 y.o. female with hx of ABF bypass grafting ~ 10 years ago by Dr. Kellie Simmering who has infected left limb that has bled.  Pt's last dose of Eliquis was last night.   -discussed with pt with this infected limb and her bleed this morning, that this is a life threatening situation.  She at the very least will need removal of the left limb.  She was started on Omadacycline last week.  Plan for OR today.  Dr.  Shandon Burlingame to see pt and determine surgical intervention at this time.  Her last dose of Eliquis was yesterday. -despite bleed this morning, hgb stable from when she was checked in December. -pt has not eaten today.     Leontine Locket, PA-C [facility] [phone]  History and exam details as above.  Patient with known aortic graft infection.  She is previously had I&D of her left groin in December.  She has had intermittent reaccumulation's of swelling in  the left groin most likely consistent with continued infection.  We have had several conversations with the patient previously about removing the graft and reconstructing her.  She was also evaluated at [facility] regarding this.  In the past patient has been reluctant to proceed due to the large size of the operation and risk involved.  However, she has now had a herald bleed from her left groin.  She is at high risk for rebleed and exsanguination.  At this point I do not believe we have any other options other than removing the left limb of the graft and at a minimum patching the artery which would put her at high risk for limb loss on the left side.  Otherwise possibly doing an axillary femoral bypass to the left side with very high risk of reinfection.  On exam patient has bilateral 2+ femoral pulses there is a sinus tract in the left groin with fresh blood.  This is not currently bleeding.  She has 2+ dorsalis pedis pulses bilaterally.  Abdomen is soft and nontender.  Lengthy discussion with patient and her daughter today.  They are at this point willing to proceed with I&D of the left groin most likely removal of the left limb of the graft and patch angioplasty of the left femoral artery.  Hopefully her leg will have reasonable perfusion that we can reconstruct her in a few days after cleaning up the left groin to reduce risk of infection.  However, she may need an axillary femoral bypass today for limb salvage.  She understands that if we do that there is a very high risk and will get reinfected.  Risk benefits possible complications and procedure details in addition to the above include ongoing bleeding requirements for transfusion possible myocardial events possible pulmonary events and possible death.  She understands and agrees to proceed.  Patient needs stat CT angio of the abdomen and pelvis with runoff for planning reconstruction  We will keep patient n.p.o. to plan on operating on  her left groin this evening  Will get type and cross and set up several units of blood  Ruta Hinds, MD [facility] [place]: [phone]

## 2020-09-29 NOTE — Anesthesia Postprocedure Evaluation (Signed)
Anesthesia Post Note  Patient: [name]  Procedure(s) Performed: IRRIGATION AND DEBRIDEMENT LEFT GROIN; (Left) PATCH ANGIOPLASTY LEFT COMMON FEMORAL ARTERY USING XENOSURE BIOLOGIC PATCH (Left: Groin)     Patient location during evaluation: PACU Anesthesia Type: General Level of consciousness: awake and alert Pain management: pain level controlled Vital Signs Assessment: post-procedure vital signs reviewed and stable Respiratory status: spontaneous breathing, nonlabored ventilation, respiratory function stable and patient connected to nasal cannula oxygen Cardiovascular status: blood pressure returned to baseline and stable Postop Assessment: no apparent nausea or vomiting Anesthetic complications: no   No notable events documented.  Last Vitals:  Vitals:   10/15/2020 2200 09/27/2020 2215  BP: (!) 178/64 (!) 166/82  Pulse: 60 (!) 59  Resp: 13 13  Temp:  36.6 C  SpO2: 100% 100%    Last Pain:  Vitals:   09/28/2020 2215  TempSrc:   PainSc: [place] Shifa [place]

## 2020-09-30 ENCOUNTER — Ambulatory Visit (HOSPITAL_COMMUNITY)
Admission: RE | Admit: 2020-09-30 | Payer: Medicare HMO | Source: Ambulatory Visit | Attending: Cardiovascular Disease | Admitting: Cardiovascular Disease

## 2020-09-30 ENCOUNTER — Other Ambulatory Visit: Payer: Self-pay

## 2020-09-30 ENCOUNTER — Other Ambulatory Visit (HOSPITAL_COMMUNITY): Payer: Self-pay

## 2020-09-30 ENCOUNTER — Encounter (HOSPITAL_COMMUNITY): Payer: Self-pay | Admitting: Vascular Surgery

## 2020-09-30 DIAGNOSIS — T827XXA Infection and inflammatory reaction due to other cardiac and vascular devices, implants and grafts, initial encounter: Principal | ICD-10-CM

## 2020-09-30 LAB — PREPARE FRESH FROZEN PLASMA
Unit division: 0
Unit division: 0
Unit division: 0
Unit division: 0

## 2020-09-30 LAB — BPAM FFP
Blood Product Expiration Date: 202208142359
Blood Product Expiration Date: 202208142359
Blood Product Expiration Date: 202208142359
Blood Product Expiration Date: 202208142359
Unit Type and Rh: 6200
Unit Type and Rh: 6200
Unit Type and Rh: 6200
Unit Type and Rh: 6200

## 2020-09-30 LAB — COMPREHENSIVE METABOLIC PANEL
ALT: 15 U/L (ref 0–44)
AST: 22 U/L (ref 15–41)
Albumin: 3 g/dL — ABNORMAL LOW (ref 3.5–5.0)
Alkaline Phosphatase: 50 U/L (ref 38–126)
Anion gap: 7 (ref 5–15)
BUN: 21 mg/dL (ref 8–23)
CO2: 27 mmol/L (ref 22–32)
Calcium: 8.3 mg/dL — ABNORMAL LOW (ref 8.9–10.3)
Chloride: 102 mmol/L (ref 98–111)
Creatinine, Ser: 0.88 mg/dL (ref 0.44–1.00)
GFR, Estimated: >60 mL/min (ref >60)
Glucose, Bld: 103 mg/dL — ABNORMAL HIGH (ref 70–99)
Potassium: 4.4 mmol/L (ref 3.5–5.1)
Sodium: 136 mmol/L (ref 135–145)
Total Bilirubin: 0.5 mg/dL (ref 0.3–1.2)
Total Protein: 5.7 g/dL — ABNORMAL LOW (ref 6.5–8.1)

## 2020-09-30 LAB — LIPID PANEL
Cholesterol: 98 mg/dL (ref 0–200)
HDL: 27 mg/dL — ABNORMAL LOW (ref >40)
LDL Cholesterol: 50 mg/dL (ref 0–99)
Total CHOL/HDL Ratio: 3.6 RATIO
Triglycerides: 104 mg/dL (ref <150)
VLDL: 21 mg/dL (ref 0–40)

## 2020-09-30 LAB — CBC
HCT: 30.4 % — ABNORMAL LOW (ref 36.0–46.0)
Hemoglobin: 9.3 g/dL — ABNORMAL LOW (ref 12.0–15.0)
MCH: 26.6 pg (ref 26.0–34.0)
MCHC: 30.6 g/dL (ref 30.0–36.0)
MCV: 86.9 fL (ref 80.0–100.0)
Platelets: 216 10*3/uL (ref 150–400)
RBC: 3.5 MIL/uL — ABNORMAL LOW (ref 3.87–5.11)
RDW: 14.7 % (ref 11.5–15.5)
WBC: 8.6 10*3/uL (ref 4.0–10.5)
nRBC: 0 % (ref 0.0–0.2)

## 2020-09-30 LAB — BASIC METABOLIC PANEL
Anion gap: 7 (ref 5–15)
BUN: 20 mg/dL (ref 8–23)
CO2: 26 mmol/L (ref 22–32)
Calcium: 8.3 mg/dL — ABNORMAL LOW (ref 8.9–10.3)
Chloride: 102 mmol/L (ref 98–111)
Creatinine, Ser: 0.95 mg/dL (ref 0.44–1.00)
GFR, Estimated: >60 mL/min (ref >60)
Glucose, Bld: 118 mg/dL — ABNORMAL HIGH (ref 70–99)
Potassium: 4.6 mmol/L (ref 3.5–5.1)
Sodium: 135 mmol/L (ref 135–145)

## 2020-09-30 LAB — PREALBUMIN: Prealbumin: 20.4 mg/dL (ref 18–38)

## 2020-09-30 LAB — MRSA NEXT GEN BY PCR, NASAL: MRSA by PCR Next Gen: NOT DETECTED

## 2020-09-30 NOTE — Plan of Care (Signed)

## 2020-09-30 NOTE — Consult Note (Addendum)
I have seen and examined the patient. I have personally reviewed the clinical findings, laboratory findings, microbiological data and imaging studies. The assessment and treatment plan was discussed with the  Advance Practice Provider, Janene Madeira.  I agree with her/his recommendations except following additions/corrections.  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 Infection)  Not septic at ED presentation  S/p I and D with patch angioplasty of left common femoral artery( bovine pericardium). OR note with anastomotic Pseudoaneurysm and thrombus. OR cultures pending. There is a plan for repeat OR on Friday for removal of graft.  Continue Vancomycin, pharmacy to dose De-escalate cefepime to ceftriaxone  Blood cultures ( ordered ) Following cultures  Final recommendations to follow pending repeat OR  Rosiland Oz, MD [place] [facility] [place] Infectious [facility]    Date of Admission:  10/12/2020     Total days of antibiotics 1   Vancomycin 8/09 >>  Cefepime 8/09 >>        Reason for Consult: Vascular Graft Infection     Referring Provider: Oneida Alar  Primary Care Provider: Jolinda Croak, MD   Assessment: [name] is a 75 y.o. female admitted from home following significant bleed of the left groin site in the setting of vascular graft infection with operative findings indicating disrupted graft/pseudoaneurysm and  thrombosis. She has been to OR for initial assessment/debridement and control of bleeding but definitive management is planned for Friday with graft excision and reconstruction with Dr. Oneida Alar. Intraoperative cultures from yesterday are without growth preliminarily and negative gram stain, however has been on oral antibiotics prior to admission (Doxy >> omadacycline). Will continue vancomycin for known history of MRSE and cefepime until OR cultures mature to ensure no new pathogen here.  Rifampin contraindicated with her eliquis - also with plans to explant graft Friday.   Will continue to follow cultures remotely    Plan: Continue vancomycin and de-escalate cefepime to ceftriaxone  Follow intraoperative micro Follow SCr (CrCl 37.1)  --> likely will switch to daptomycin for kidney protection Follow up Friday surgery      Active Problems:   Infected prosthetic vascular graft (HCC)    sodium chloride   Intravenous Once   aspirin EC  81 mg Oral Q0600   calcium carbonate  1 tablet Oral Daily   carvedilol  6.25 mg Oral BID   Chlorhexidine Gluconate Cloth  6 each Topical Daily   Chlorhexidine Gluconate Cloth  6 each Topical Daily   citalopram  20 mg Oral Daily   dicyclomine  20 mg Oral TID AC & HS   docusate sodium  100 mg Oral Daily   levothyroxine  112 mcg Oral QAC breakfast   mouth rinse  15 mL Mouth Rinse BID   mirabegron ER  25 mg Oral Daily   mometasone-formoterol  2 puff Inhalation BID   pantoprazole  40 mg Oral Daily   rosuvastatin  20 mg Oral Daily  sodium chloride flush  10-40 mL Intracatheter Q12H   cyanocobalamin  1,000 mcg Oral Daily    HPI: [name] is a 75 y.o. female who came to the ER from home on 8/09 to evaluate bleeding from her left groin.   PMHx detailed below but significant for AOBF grafting.  Recently she developed spontaneous drainage from what seemed to be a superficial abscess/sinus tract that waxed and waned for a while. Eventually taken for  surgery - The sinus tract was excised down further and communicated to the fluid around the dacryon graft material at the base of the incision.  The graft was left in place.  Surgical specimen (1/3) with Gram stain showing gram-positive cocci in pairs cultures growing oxacillin resistant Staph epidermidis/MRSE. Started on Vancomycin >> daptomycin x 6 weeks via picc line through 03/04/20. Rifampin contraindicated with chronic Eliquis requirement. Routine follow up with ID in January after completion of antibiotics revealed improvement, surgical site healed over and flat inflammatory markers.   She was observed off antibiotics at that time and did well up until May 2022 when she developed recurrent swelling and pain surrounding the left groin site with exam concerning for superficial abscess. This again seemed to wax and wane. She has not had any systemic signs of illness and denies any fevers, chills, sweating, malaise. Has been working with Vascular team regarding this site - attempting non-operative approach if possible given her comorbidities and projected difficulty with reconstructing the site following graft explantation and likely course of rehab/recovery to ensue after. LOV with vascular team was 8/05 - she had   She had follow up again with my partner Terri Piedra, NP at [facility] for recurrent sinus tract formation / drainage around the left groin. Nuclear med scan was obtained revealing medium size focus of increased uptake w/in the upper abdomen at the expected location of the proximal aortic BPG. At the time of visit with [name] 09/10/20 Ms. Paper had been placed on doxycycline BID that she was tolerating well. Surgical options have been reviewed with her and she opted for attempt at long term suppressive antibiotics with hopes to avoid any further major surgeries. Her MRSE was resistant to Bactrim and Intermediate to Doxycycline. She was started on Omadacycline, which took a while to get approved through her  insurance. She started this on 8/08 with loading dose of Omadacycline and continued daily dosing up until admission.   Unfortunately the morning of August 9th she developed signfiicant bleeding from the groin site following return of sharp stabbing pain requiring hospitalization and surgical intervention. Dr. Oneida Alar took her to the operating room for I&D - operative findings with active bleeding from anastomotic femoral pseudoaneurysm along medial wall of common femoral anastomosis. Thombosis present in pseudoaneurysm with disruption of the lateral wall of the anastomosis - this was debrided as much as safely possible. Bovine pericardial patch was placed for bleeding. This repair was a temporary fix to stabilize her bleeding - Dr. Oneida Alar has had ongoing discussions with the patient and her family regarding next steps. Rt ax-fem bypass, left ax-pop bpyass and complete removal of AOBF graft. This is scheduled for Friday August 12th.   She has been started on vancomycin and cefepime for the time being while cultures from OR mature. Her only complaint at the time of my visit is soreness to the left groin and desire to go outside.    Review of Systems: Review of Systems  Constitutional:  Negative for chills, fever, malaise/fatigue and weight loss.  HENT:  Negative for sore throat.   Respiratory:  Negative for cough and sputum production.   Cardiovascular:  Negative for chest pain and leg swelling.  Gastrointestinal:  Negative for abdominal pain, diarrhea and vomiting.  Genitourinary:  Negative for dysuria and flank pain.  Musculoskeletal:  Negative for joint pain, myalgias and neck pain.  Skin:  Negative for rash.  Neurological:  Negative for dizziness, tingling and headaches.  Psychiatric/Behavioral:  Negative for depression and substance abuse. The patient is not nervous/anxious and does not have insomnia.    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 Amedeo Plenty   CAROTID STENT  2006   CHOLECYSTECTOMY     COLONOSCOPY N/A 05/08/2013   Procedure: COLONOSCOPY;  Surgeon: Rogene Houston, MD;  Location[facility];  Service: Endoscopy;  Laterality: N/A;  230-moved to [place] notified pt   EYE SURGERY Bilateral    cataract removed lens implanted   INCISION AND DRAINAGE OF WOUND Left 10/15/2020   Procedure: IRRIGATION AND DEBRIDEMENT LEFT GROIN;;  Surgeon: Elam Dutch, MD;  Location: [facility] OR;  Service: Vascular;  Laterality: Left;   PATCH ANGIOPLASTY Left 09/26/2020   Procedure: PATCH ANGIOPLASTY LEFT COMMON FEMORAL ARTERY USING [place];  Surgeon: Elam Dutch, MD;  Location: [facility];  Service: Vascular;  Laterality: Left;   PR VEIN BYPASS GRAFT,AORTO-FEM-POP  2001   WOUND DEBRIDEMENT Left 01/22/2020   Procedure: DEBRIDEMENT LEFT GROIN WOUND;  Surgeon: Rosetta Posner, MD;  Location: [facility] OR;  Service: Vascular;  Laterality: Left;    Social History   Tobacco Use   Smoking status: Every Day    Packs/day: 0.33    Years: 63.00    Pack years: 20.79    Types: Cigarettes   Smokeless tobacco: Never   Tobacco comments:    1 pack every 3 days x 16yr  Vaping Use   Vaping Use: Never used  Substance Use Topics   Alcohol use: No   Drug use: No    Family History  Problem Relation Age of Onset   Cancer Sister    Hypertension Father    Heart attack Mother    Hyperlipidemia Mother    Kidney disease  Brother    Cancer Brother    Cancer Brother        Lung    Colon cancer Neg Hx    Allergies  Allergen Reactions   Penicillins Swelling   Citalopram Other (See Comments)    Nightmares 10/04/2020 Pt states that she is currently taking this drug.   Morphine And Related Nausea And Vomiting   Losartan Nausea Only and Other (See Comments)    Nausea and dizzy. 09/24/2020 Pt states she does not recall having an intolerance to this drug.    OBJECTIVE: Blood  pressure 110/60, pulse (!) 57, temperature 97.8 F (36.6 C), temperature source Oral, resp. rate 14, height 4' 11.02" (1.499 m), weight 45.6 kg, SpO2 98 %.  Physical Exam Vitals reviewed.  Constitutional:      Appearance: She is well-developed.     Comments: Resting comfortably in bed in no distress. Family at the bedside.   HENT:     Mouth/Throat:     Mouth: No oral lesions.     Dentition: Normal dentition. No dental abscesses.     Pharynx: No oropharyngeal exudate.  Neck:     Comments: Cordis in R IJ  Cardiovascular:     Rate and Rhythm: Normal rate and regular rhythm.     Heart sounds: Normal heart sounds.  Pulmonary:     Effort: Pulmonary effort is normal.     Breath sounds: Normal breath sounds.  Abdominal:     General: There is no distension.     Palpations: Abdomen is soft.     Tenderness: There is no abdominal tenderness.  Lymphadenopathy:     Cervical: No cervical adenopathy.  Skin:    General: Skin is warm and dry.     Findings: No rash.  Neurological:     Mental Status: She is alert and oriented to person, place, and time.  Psychiatric:        Judgment: Judgment normal.    Lab Results Lab Results  Component Value Date   WBC 8.6 09/30/2020   HGB 9.3 (L) 09/30/2020   HCT 30.4 (L) 09/30/2020   MCV 86.9 09/30/2020   PLT 216 09/30/2020    Lab Results  Component Value Date   CREATININE 0.88 09/30/2020   BUN 21 09/30/2020   NA 136 09/30/2020   K 4.4 09/30/2020   CL 102 09/30/2020   CO2 27 09/30/2020     Lab Results  Component Value Date   ALT 15 09/30/2020   AST 22 09/30/2020   ALKPHOS 50 09/30/2020   BILITOT 0.5 09/30/2020     Microbiology: Recent Results (from the past 240 hour(s))  SARS CORONAVIRUS 2 (TAT 6-24 HRS) Nasopharyngeal Nasopharyngeal Swab     Status: None   Collection Time: 10/17/2020  1:27 PM   Specimen: Nasopharyngeal Swab  Result Value Ref Range Status   SARS Coronavirus 2 NEGATIVE NEGATIVE Final    Comment: (NOTE) SARS-CoV-2 target nucleic acids are NOT DETECTED.  The SARS-CoV-2 RNA is generally detectable in upper and lower respiratory specimens during the acute phase of infection. Negative results do not preclude SARS-CoV-2 infection, do not rule out co-infections with other pathogens, and should not be used as the sole basis for treatment or other patient management decisions. Negative results must be combined with clinical observations, patient history, and epidemiological information. The expected result is Negative.  Fact Sheet for Patients: SugarRoll.be  Fact Sheet for Healthcare Providers: https://www.woods-mathews.com/  This test is not yet approved or cleared by the [place] FDA and  has been authorized for detection and/or diagnosis of SARS-CoV-2 by FDA under an Emergency Use Authorization (EUA). This EUA will remain  in effect (meaning this test can be used) for the duration of the COVID-19 declaration under Se ction 564(b)(1) of the Act, 21 U.S.C. section 360bbb-3(b)(1), unless the authorization is terminated or revoked sooner.  Performed at [facility], [place] [address], [place], [place] [address]   Aerobic/Anaerobic Culture w Gram Stain (surgical/deep wound)     Status: None (Preliminary result)   Collection Time: 10/11/2020  6:45 PM  Specimen: Groin, Left; Wound  Result Value Ref Range Status   Specimen Description WOUND LEFT GROIN  Final   Special Requests PT ON VANC  Final   Gram  Stain NO WBC SEEN NO ORGANISMS SEEN   Final   Culture   Final    NO GROWTH < 12 HOURS Performed at [facility], [address], [place], [place] [address]    Report Status PENDING  Incomplete  MRSA Next Gen by PCR, Nasal     Status: None   Collection Time: 10/14/2020 10:38 PM   Specimen: Nasal Mucosa; Nasal Swab  Result Value Ref Range Status   MRSA by PCR Next Gen NOT DETECTED NOT DETECTED Final    Comment: (NOTE) The GeneXpert MRSA Assay (FDA approved for NASAL specimens only), is one component of a comprehensive MRSA colonization surveillance program. It is not intended to diagnose MRSA infection nor to guide or monitor treatment for MRSA infections. Test performance is not FDA approved in patients less than 13 years old. Performed at [facility], [place] [address], [place], [place] [address]     Janene Madeira, MSN, NP-C [facility] for Infectious [place] Cell: [phone] Pager: [phone]  09/30/2020 10:05 AM

## 2020-09-30 NOTE — Progress Notes (Signed)
Received from PACU on 4 L [name].Sats>95% on the monitor.Alert and oriented.With dressing in place on L groin with old drainge.Marked by RN in Pacu.Left pedal palpable.Will monitor closely.

## 2020-09-30 NOTE — Progress Notes (Signed)
[facility] [place]  Subjective  -left groin sore   Objective 131/65 (!) 58 97.8 F (36.6 C) (Oral) 12 97%  Intake/Output Summary (Last 24 hours) at 09/30/2020 0744 Last data filed at 09/30/2020 0600 Gross per 24 hour  Intake 2659.95 ml  Output 1635 ml  Net 1024.95 ml   Groin incision dry no hematoma  Assessment/Planning: Status postrepair of left femoral anastomosis with bovine pericardial patch for bleeding.  Patient has aortic graft infection.  Lengthy discussion with the patient her husband and daughter today.  Discussed with them that repair yesterday would probably only be temporary and she would be at very high risk for recurrent bleeding and blowout of this patch.  I believe the definitive operation is going to be a right axillary femoral bypass a left axillary popliteal bypass and complete removal of her aortobifemoral bypass graft.  I discussed with her a 10% mortality from the operation.  I also discussed with her that she would most likely need to go to rehab after the operation.  We also discussed 10 to 20% chance of renal failure, essentially 100% chance of transfusion, possible bowel injury, possible limb loss, possible ventilator dependence and requirement for tracheostomy post operatively.  At this point the patient has opted for removal of the graft.  I will schedule this for Friday, October 02, 2020.  She should remain on bedrest until then.  We will advance her diet today.  She needs SCDs for DVT prophylaxis.  We will start incentive spirometer today to try to improve her lung function some before Friday.  Ruta Hinds 09/30/2020 7:44 AM --  Laboratory Lab Results: Recent Labs    09/27/2020 1226 10/18/2020 1844 09/30/20 0507  WBC 8.5  --  8.6  HGB 10.6* 11.2* 9.3*  HCT 34.6* 33.0* 30.4*  PLT 243  --  216   BMET Recent Labs    09/22/2020 1226 10/17/2020 1844 09/30/20 0507  NA 135 136 135  K 4.4 4.4 4.6  CL 97*  --  102   CO2 29  --  26  GLUCOSE 89  --  118*  BUN 20  --  20  CREATININE 0.92  --  0.95  CALCIUM 9.3  --  8.3*    COAG No results found for: INR, PROTIME No results found for: PTT

## 2020-09-30 NOTE — Progress Notes (Signed)
PHARMACIST LIPID MONITORING   [name] is a 75 y.o. female admitted on 09/26/2020 with infected AFB graft.  Pharmacy has been consulted to optimize lipid-lowering therapy with the indication of secondary prevention for clinical ASCVD.  Recent Labs:  Lipid Panel (last 6 months):   Lab Results  Component Value Date   CHOL 98 09/30/2020   TRIG 104 09/30/2020   HDL 27 (L) 09/30/2020   CHOLHDL 3.6 09/30/2020   VLDL 21 09/30/2020   LDLCALC 50 09/30/2020    Hepatic function panel (last 6 months):   Lab Results  Component Value Date   AST 22 09/30/2020   ALT 15 09/30/2020   ALKPHOS 50 09/30/2020   BILITOT 0.5 09/30/2020    SCr (since admission):   Serum creatinine: 0.88 mg/dL 09/30/20 0758 Estimated creatinine clearance: 37.1 mL/min  Current therapy and lipid therapy tolerance Current lipid-lowering therapy: rosuvastatin 20 mg  Previous lipid-lowering therapies (if applicable): rosuvastatin 20 mg PTA Documented or reported allergies or intolerances to lipid-lowering therapies (if applicable): none  Assessment:   Patient is currently on a high intensity statin with an LDL of 50, which is appropriate.   Plan:   Continue current therapy.  1.Statin intensity (high intensity recommended for all patients regardless of the LDL):  No statin changes. The patient is already on a high intensity statin.  2.Add ezetimibe (if any one of the following):   Not indicated at this time.  3.Refer to lipid clinic:   No  4.Follow-up with:  Primary care provider - Jolinda Croak, MD  5.Follow-up labs after discharge:  No changes in lipid therapy, repeat a lipid panel in one year.       Aleene Davidson, PharmD 09/30/2020, 2:18 PM

## 2020-10-01 ENCOUNTER — Inpatient Hospital Stay (HOSPITAL_COMMUNITY): Payer: Medicare HMO

## 2020-10-01 MED ORDER — DIPHENHYDRAMINE HCL 50 MG/ML IJ SOLN
25.0000 mg | Freq: Four times a day (QID) | INTRAMUSCULAR | Status: DC | PRN
  Administered 2020-10-01 (×2): 25 mg via INTRAVENOUS
  Filled 2020-10-01 (×2): qty 1

## 2020-10-01 MED ORDER — ENSURE ENLIVE PO LIQD
237.0000 mL | Freq: Two times a day (BID) | ORAL | Status: DC
  Administered 2020-10-01: 237 mL via ORAL

## 2020-10-01 MED ORDER — SODIUM CHLORIDE 0.9 % IV SOLN
2.0000 g | INTRAVENOUS | Status: DC
  Administered 2020-10-01 – 2020-10-03 (×2): 2 g via INTRAVENOUS
  Filled 2020-10-01 (×4): qty 20

## 2020-10-01 NOTE — Progress Notes (Addendum)
  Progress Note    10/01/2020 6:41 AM 2 Days Post-Op  Subjective:  says she has some soreness in the left groin.  Nervous about surgery tomorrow.    Afebrile HR 50's SB Q000111Q systolic 0000000 [id number]  Gtts: none  Vitals:   10/01/20 0500 10/01/20 0600  BP: (!) 113/50 (!) 118/51  Pulse: (!) 50 (!) 50  Resp: 12 17  Temp:    SpO2: 97% 99%    Physical Exam: Cardiac:  regular Lungs:  non labored Incisions:  left groin incision bandage is clean Extremities:  palpable DP pulses bilaterally  CBC    Component Value Date/Time   WBC 8.6 09/30/2020 0507   RBC 3.50 (L) 09/30/2020 0507   HGB 9.3 (L) 09/30/2020 0507   HCT 30.4 (L) 09/30/2020 0507   PLT 216 09/30/2020 0507   MCV 86.9 09/30/2020 0507   MCH 26.6 09/30/2020 0507   MCHC 30.6 09/30/2020 0507   RDW 14.7 09/30/2020 0507   LYMPHSABS 1.7 10/01/2020 1226   MONOABS 0.8 09/30/2020 1226   EOSABS 0.4 10/03/2020 1226   BASOSABS 0.1 10/07/2020 1226    BMET    Component Value Date/Time   NA 136 09/30/2020 0758   NA 142 07/03/2019 1116   K 4.4 09/30/2020 0758   CL 102 09/30/2020 0758   CO2 27 09/30/2020 0758   GLUCOSE 103 (H) 09/30/2020 0758   BUN 21 09/30/2020 0758   BUN 21 07/03/2019 1116   CREATININE 0.88 09/30/2020 0758   CALCIUM 8.3 (L) 09/30/2020 0758   GFRNONAA >60 09/30/2020 0758   GFRAA 57 (L) 07/03/2019 1116    INR No results found for: INR   Intake/Output Summary (Last 24 hours) at 10/01/2020 0641 Last data filed at 10/01/2020 0600 Gross per 24 hour  Intake 1998.09 ml  Output 775 ml  Net 1223.09 ml     Assessment/Plan:  75 y.o. female is s/p:   Incision debridement irrigation left groin patch angioplasty left common femoral artery (bovine pericardium)   2 Days Post-Op   -pt doing well this morning with palpable DP pulses bilaterally.  Her left groin bandage is clean without evidence of bleeding.  -appreciate ID consult-continue vanc, changed cefepime to ceftriaxone and blood cx ordered with  final recs pending OR tomorrow -pre-op orders placed, consent and type and screen also ordered.   -DVT prophylaxis:  SCD's    Leontine Locket, PA-C [facility] [phone] 10/01/2020 6:41 AM  Agree with above removal of aortic graft tomorrow left axis right axfem questions answered today.  Ruta Hinds, MD [facility] [place] Office: [phone]

## 2020-10-01 NOTE — H&P (View-Only) (Signed)
  Progress Note    10/01/2020 6:41 AM 2 Days Post-Op  Subjective:  says she has some soreness in the left groin.  Nervous about surgery tomorrow.    Afebrile HR 50's SB Q000111Q systolic 0000000 [id number]  Gtts: none  Vitals:   10/01/20 0500 10/01/20 0600  BP: (!) 113/50 (!) 118/51  Pulse: (!) 50 (!) 50  Resp: 12 17  Temp:    SpO2: 97% 99%    Physical Exam: Cardiac:  regular Lungs:  non labored Incisions:  left groin incision bandage is clean Extremities:  palpable DP pulses bilaterally  CBC    Component Value Date/Time   WBC 8.6 09/30/2020 0507   RBC 3.50 (L) 09/30/2020 0507   HGB 9.3 (L) 09/30/2020 0507   HCT 30.4 (L) 09/30/2020 0507   PLT 216 09/30/2020 0507   MCV 86.9 09/30/2020 0507   MCH 26.6 09/30/2020 0507   MCHC 30.6 09/30/2020 0507   RDW 14.7 09/30/2020 0507   LYMPHSABS 1.7 10/05/2020 1226   MONOABS 0.8 10/12/2020 1226   EOSABS 0.4 10/20/2020 1226   BASOSABS 0.1 10/14/2020 1226    BMET    Component Value Date/Time   NA 136 09/30/2020 0758   NA 142 07/03/2019 1116   K 4.4 09/30/2020 0758   CL 102 09/30/2020 0758   CO2 27 09/30/2020 0758   GLUCOSE 103 (H) 09/30/2020 0758   BUN 21 09/30/2020 0758   BUN 21 07/03/2019 1116   CREATININE 0.88 09/30/2020 0758   CALCIUM 8.3 (L) 09/30/2020 0758   GFRNONAA >60 09/30/2020 0758   GFRAA 57 (L) 07/03/2019 1116    INR No results found for: INR   Intake/Output Summary (Last 24 hours) at 10/01/2020 0641 Last data filed at 10/01/2020 0600 Gross per 24 hour  Intake 1998.09 ml  Output 775 ml  Net 1223.09 ml     Assessment/Plan:  75 y.o. female is s/p:   Incision debridement irrigation left groin patch angioplasty left common femoral artery (bovine pericardium)   2 Days Post-Op   -pt doing well this morning with palpable DP pulses bilaterally.  Her left groin bandage is clean without evidence of bleeding.  -appreciate ID consult-continue vanc, changed cefepime to ceftriaxone and blood cx ordered with  final recs pending OR tomorrow -pre-op orders placed, consent and type and screen also ordered.   -DVT prophylaxis:  SCD's    Leontine Locket, PA-C [facility] [phone] 10/01/2020 6:41 AM  Agree with above removal of aortic graft tomorrow left axis right axfem questions answered today.  Ruta Hinds, MD [facility] [place] Office: [phone]

## 2020-10-01 NOTE — Anesthesia Preprocedure Evaluation (Addendum)
Anesthesia Evaluation  Patient identified by MRN, date of birth, ID band Patient awake    Reviewed: Allergy & Precautions, H&P , NPO status , Patient's Chart, lab work & pertinent test results  Airway Mallampati: II  TM Distance: >3 FB Neck ROM: Full    Dental no notable dental hx. (+) Edentulous Upper, Edentulous Lower, Dental Advisory Given   Pulmonary neg pulmonary ROS, Current Smoker and Patient abstained from smoking.,    Pulmonary exam normal breath sounds clear to auscultation       Cardiovascular Exercise Tolerance: Good hypertension, + Past MI and + Peripheral Vascular Disease   Rhythm:Regular Rate:Normal     Neuro/Psych Anxiety Depression CVA    GI/Hepatic negative GI ROS, Neg liver ROS,   Endo/Other  negative endocrine ROS  Renal/GU negative Renal ROS  negative genitourinary   Musculoskeletal   Abdominal   Peds  Hematology negative hematology ROS (+)   Anesthesia Other Findings   Reproductive/Obstetrics negative OB ROS                            Anesthesia Physical Anesthesia Plan  ASA: 3  Anesthesia Plan: General   Post-op Pain Management:    Induction: Intravenous  PONV Risk Score and Plan: 3 and Ondansetron, Dexamethasone and Treatment may vary due to age or medical condition  Airway Management Planned: Oral ETT  Additional Equipment:   Intra-op Plan:   Post-operative Plan: Possible Post-op intubation/ventilation  Informed Consent: I have reviewed the patients History and Physical, chart, labs and discussed the procedure including the risks, benefits and alternatives for the proposed anesthesia with the patient or authorized representative who has indicated his/her understanding and acceptance.     Dental advisory given  Plan Discussed with: CRNA  Anesthesia Plan Comments:        Anesthesia Quick Evaluation

## 2020-10-01 NOTE — Progress Notes (Signed)
Initial Nutrition Assessment  DOCUMENTATION CODES:   Not applicable  INTERVENTION:   Ensure Enlive po BID, each supplement provides 350 kcal and 20 grams of protein  Downgrade diet to Dysphagia 3 given pt is edentulous, dentures not at hospital   NUTRITION DIAGNOSIS:   Increased nutrient needs related to acute illness as evidenced by estimated needs.   GOAL:   Patient will meet greater than or equal to 90% of their needs   MONITOR:   PO intake, Supplement acceptance, Labs, Weight trends  REASON FOR ASSESSMENT:   Malnutrition Screening Tool    ASSESSMENT:   75 yo female admitted with infected left groin aortobifem. PMH includes HLD, stroke, IBS, PVD  8/09 Debridement and irrigation of left groin patch angioplasty L common fem artery  Noted plan to return to OR on Friday for removal of graft  Pt reports appetite is good. Ate biscuits and gravy from [employer] this AM. Eating some chicken noodle soup and egg salad sandwich for lunch  Weight appears relatively stable.   Pt reports frequent diarrhea with IBS. Reports fried foods, hotdogs, hamburgers, pork exacerbate this.   Pt is edentulous and dentures are at home. Pt with difficulty chewing some hospital foods; plan to downgrade diet to Dysphagia 3.   Labs: reviewed Meds:  bentyl, Vitamin B-12   Diet Order:   Diet Order             DIET DYS 3 Room service appropriate? Yes; Fluid consistency: Thin  Diet effective now                   EDUCATION NEEDS:   Not appropriate for education at this time  Skin:  Skin Assessment: Reviewed RN Assessment  Last BM:  no documented BM  Height:   Ht Readings from Last 1 Encounters:  09/21/2020 4' 11.02" (1.499 m)    Weight:   Wt Readings from Last 1 Encounters:  10/01/20 50.2 kg     BMI:  Body mass index is 22.34 kg/m.  Estimated Nutritional Needs:   Kcal:  1550-1750 kcals  Protein:  78-88 g  Fluid:  >/= 1.6 L   Kerman Passey MS, RDN, LDN,  CNSC Registered Dietitian III Clinical Nutrition RD Pager and On-Call Pager Number Located in [place]

## 2020-10-02 ENCOUNTER — Ambulatory Visit (HOSPITAL_COMMUNITY): Admission: RE | Admit: 2020-10-02 | Payer: Medicare HMO | Source: Ambulatory Visit

## 2020-10-02 ENCOUNTER — Inpatient Hospital Stay (HOSPITAL_COMMUNITY): Payer: Medicare HMO | Admitting: Certified Registered Nurse Anesthetist

## 2020-10-02 ENCOUNTER — Encounter (HOSPITAL_COMMUNITY): Payer: Self-pay | Admitting: Vascular Surgery

## 2020-10-02 ENCOUNTER — Inpatient Hospital Stay (HOSPITAL_COMMUNITY): Payer: Medicare HMO

## 2020-10-02 ENCOUNTER — Encounter (HOSPITAL_COMMUNITY): Admission: EM | Disposition: E | Payer: Self-pay | Source: Home / Self Care | Attending: Vascular Surgery

## 2020-10-02 DIAGNOSIS — Z5189 Encounter for other specified aftercare: Secondary | ICD-10-CM

## 2020-10-02 DIAGNOSIS — E039 Hypothyroidism, unspecified: Secondary | ICD-10-CM | POA: Diagnosis not present

## 2020-10-02 DIAGNOSIS — T827XXA Infection and inflammatory reaction due to other cardiac and vascular devices, implants and grafts, initial encounter: Secondary | ICD-10-CM

## 2020-10-02 DIAGNOSIS — Z452 Encounter for adjustment and management of vascular access device: Secondary | ICD-10-CM | POA: Diagnosis not present

## 2020-10-02 DIAGNOSIS — T82392A Other mechanical complication of femoral arterial graft (bypass), initial encounter: Secondary | ICD-10-CM

## 2020-10-02 DIAGNOSIS — Z978 Presence of other specified devices: Secondary | ICD-10-CM | POA: Diagnosis present

## 2020-10-02 DIAGNOSIS — J9601 Acute respiratory failure with hypoxia: Secondary | ICD-10-CM

## 2020-10-02 HISTORY — PX: PATCH ANGIOPLASTY: SHX6230

## 2020-10-02 HISTORY — PX: FEMORAL-FEMORAL BYPASS GRAFT: SHX936

## 2020-10-02 HISTORY — PX: LAPAROTOMY: SHX154

## 2020-10-02 HISTORY — PX: SMALL BOWEL REPAIR: SHX6447

## 2020-10-02 HISTORY — PX: FEMORAL-POPLITEAL BYPASS GRAFT: SHX937

## 2020-10-02 HISTORY — PX: REMOVAL OF GRAFT: SHX6361

## 2020-10-02 LAB — POCT I-STAT EG7
Acid-base deficit: 8 mmol/L — ABNORMAL HIGH (ref 0.0–2.0)
Bicarbonate: 19.8 mmol/L — ABNORMAL LOW (ref 20.0–28.0)
Calcium, Ion: 1.08 mmol/L — ABNORMAL LOW (ref 1.15–1.40)
HCT: 19 % — ABNORMAL LOW (ref 36.0–46.0)
Hemoglobin: 6.5 g/dL — CL (ref 12.0–15.0)
O2 Saturation: 80 %
Patient temperature: 32.5
Potassium: 4.8 mmol/L (ref 3.5–5.1)
Sodium: 139 mmol/L (ref 135–145)
TCO2: 21 mmol/L — ABNORMAL LOW (ref 22–32)
pCO2, Ven: 43 mmHg — ABNORMAL LOW (ref 44.0–60.0)
pH, Ven: 7.245 — ABNORMAL LOW (ref 7.250–7.430)
pO2, Ven: 41 mmHg (ref 32.0–45.0)

## 2020-10-02 LAB — POCT ACTIVATED CLOTTING TIME
Activated Clotting Time: 173 seconds
Activated Clotting Time: 329 seconds
Activated Clotting Time: 312 seconds
Activated Clotting Time: 121 seconds

## 2020-10-02 LAB — POCT I-STAT 7, (LYTES, BLD GAS, ICA,H+H)
Acid-Base Excess: 0 mmol/L (ref 0.0–2.0)
Acid-Base Excess: 1 mmol/L (ref 0.0–2.0)
Acid-base deficit: 4 mmol/L — ABNORMAL HIGH (ref 0.0–2.0)
Acid-base deficit: 4 mmol/L — ABNORMAL HIGH (ref 0.0–2.0)
Bicarbonate: 26.1 mmol/L (ref 20.0–28.0)
Bicarbonate: 28.4 mmol/L — ABNORMAL HIGH (ref 20.0–28.0)
Bicarbonate: 22.8 mmol/L (ref 20.0–28.0)
Bicarbonate: 21.9 mmol/L (ref 20.0–28.0)
Calcium, Ion: 1.21 mmol/L (ref 1.15–1.40)
Calcium, Ion: 1.2 mmol/L (ref 1.15–1.40)
Calcium, Ion: 1.13 mmol/L — ABNORMAL LOW (ref 1.15–1.40)
Calcium, Ion: 1.07 mmol/L — ABNORMAL LOW (ref 1.15–1.40)
HCT: 26 % — ABNORMAL LOW (ref 36.0–46.0)
HCT: 31 % — ABNORMAL LOW (ref 36.0–46.0)
HCT: 29 % — ABNORMAL LOW (ref 36.0–46.0)
HCT: 26 % — ABNORMAL LOW (ref 36.0–46.0)
Hemoglobin: 8.8 g/dL — ABNORMAL LOW (ref 12.0–15.0)
Hemoglobin: 10.5 g/dL — ABNORMAL LOW (ref 12.0–15.0)
Hemoglobin: 9.9 g/dL — ABNORMAL LOW (ref 12.0–15.0)
Hemoglobin: 8.8 g/dL — ABNORMAL LOW (ref 12.0–15.0)
O2 Saturation: 100 %
O2 Saturation: 99 %
O2 Saturation: 99 %
O2 Saturation: 100 %
Patient temperature: 36.6
Patient temperature: 35
Patient temperature: 33.2
Patient temperature: 32.6
Potassium: 4.1 mmol/L (ref 3.5–5.1)
Potassium: 4.8 mmol/L (ref 3.5–5.1)
Potassium: 4.9 mmol/L (ref 3.5–5.1)
Potassium: 4.5 mmol/L (ref 3.5–5.1)
Sodium: 139 mmol/L (ref 135–145)
Sodium: 137 mmol/L (ref 135–145)
Sodium: 136 mmol/L (ref 135–145)
Sodium: 139 mmol/L (ref 135–145)
TCO2: 28 mmol/L (ref 22–32)
TCO2: 30 mmol/L (ref 22–32)
TCO2: 24 mmol/L (ref 22–32)
TCO2: 23 mmol/L (ref 22–32)
pCO2 arterial: 47.3 mmHg (ref 32.0–48.0)
pCO2 arterial: 51.2 mmHg — ABNORMAL HIGH (ref 32.0–48.0)
pCO2 arterial: 43.5 mmHg (ref 32.0–48.0)
pCO2 arterial: 34.1 mmHg (ref 32.0–48.0)
pH, Arterial: 7.348 — ABNORMAL LOW (ref 7.350–7.450)
pH, Arterial: 7.342 — ABNORMAL LOW (ref 7.350–7.450)
pH, Arterial: 7.307 — ABNORMAL LOW (ref 7.350–7.450)
pH, Arterial: 7.395 (ref 7.350–7.450)
pO2, Arterial: 229 mmHg — ABNORMAL HIGH (ref 83.0–108.0)
pO2, Arterial: 127 mmHg — ABNORMAL HIGH (ref 83.0–108.0)
pO2, Arterial: 133 mmHg — ABNORMAL HIGH (ref 83.0–108.0)
pO2, Arterial: 385 mmHg — ABNORMAL HIGH (ref 83.0–108.0)

## 2020-10-02 LAB — POCT I-STAT, CHEM 8
BUN: 15 mg/dL (ref 8–23)
Calcium, Ion: 1.12 mmol/L — ABNORMAL LOW (ref 1.15–1.40)
Chloride: 104 mmol/L (ref 98–111)
Creatinine, Ser: 0.6 mg/dL (ref 0.44–1.00)
Glucose, Bld: 160 mg/dL — ABNORMAL HIGH (ref 70–99)
HCT: 31 % — ABNORMAL LOW (ref 36.0–46.0)
Hemoglobin: 10.5 g/dL — ABNORMAL LOW (ref 12.0–15.0)
Potassium: 4.7 mmol/L (ref 3.5–5.1)
Sodium: 136 mmol/L (ref 135–145)
TCO2: 26 mmol/L (ref 22–32)

## 2020-10-02 LAB — COMPREHENSIVE METABOLIC PANEL
ALT: 13 U/L (ref 0–44)
AST: 17 U/L (ref 15–41)
Albumin: 2.4 g/dL — ABNORMAL LOW (ref 3.5–5.0)
Alkaline Phosphatase: 48 U/L (ref 38–126)
Anion gap: 3 — ABNORMAL LOW (ref 5–15)
BUN: 14 mg/dL (ref 8–23)
CO2: 25 mmol/L (ref 22–32)
Calcium: 8 mg/dL — ABNORMAL LOW (ref 8.9–10.3)
Chloride: 108 mmol/L (ref 98–111)
Creatinine, Ser: 0.91 mg/dL (ref 0.44–1.00)
GFR, Estimated: >60 mL/min (ref >60)
Glucose, Bld: 99 mg/dL (ref 70–99)
Potassium: 4.5 mmol/L (ref 3.5–5.1)
Sodium: 136 mmol/L (ref 135–145)
Total Bilirubin: 0.4 mg/dL (ref 0.3–1.2)
Total Protein: 4.9 g/dL — ABNORMAL LOW (ref 6.5–8.1)

## 2020-10-02 LAB — GRAM STAIN

## 2020-10-02 LAB — CBC
HCT: 27.7 % — ABNORMAL LOW (ref 36.0–46.0)
Hemoglobin: 8.4 g/dL — ABNORMAL LOW (ref 12.0–15.0)
MCH: 27.3 pg (ref 26.0–34.0)
MCHC: 30.3 g/dL (ref 30.0–36.0)
MCV: 89.9 fL (ref 80.0–100.0)
Platelets: 159 10*3/uL (ref 150–400)
RBC: 3.08 MIL/uL — ABNORMAL LOW (ref 3.87–5.11)
RDW: 15 % (ref 11.5–15.5)
WBC: 11.5 10*3/uL — ABNORMAL HIGH (ref 4.0–10.5)
nRBC: 0 % (ref 0.0–0.2)

## 2020-10-02 LAB — BASIC METABOLIC PANEL
Anion gap: 9 (ref 5–15)
BUN: 14 mg/dL (ref 8–23)
CO2: 19 mmol/L — ABNORMAL LOW (ref 22–32)
Calcium: 7.3 mg/dL — ABNORMAL LOW (ref 8.9–10.3)
Chloride: 106 mmol/L (ref 98–111)
Creatinine, Ser: 1.02 mg/dL — ABNORMAL HIGH (ref 0.44–1.00)
GFR, Estimated: 57 mL/min — ABNORMAL LOW (ref >60)
Glucose, Bld: 203 mg/dL — ABNORMAL HIGH (ref 70–99)
Potassium: 4.4 mmol/L (ref 3.5–5.1)
Sodium: 134 mmol/L — ABNORMAL LOW (ref 135–145)

## 2020-10-02 LAB — PROTIME-INR
INR: 1.5 — ABNORMAL HIGH (ref 0.8–1.2)
Prothrombin Time: 18.3 seconds — ABNORMAL HIGH (ref 11.4–15.2)

## 2020-10-02 LAB — GLUCOSE, CAPILLARY
Glucose-Capillary: 193 mg/dL — ABNORMAL HIGH (ref 70–99)
Glucose-Capillary: 196 mg/dL — ABNORMAL HIGH (ref 70–99)

## 2020-10-02 LAB — MAGNESIUM: Magnesium: 1.4 mg/dL — ABNORMAL LOW (ref 1.7–2.4)

## 2020-10-02 LAB — APTT: aPTT: 45 seconds — ABNORMAL HIGH (ref 24–36)

## 2020-10-02 LAB — PREPARE RBC (CROSSMATCH)

## 2020-10-02 SURGERY — REMOVAL, GRAFT
Anesthesia: General | Site: Ureter | Laterality: Right

## 2020-10-02 MED ORDER — ORAL CARE MOUTH RINSE
15.0000 mL | OROMUCOSAL | Status: DC
  Administered 2020-10-02 – 2020-10-04 (×20): 15 mL via OROMUCOSAL

## 2020-10-02 MED ORDER — LEVOTHYROXINE SODIUM 100 MCG/5ML IV SOLN
50.0000 ug | Freq: Every day | INTRAVENOUS | Status: DC
  Administered 2020-10-03 – 2020-10-07 (×5): 50 ug via INTRAVENOUS
  Filled 2020-10-02 (×6): qty 5

## 2020-10-02 MED ORDER — HEPARIN SODIUM (PORCINE) 1000 UNIT/ML IJ SOLN
INTRAMUSCULAR | Status: DC | PRN
  Administered 2020-10-02: 6000 [IU] via INTRAVENOUS
  Administered 2020-10-02: 3000 [IU] via INTRAVENOUS
  Administered 2020-10-02 (×2): 6000 [IU] via INTRAVENOUS

## 2020-10-02 MED ORDER — LACTATED RINGERS IV SOLN: INTRAVENOUS | Status: DC | PRN

## 2020-10-02 MED ORDER — PHENYLEPHRINE 40 MCG/ML (10ML) SYRINGE FOR IV PUSH (FOR BLOOD PRESSURE SUPPORT)
PREFILLED_SYRINGE | INTRAVENOUS | Status: DC | PRN
  Administered 2020-10-02: 40 ug via INTRAVENOUS
  Administered 2020-10-02: 80 ug via INTRAVENOUS

## 2020-10-02 MED ORDER — ONDANSETRON HCL 4 MG/2ML IJ SOLN
INTRAMUSCULAR | Status: AC
  Filled 2020-10-02: qty 2

## 2020-10-02 MED ORDER — HEPARIN SODIUM (PORCINE) 1000 UNIT/ML IJ SOLN
INTRAMUSCULAR | Status: AC
  Filled 2020-10-02: qty 1

## 2020-10-02 MED ORDER — FENTANYL CITRATE (PF) 250 MCG/5ML IJ SOLN
INTRAMUSCULAR | Status: AC
  Filled 2020-10-02: qty 5

## 2020-10-02 MED ORDER — DEXAMETHASONE SODIUM PHOSPHATE 10 MG/ML IJ SOLN
INTRAMUSCULAR | Status: AC
  Filled 2020-10-02: qty 1

## 2020-10-02 MED ORDER — PROTAMINE SULFATE 10 MG/ML IV SOLN
INTRAVENOUS | Status: AC
  Filled 2020-10-02: qty 15

## 2020-10-02 MED ORDER — PHENYLEPHRINE HCL-NACL 20-0.9 MG/250ML-% IV SOLN
INTRAVENOUS | Status: DC | PRN
  Administered 2020-10-02: 25 ug/min via INTRAVENOUS

## 2020-10-02 MED ORDER — FENTANYL CITRATE (PF) 250 MCG/5ML IJ SOLN
INTRAMUSCULAR | Status: DC | PRN
  Administered 2020-10-02 (×3): 50 ug via INTRAVENOUS
  Administered 2020-10-02: 25 ug via INTRAVENOUS
  Administered 2020-10-02 (×2): 50 ug via INTRAVENOUS
  Administered 2020-10-02 (×2): 100 ug via INTRAVENOUS
  Administered 2020-10-02: 25 ug via INTRAVENOUS

## 2020-10-02 MED ORDER — ROCURONIUM BROMIDE 10 MG/ML (PF) SYRINGE
PREFILLED_SYRINGE | INTRAVENOUS | Status: DC | PRN
  Administered 2020-10-02: 50 mg via INTRAVENOUS
  Administered 2020-10-02: 30 mg via INTRAVENOUS
  Administered 2020-10-02: 40 mg via INTRAVENOUS
  Administered 2020-10-02: 30 mg via INTRAVENOUS
  Administered 2020-10-02: 50 mg via INTRAVENOUS

## 2020-10-02 MED ORDER — SODIUM CHLORIDE 0.9 % IV SOLN
INTRAVENOUS | Status: DC | PRN
  Administered 2020-10-02 – 2020-10-08 (×4): 500 mL via INTRAVENOUS

## 2020-10-02 MED ORDER — CHLORHEXIDINE GLUCONATE 0.12% ORAL RINSE (MEDLINE KIT)
15.0000 mL | Freq: Two times a day (BID) | OROMUCOSAL | Status: DC
  Administered 2020-10-02 – 2020-10-04 (×4): 15 mL via OROMUCOSAL

## 2020-10-02 MED ORDER — PHENYLEPHRINE 40 MCG/ML (10ML) SYRINGE FOR IV PUSH (FOR BLOOD PRESSURE SUPPORT)
PREFILLED_SYRINGE | INTRAVENOUS | Status: AC
  Filled 2020-10-02: qty 10

## 2020-10-02 MED ORDER — PROTAMINE SULFATE 10 MG/ML IV SOLN
INTRAVENOUS | Status: AC
  Filled 2020-10-02: qty 5

## 2020-10-02 MED ORDER — LIDOCAINE 2% (20 MG/ML) 5 ML SYRINGE
INTRAMUSCULAR | Status: AC
  Filled 2020-10-02: qty 5

## 2020-10-02 MED ORDER — ONDANSETRON HCL 4 MG/2ML IJ SOLN
INTRAMUSCULAR | Status: DC | PRN
Start: 2020-10-02 — End: 2020-10-02
  Administered 2020-10-02: 4 mg via INTRAVENOUS

## 2020-10-02 MED ORDER — ROCURONIUM BROMIDE 10 MG/ML (PF) SYRINGE
PREFILLED_SYRINGE | INTRAVENOUS | Status: AC
  Filled 2020-10-02: qty 10

## 2020-10-02 MED ORDER — 0.9 % SODIUM CHLORIDE (POUR BTL) OPTIME
TOPICAL | Status: DC | PRN
  Administered 2020-10-02 (×3): 1000 mL
  Administered 2020-10-02: 2000 mL

## 2020-10-02 MED ORDER — METOPROLOL TARTRATE 5 MG/5ML IV SOLN: 5.0000 mg | Freq: Four times a day (QID) | INTRAVENOUS | Status: DC

## 2020-10-02 MED ORDER — SODIUM CHLORIDE 0.9 % IV SOLN: INTRAVENOUS | Status: DC

## 2020-10-02 MED ORDER — FENTANYL CITRATE (PF) 100 MCG/2ML IJ SOLN: 25.0000 ug | INTRAMUSCULAR | Status: DC | PRN

## 2020-10-02 MED ORDER — EPHEDRINE 5 MG/ML INJ
INTRAVENOUS | Status: AC
  Filled 2020-10-02: qty 10

## 2020-10-02 MED ORDER — PROPOFOL 10 MG/ML IV BOLUS
INTRAVENOUS | Status: DC | PRN
  Administered 2020-10-02: 50 mg via INTRAVENOUS

## 2020-10-02 MED ORDER — EPHEDRINE SULFATE 50 MG/ML IJ SOLN
INTRAMUSCULAR | Status: DC | PRN
  Administered 2020-10-02 (×8): 5 mg via INTRAVENOUS

## 2020-10-02 MED ORDER — DEXTROSE 5 % IV SOLN
INTRAVENOUS | Status: DC | PRN
  Administered 2020-10-02: 2 g via INTRAVENOUS

## 2020-10-02 MED ORDER — INSULIN ASPART 100 UNIT/ML IJ SOLN
0.0000 [IU] | INTRAMUSCULAR | Status: DC
  Administered 2020-10-02 – 2020-10-03 (×6): 2 [IU] via SUBCUTANEOUS
  Administered 2020-10-03: 1 [IU] via SUBCUTANEOUS
  Administered 2020-10-06: 2 [IU] via SUBCUTANEOUS
  Administered 2020-10-06 (×4): 1 [IU] via SUBCUTANEOUS
  Administered 2020-10-07 (×2): 2 [IU] via SUBCUTANEOUS
  Administered 2020-10-07: 1 [IU] via SUBCUTANEOUS
  Administered 2020-10-07: 3 [IU] via SUBCUTANEOUS
  Administered 2020-10-07 – 2020-10-08 (×2): 2 [IU] via SUBCUTANEOUS

## 2020-10-02 MED ORDER — PROPOFOL 10 MG/ML IV BOLUS
INTRAVENOUS | Status: AC
  Filled 2020-10-02: qty 20

## 2020-10-02 MED ORDER — HYDRALAZINE HCL 20 MG/ML IJ SOLN: 5.0000 mg | INTRAMUSCULAR | Status: DC | PRN

## 2020-10-02 MED ORDER — HEPARIN 6000 UNIT IRRIGATION SOLUTION
Status: DC | PRN
  Administered 2020-10-02: 1

## 2020-10-02 MED ORDER — ALBUMIN HUMAN 5 % IV SOLN: INTRAVENOUS | Status: DC | PRN

## 2020-10-02 MED ORDER — MAGNESIUM SULFATE 4 GM/100ML IV SOLN
4.0000 g | Freq: Once | INTRAVENOUS | Status: AC
  Administered 2020-10-02: 4 g via INTRAVENOUS
  Filled 2020-10-02: qty 100

## 2020-10-02 MED ORDER — FENTANYL BOLUS VIA INFUSION
25.0000 ug | INTRAVENOUS | Status: DC | PRN
  Administered 2020-10-04: 25 ug via INTRAVENOUS
  Filled 2020-10-02: qty 100

## 2020-10-02 MED ORDER — SODIUM CHLORIDE 0.9 % IV SOLN: INTRAVENOUS | Status: DC | PRN

## 2020-10-02 MED ORDER — FENTANYL 2500MCG IN NS 250ML (10MCG/ML) PREMIX INFUSION
25.0000 ug/h | INTRAVENOUS | Status: DC
  Administered 2020-10-02: 25 ug/h via INTRAVENOUS
  Administered 2020-10-03: 125 ug/h via INTRAVENOUS
  Filled 2020-10-02 (×2): qty 250

## 2020-10-02 MED ORDER — SODIUM CHLORIDE 0.9% IV SOLUTION: Freq: Once | INTRAVENOUS | Status: DC

## 2020-10-02 MED ORDER — ARFORMOTEROL TARTRATE 15 MCG/2ML IN NEBU
15.0000 ug | INHALATION_SOLUTION | Freq: Two times a day (BID) | RESPIRATORY_TRACT | Status: DC
  Administered 2020-10-02 – 2020-10-08 (×12): 15 ug via RESPIRATORY_TRACT
  Filled 2020-10-02 (×12): qty 2

## 2020-10-02 MED ORDER — DEXAMETHASONE SODIUM PHOSPHATE 10 MG/ML IJ SOLN
INTRAMUSCULAR | Status: DC | PRN
  Administered 2020-10-02: 10 mg via INTRAVENOUS

## 2020-10-02 MED ORDER — PROPOFOL 1000 MG/100ML IV EMUL
0.0000 ug/kg/min | INTRAVENOUS | Status: DC
Start: 2020-10-02 — End: 2020-10-05
  Administered 2020-10-02: 5 ug/kg/min via INTRAVENOUS
  Administered 2020-10-03: 30 ug/kg/min via INTRAVENOUS
  Administered 2020-10-03: 20 ug/kg/min via INTRAVENOUS
  Filled 2020-10-02 (×3): qty 100

## 2020-10-02 MED ORDER — BUDESONIDE 0.25 MG/2ML IN SUSP
0.2500 mg | Freq: Two times a day (BID) | RESPIRATORY_TRACT | Status: DC
  Administered 2020-10-02 – 2020-10-08 (×12): 0.25 mg via RESPIRATORY_TRACT
  Filled 2020-10-02 (×12): qty 2

## 2020-10-02 MED ORDER — SODIUM CHLORIDE 0.9 % IV SOLN
500.0000 mL | Freq: Once | INTRAVENOUS | Status: AC | PRN
  Administered 2020-10-03: 500 mL via INTRAVENOUS

## 2020-10-02 MED ORDER — HEPARIN 6000 UNIT IRRIGATION SOLUTION
Status: AC
  Filled 2020-10-02: qty 500

## 2020-10-02 MED ORDER — PANTOPRAZOLE SODIUM 40 MG IV SOLR
40.0000 mg | Freq: Every day | INTRAVENOUS | Status: DC
  Administered 2020-10-02 – 2020-10-08 (×6): 40 mg via INTRAVENOUS
  Filled 2020-10-02 (×6): qty 40

## 2020-10-02 MED ORDER — HYDROMORPHONE HCL 1 MG/ML IJ SOLN
1.0000 mg | INTRAMUSCULAR | Status: DC | PRN
  Administered 2020-10-04 – 2020-10-06 (×5): 1 mg via INTRAVENOUS
  Filled 2020-10-02 (×5): qty 1

## 2020-10-02 MED ORDER — PHENYLEPHRINE CONCENTRATED 100MG/250ML (0.4 MG/ML) INFUSION SIMPLE
0.0000 ug/min | INTRAVENOUS | Status: DC
Start: 2020-10-02 — End: 2020-10-08
  Administered 2020-10-02: 10 ug/min via INTRAVENOUS
  Administered 2020-10-03: 130 ug/min via INTRAVENOUS
  Administered 2020-10-04: 120 ug/min via INTRAVENOUS
  Administered 2020-10-04: 130 ug/min via INTRAVENOUS
  Administered 2020-10-05: 120 ug/min via INTRAVENOUS
  Administered 2020-10-05: 140 ug/min via INTRAVENOUS
  Administered 2020-10-06: 70 ug/min via INTRAVENOUS
  Filled 2020-10-02 (×10): qty 250

## 2020-10-02 MED ORDER — PROTAMINE SULFATE 10 MG/ML IV SOLN
INTRAVENOUS | Status: DC | PRN
  Administered 2020-10-02 (×2): 20 mg via INTRAVENOUS
  Administered 2020-10-02: 10 mg via INTRAVENOUS
  Administered 2020-10-02: 60 mg via INTRAVENOUS

## 2020-10-02 SURGICAL SUPPLY — 111 items
ADH SKN CLS APL DERMABOND .7 (GAUZE/BANDAGES/DRESSINGS)
AGENT HMST SPONGE THK3/8 (HEMOSTASIS)
BAG COUNTER SPONGE SURGICOUNT (BAG) ×6 IMPLANT
BAG ISL DRAPE 18X18 STRL (DRAPES) ×5
BAG ISOLATION DRAPE 18X18 (DRAPES) ×5 IMPLANT
BAG SPNG CNTER NS LX DISP (BAG) ×5
BANDAGE ESMARK 6X9 LF (GAUZE/BANDAGES/DRESSINGS) IMPLANT
BIOPATCH RED 1 DISK 7.0 (GAUZE/BANDAGES/DRESSINGS) ×12 IMPLANT
BLANKET WARM CARDIAC ADLT BAIR (MISCELLANEOUS) ×6 IMPLANT
BNDG CMPR 9X6 STRL LF SNTH (GAUZE/BANDAGES/DRESSINGS)
BNDG ESMARK 6X9 LF (GAUZE/BANDAGES/DRESSINGS)
CANISTER SUCT 3000ML PPV (MISCELLANEOUS) ×6 IMPLANT
CANNULA VESSEL 3MM 2 BLNT TIP (CANNULA) ×12 IMPLANT
CATH EMB 4FR 80CM (CATHETERS) ×6 IMPLANT
CLIP VESOCCLUDE MED 24/CT (CLIP) ×6 IMPLANT
CLIP VESOCCLUDE SM WIDE 24/CT (CLIP) ×6 IMPLANT
CORONARY SUCKER SOFT TIP 10052 (MISCELLANEOUS) IMPLANT
CUFF TOURN SGL QUICK 24 (TOURNIQUET CUFF)
CUFF TOURN SGL QUICK 34 (TOURNIQUET CUFF)
CUFF TOURN SGL QUICK 42 (TOURNIQUET CUFF) IMPLANT
CUFF TRNQT CYL 24X4X16.5-23 (TOURNIQUET CUFF) IMPLANT
CUFF TRNQT CYL 34X4.125X (TOURNIQUET CUFF) IMPLANT
Contour Soft Percuflex Material 6F x 26cm ×6 IMPLANT
DECANTER SPIKE VIAL GLASS SM (MISCELLANEOUS) IMPLANT
DERMABOND ADVANCED (GAUZE/BANDAGES/DRESSINGS)
DERMABOND ADVANCED .7 DNX12 (GAUZE/BANDAGES/DRESSINGS) IMPLANT
DRAIN HEMOVAC 1/8 X 5 (WOUND CARE) ×6 IMPLANT
DRAIN JACKSON PRATT 10MM FLAT (MISCELLANEOUS) ×6 IMPLANT
DRAPE HALF SHEET 40X57 (DRAPES) ×12 IMPLANT
DRAPE INCISE IOBAN 66X45 STRL (DRAPES) ×12 IMPLANT
DRAPE ISOLATION BAG 18X18 (DRAPES) ×6
DRAPE X-RAY CASS 24X20 (DRAPES) IMPLANT
DRSG COVADERM 4X14 (GAUZE/BANDAGES/DRESSINGS) ×6 IMPLANT
DRSG COVADERM 4X6 (GAUZE/BANDAGES/DRESSINGS) ×12 IMPLANT
DRSG COVADERM 4X8 (GAUZE/BANDAGES/DRESSINGS) ×24 IMPLANT
DRSG OPSITE 6X11 MED (GAUZE/BANDAGES/DRESSINGS) ×6 IMPLANT
ELECT BLADE 4.0 EZ CLEAN MEGAD (MISCELLANEOUS) ×12
ELECT REM PT RETURN 9FT ADLT (ELECTROSURGICAL) ×6
ELECTRODE BLDE 4.0 EZ CLN MEGD (MISCELLANEOUS) ×10 IMPLANT
ELECTRODE REM PT RTRN 9FT ADLT (ELECTROSURGICAL) ×5 IMPLANT
EVACUATOR SILICONE 100CC (DRAIN) ×12 IMPLANT
FELT TEFLON 1X6 (MISCELLANEOUS) ×6 IMPLANT
GAUZE SPONGE 4X4 12PLY STRL (GAUZE/BANDAGES/DRESSINGS) ×6 IMPLANT
GLOVE SURG ENC MOIS LTX SZ7.5 (GLOVE) ×6 IMPLANT
GOWN STRL REUS W/ TWL LRG LVL3 (GOWN DISPOSABLE) ×15 IMPLANT
GOWN STRL REUS W/TWL LRG LVL3 (GOWN DISPOSABLE) ×18
GRAFT CV 60X8STRG TUBE KNTD (Vascular Products) ×5 IMPLANT
GRAFT HEMASHIELD 8MM (Vascular Products) ×6 IMPLANT
GRAFT PROPATEN W/RING 8X80X70 (Vascular Products) ×6 IMPLANT
GUIDEWIRE STR DUAL SENSOR (WIRE) ×6 IMPLANT
HEMOSTAT SPONGE AVITENE ULTRA (HEMOSTASIS) IMPLANT
INSERT FOGARTY 61MM (MISCELLANEOUS) ×12 IMPLANT
KIT BASIN OR (CUSTOM PROCEDURE TRAY) ×6 IMPLANT
KIT TURNOVER KIT B (KITS) ×6 IMPLANT
LOOP VESSEL MAXI BLUE (MISCELLANEOUS) ×12 IMPLANT
LOOP VESSEL MINI RED (MISCELLANEOUS) ×12 IMPLANT
NS IRRIG 1000ML POUR BTL (IV SOLUTION) ×12 IMPLANT
PACK PERIPHERAL VASCULAR (CUSTOM PROCEDURE TRAY) ×6 IMPLANT
PAD ARMBOARD 7.5X6 YLW CONV (MISCELLANEOUS) ×12 IMPLANT
PENCIL BUTTON HOLSTER BLD 10FT (ELECTRODE) ×6 IMPLANT
PUNCH AORTIC ROTATE 4.0MM (MISCELLANEOUS) ×6 IMPLANT
RETAINER VISCERA MED (MISCELLANEOUS) ×6 IMPLANT
SET COLLECT BLD 21X3/4 12 (NEEDLE) IMPLANT
SPONGE DRAIN TRACH 4X4 STRL 2S (GAUZE/BANDAGES/DRESSINGS) ×12 IMPLANT
SPONGE INTESTINAL PEANUT (DISPOSABLE) ×6 IMPLANT
SPONGE SURGIFOAM ABS GEL 100 (HEMOSTASIS) IMPLANT
SPONGE T-LAP 18X18 ~~LOC~~+RFID (SPONGE) ×18 IMPLANT
STAPLER VISISTAT 35W (STAPLE) ×18 IMPLANT
STENT URET 6FRX26 CONTOUR (STENTS) ×6 IMPLANT
STOPCOCK 4 WAY LG BORE MALE ST (IV SETS) ×6 IMPLANT
SUT ETHILON 2 0 PSLX (SUTURE) ×12 IMPLANT
SUT PDS AB 1 TP1 54 (SUTURE) ×12 IMPLANT
SUT PROLENE 3 0 SH DA (SUTURE) ×18 IMPLANT
SUT PROLENE 5 0 C 1 24 (SUTURE) ×78 IMPLANT
SUT PROLENE 5 0 C 1 36 (SUTURE) ×12 IMPLANT
SUT PROLENE 6 0 C 1 24 (SUTURE) ×24 IMPLANT
SUT PROLENE 6 0 CC (SUTURE) ×24 IMPLANT
SUT PROLENE 7 0 BV 1 (SUTURE) IMPLANT
SUT PROLENE 7 0 BV1 MDA (SUTURE) IMPLANT
SUT SILK 0 TIES 10X30 (SUTURE) ×6 IMPLANT
SUT SILK 2 0 (SUTURE) ×6
SUT SILK 2 0 PERMA HAND 18 BK (SUTURE) ×12 IMPLANT
SUT SILK 2 0 SH (SUTURE) ×18 IMPLANT
SUT SILK 2 0 TIES 10X30 (SUTURE) ×6 IMPLANT
SUT SILK 2-0 18XBRD TIE 12 (SUTURE) ×5 IMPLANT
SUT SILK 3 0 (SUTURE) ×6
SUT SILK 3 0 SH CR/8 (SUTURE) ×12 IMPLANT
SUT SILK 3 0 TIES 10X30 (SUTURE) ×6 IMPLANT
SUT SILK 3-0 18XBRD TIE 12 (SUTURE) ×5 IMPLANT
SUT VIC AB 2-0 CT1 27 (SUTURE) ×18
SUT VIC AB 2-0 CT1 TAPERPNT 27 (SUTURE) ×15 IMPLANT
SUT VIC AB 2-0 CTX 36 (SUTURE) ×12 IMPLANT
SUT VIC AB 2-0 SH 27 (SUTURE) ×12
SUT VIC AB 2-0 SH 27XBRD (SUTURE) ×10 IMPLANT
SUT VIC AB 3-0 SH 27 (SUTURE) ×48
SUT VIC AB 3-0 SH 27X BRD (SUTURE) ×40 IMPLANT
SUT VIC AB 4-0 PS2 27 (SUTURE) ×12 IMPLANT
SUT VIC AB 4-0 SH 27 (SUTURE) ×24
SUT VIC AB 4-0 SH 27XBRD (SUTURE) ×20 IMPLANT
SUT VICRYL 4-0 PS2 18IN ABS (SUTURE) ×12 IMPLANT
SWAB COLLECTION DEVICE MRSA (MISCELLANEOUS) ×6 IMPLANT
SWAB CULTURE ESWAB REG 1ML (MISCELLANEOUS) ×6 IMPLANT
SYR 3ML LL SCALE MARK (SYRINGE) ×6 IMPLANT
TAPE UMBILICAL COTTON 1/8X30 (MISCELLANEOUS) ×6 IMPLANT
TOWEL GREEN STERILE (TOWEL DISPOSABLE) ×6 IMPLANT
TOWEL ~~LOC~~+RFID 17X26 BLUE (SPONGE) ×6 IMPLANT
TRAY FOLEY MTR SLVR 16FR STAT (SET/KITS/TRAYS/PACK) ×6 IMPLANT
TUBE CONNECTING 20X1/4 (TUBING) ×6 IMPLANT
TUBING EXTENTION W/L.L. (IV SETS) IMPLANT
UNDERPAD 30X36 HEAVY ABSORB (UNDERPADS AND DIAPERS) ×6 IMPLANT
WATER STERILE IRR 1000ML POUR (IV SOLUTION) ×6 IMPLANT

## 2020-10-02 NOTE — Consult Note (Addendum)
NAME:  [name], MRN:  [id number], DOB:  [birth year], LOS: 3 ADMISSION DATE:  10/19/2020, CONSULTATION DATE:  8/12 REFERRING MD:  Dr. Oneida Alar, CHIEF COMPLAINT:  removal of infected aorta bi-femoral bypass graft; placement of R axillay BI-FEMORAL BYPASS GRAFT and LEFT AXILLARY POPLITEAL BYPASS GRAFT   History of Present Illness:  Patient is a 75 yo F w/ pertinent PMH of carotid artery occlusion, CVA, hyper otitis, HTN, HLD, hypothyroidism, IBS, hydronephrosis, COPD, MI, PVD, PAF, aortobifemoral bypass graft 10 years ago presents to [facility] on 09/26/2020 with left femoral stent bleeding and infection.  Patient has been on Omadacycline for 2 days prior to admission for infection and has recently noticed profuse bleeding from the site. Has had I&D of left groin in December 2021. Bleeding was stopped and EMS called to bring to [facility] on 8/9. Last dose of Eliquis for PAF on 8/8. Hgb and bp stable. Vascular surgery consulted. On 8/9 Status postrepair of left femoral anastomosis with bovine pericardial patch for bleeding surgery performed. 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 Started on Vanc/cefepime. Infectious disease consulted 8/10. On Vanc and ceftriaxone.  On 8/12, Vascular surgery  performed: REMOVAL OF AORTA BI-FEMORAL BYPASS GRAFT, placed RIGTH AXILLARY BI-FEMORAL BYPASS GRAFT and LEFT AXILLARY POPLITEAL BYPASS GRAFT. There was small serosal injury to duodenum s/p procedure. General Surgery consulted. Patched with omentum and left NG tube with drain. Urology placed Right ureteroureterostomy with ureteral stent placement/exchange for complete mid ureteral transection. Patient arrived to ICU intubated.  PCCM consulted for management of mechanical ventilation and medical management.  Pertinent  Medical History   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   Cerebrovascular occlusion 2001   Extracranial with Hx of TIA's   Depression    Hepatitis     Hyperlipidemia    IBS (irritable bowel syndrome)    Myocardial infarction [facility])    Peripheral vascular disease ([place])    had Carotid En   Rheumatic fever    Stroke ([place])    no residual- except left side of face has a small "twist"   Thyroid disease    Hypothyroidism     Significant Hospital Events: Including procedures, antibiotic start and stop dates in addition to other pertinent events   8/12: pccm consulted for management of mechanical ventilation s/p surgery placement of RIGHT axillay BI-FEMORAL BYPASS GRAFT and LEFT AXILLARY POPLITEAL BYPASS GRAFT   Interim History / Subjective:  Patient intubated and sedate on PRVC 50% 5 peep; sats 99% BP stable; nurse just turned off neo NG in place; foley in place with minimal bloody output Temperature 33 degrees celsius. Bear hugger in place 2 JP drains in place  Objective   Blood pressure 133/77, pulse 69, temperature (!) 91 F (32.8 C), temperature source Esophageal, resp. rate 18, height 4' 11.02" (1.499 m), weight 50.2 kg, SpO2 100 %.    Vent Mode: PRVC FiO2 (%):  [50 %-100 %] 50 % Set Rate:  [20 bmp] 20 bmp Vt Set:  [450 mL] 450 mL PEEP:  [5 cmH20] 5 cmH20 Plateau Pressure:  [16 cmH20] 16 cmH20   Intake/Output Summary (Last 24 hours) at 09/23/2020 2031 Last data filed at 10/10/2020 2000 Gross per 24 hour  Intake 7458.15 ml  Output 3945 ml  Net 3513.15 ml   Filed Weights   10/17/2020 2230 09/30/20 0600 10/01/20 0500  Weight: 45.6 kg 48.2 kg 50.2 kg    Examination: General:   critically  ill elderly appearing female on mech vent HEENT: MM pink/moist; ETT in place Neuro: sedate CV: s1s2, RRR, no m/r/g PULM:  dim clear BS bilaterally; on mech vent PRVC 50% 5 peep sats 99% GI: soft, linear abdomonial incision without erythema or drainage Extremities: warm/dry, no edema  Skin: no rashes or lesions    Resolved Hospital Problem list     Assessment & Plan:  aortobifemoral bypass graft infection REMOVAL OF AORTA  BI-FEMORAL BYPASS GRAFT RIGTH AXILLARY BI-FEMORAL BYPASS GRAFT and LEFT AXILLARY POPLITEAL BYPASS GRAFT.  P: -Vascular Surgery primary -500 cc blood loss; repleted in OR -Hgb stable; transfuse for hgb <7  -continue to hold eliquis -continue neo for MAP >65 -ID following for graft infection -continue to follow BC -continue cetriaxone and vanc  small serosal injury to duodenum s/p procedure above: Patched with omentum and left NG tube with drain.  -General Surgery following -continue NG tube to intermittent suction; NPO  Right ureteroureterostomy 8/12 with ureteral stent placement/exchange for complete mid ureteral transection.  P: -Urology following -continue foley at least 10 days -keep ureteral stent at least 6 weeks -monitor UOP/CMP  Acute respiratory failure with hypoxia s/p surgical procedure requiring mechanical ventilation P: -continue mech vent overnight PRVC 6-8 cc/kg -wean fio2 for sats >92% -vap prevention in place -Daily SBT/SAT -Sedation propofol/fent for RASS -1 to -2  Hyperglycemia: no hx of diabetes P: -sensitive SSI and CBG monitoring -A1C pending  Hypomagnesemia P: -repleted; goal mag >2 -trend mag/cmp  Thrombocytopenia Anemia P: -trend CBC -transfuse for hgb >7  Hypothermia P: -continue bare hugger  Hx of COPD P: -continue pulmicort/brovana while intubated; hold home symbicort while intubated -prn albuterol for wheezing  Hx of carotid artery occlusion, CVA, hyper otitis, HTN, HLD, hypothyroidism, IBS, MI, PVD, PAF P: -continue synthroid -hold home beta blocker while hypotensive -hold home eliquis     Best Practice (right click and "Reselect all SmartList Selections" daily)   Diet/type: NPO DVT prophylaxis: SCD GI prophylaxis: PPI Lines: Central line and Arterial Line Foley:  Yes, and it is still needed Code Status:  full code Last date of multidisciplinary goals of care discussion [pending]  Labs   CBC: Recent Labs  Lab  10/16/2020 1226 10/20/2020 1844 09/30/20 0507 10/13/2020 0318 10/20/2020 0826 10/21/2020 1319 09/23/2020 1511 09/22/2020 1750 09/26/2020 1908 09/28/2020 1938  WBC 8.5  --  8.6 11.5*  --   --   --   --  7.1  --   NEUTROABS 5.6  --   --   --   --   --   --   --   --   --   HGB 10.6*   < > 9.3* 8.4*   < > 10.5* 9.9* 6.5* 9.4* 8.8*  HCT 34.6*   < > 30.4* 27.7*   < > 31.0* 29.0* 19.0* 29.9* 26.0*  MCV 86.9  --  86.9 89.9  --   --   --   --  90.6  --   PLT 243  --  216 159  --   --   --   --  95*  --    < > = values in this interval not displayed.    Basic Metabolic Panel: Recent Labs  Lab 10/20/2020 1226 10/04/2020 1844 09/30/20 0507 09/30/20 0758 10/17/2020 0318 09/24/2020 0826 10/18/2020 1314 09/21/2020 1319 10/01/2020 1511 09/24/2020 1750 10/18/2020 1908 10/15/2020 1938  NA 135   < > 135 136 136   < > 135 136 136 139 134*  139  K 4.4   < > 4.6 4.4 4.5   < > 6.4* 4.7 4.9 4.8 4.4 4.5  CL 97*  --  102 102 108  --  105 104  --   --  106  --   CO2 29  --  '26 27 25  '$ --   --   --   --   --  19*  --   GLUCOSE 89  --  118* 103* 99  --  161* 160*  --   --  203*  --   BUN 20  --  '20 21 14  '$ --  16 15  --   --  14  --   CREATININE 0.92  --  0.95 0.88 0.91  --  0.80 0.60  --   --  1.02*  --   CALCIUM 9.3  --  8.3* 8.3* 8.0*  --   --   --   --   --  7.3*  --   MG  --   --   --   --   --   --   --   --   --   --  1.4*  --    < > = values in this interval not displayed.   GFR: Estimated Creatinine Clearance: 32 mL/min (A) (by C-G formula based on SCr of 1.02 mg/dL (H)). Recent Labs  Lab 10/03/2020 1226 09/30/20 0507 09/24/2020 0318 09/28/2020 1908  WBC 8.5 8.6 11.5* 7.1    Liver Function Tests: Recent Labs  Lab 09/30/20 0758 09/27/2020 0318  AST 22 17  ALT 15 13  ALKPHOS 50 48  BILITOT 0.5 0.4  PROT 5.7* 4.9*  ALBUMIN 3.0* 2.4*   No results for input(s): LIPASE, AMYLASE in the last 168 hours. No results for input(s): AMMONIA in the last 168 hours.  ABG    Component Value Date/Time   PHART 7.395 10/15/2020  1938   PCO2ART 34.1 09/23/2020 1938   PO2ART 385 (H) 10/11/2020 1938   HCO3 21.9 10/14/2020 1938   TCO2 23 10/04/2020 1938   ACIDBASEDEF 4.0 (H) 10/17/2020 1938   O2SAT 100.0 09/24/2020 1938     Coagulation Profile: Recent Labs  Lab 10/17/2020 1908  INR 1.5*    Cardiac Enzymes: No results for input(s): CKTOTAL, CKMB, CKMBINDEX, TROPONINI in the last 168 hours.  HbA1C: No results found for: HGBA1C  CBG: Recent Labs  Lab 10/05/2020 1934  GLUCAP 193*    Review of Systems:   Unable to obtain. Patient intubated/sedated. Obtained information from chart and bedside nurse  Past Medical History:  She,  has a past medical history of Anxiety, Carotid artery occlusion (05/08/2007), Cerebrovascular occlusion (2001), Depression, Hepatitis, Hyperlipidemia, IBS (irritable bowel syndrome), Myocardial infarction [facility]), Peripheral vascular disease ([place]), Rheumatic fever, Stroke [facility]), and Thyroid disease.   Surgical History:   Past Surgical History:  Procedure Laterality Date   CAROTID ENDARTERECTOMY  11/25/1999   S/P Left   CAROTID ENDARTERECTOMY  03/24/2004   Left CCA stent- Dr. Amedeo Plenty   CAROTID STENT  2006   CHOLECYSTECTOMY     COLONOSCOPY N/A 05/08/2013   Procedure: COLONOSCOPY;  Surgeon: Rogene Houston, MD;  Location: [facility];  Service: Endoscopy;  Laterality: N/A;  230-moved to 9 Ann notified pt   EYE SURGERY Bilateral    cataract removed lens implanted   INCISION AND DRAINAGE OF WOUND Left 10/21/2020   Procedure: IRRIGATION AND DEBRIDEMENT LEFT GROIN;;  Surgeon: Elam Dutch, MD;  Location: [facility] OR;  Service: Vascular;  Laterality: Left;   PATCH ANGIOPLASTY Left 09/22/2020   Procedure: PATCH ANGIOPLASTY LEFT COMMON FEMORAL ARTERY USING XENOSURE BIOLOGIC PATCH;  Surgeon: Elam Dutch, MD;  Location: [place];  Service: Vascular;  Laterality: Left;   PR VEIN BYPASS GRAFT,AORTO-FEM-POP  2001   WOUND DEBRIDEMENT Left 01/22/2020   Procedure: DEBRIDEMENT LEFT GROIN WOUND;   Surgeon: Rosetta Posner, MD;  Location: [place];  Service: Vascular;  Laterality: Left;     Social History:   reports that she has been smoking cigarettes. She has a 20.79 pack-year smoking history. She has never used smokeless tobacco. She reports that she does not drink alcohol and does not use drugs.   Family History:  Her family history includes Cancer in her brother, brother, and sister; Heart attack in her mother; Hyperlipidemia in her mother; Hypertension in her father; Kidney disease in her brother. There is no history of Colon cancer.   Allergies Allergies  Allergen Reactions   Penicillins Swelling   Citalopram Other (See Comments)    Nightmares 10/07/2020 Pt states that she is currently taking this drug.   Morphine And Related Nausea And Vomiting   Losartan Nausea Only and Other (See Comments)    Nausea and dizzy. 09/23/2020 Pt states she does not recall having an intolerance to this drug.     Home Medications  Prior to Admission medications   Medication Sig Start Date End Date Taking? Authorizing Provider  albuterol (VENTOLIN HFA) 108 (90 Base) MCG/ACT inhaler Inhale 2 puffs into the lungs every 6 (six) hours as needed for wheezing or shortness of breath.   Yes [provider]  apixaban (ELIQUIS) 5 MG TABS tablet Take 1 tablet (5 mg total) by mouth 2 (two) times daily. 01/28/16  Yes Nahser, Wonda Cheng, MD  budesonide-formoterol [facility]) 160-4.5 MCG/ACT inhaler Inhale 2 puffs into the lungs daily as needed for wheezing. 06/22/20  Yes [provider]  calcium carbonate (OS-CAL - DOSED IN MG OF ELEMENTAL CALCIUM) 1250 (500 Ca) MG tablet Take 1 tablet by mouth daily.   Yes [provider]  carvedilol (COREG) 6.25 MG tablet Take 1 tablet (6.25 mg total) by mouth 2 (two) times daily. 02/04/16  Yes Nahser, Wonda Cheng, MD  citalopram (CELEXA) 20 MG tablet Take 20 mg by mouth daily. 12/06/19  Yes [provider]  cyanocobalamin 1000 MCG tablet Take 1,000 mcg by  mouth daily.  02/26/19  Yes [provider]  dicyclomine (BENTYL) 20 MG tablet Take 20 mg by mouth in the morning, at noon, and at bedtime.    Yes [provider]  HYDROcodone-acetaminophen (NORCO/VICODIN) 5-325 MG tablet Take 1 tablet by mouth 4 (four) times daily as needed. 01/22/20  Yes [provider]  levothyroxine (SYNTHROID) 112 MCG tablet Take 112 mcg by mouth daily before breakfast.    Yes [provider]  mirabegron ER (MYRBETRIQ) 25 MG TB24 tablet Take 25 mg by mouth daily.   Yes [provider]  Multiple Vitamin (MULTIVITAMIN) capsule Take 1 capsule by mouth daily.   Yes [provider]  naloxone (NARCAN) nasal spray 4 mg/0.1 mL Place 1 spray into the nose daily as needed (narcotic overdose). 10/31/18  Yes [provider]  Omadacycline Tosylate 150 MG TABS Take 3 tablets (450 mg) by mouth once daily on days 1 and 2 and then 2 tablets (300 mg) by mouth once daily thereafter. 09/25/20  Yes Kuppelweiser, Cassie L, RPH-CPP  rosuvastatin (CRESTOR) 20 MG  tablet Take 20 mg by mouth daily.   Yes [provider]  tiZANidine (ZANAFLEX) 4 MG tablet Take 1 tablet by mouth as needed. 12/24/18  Yes [provider]  doxycycline (VIBRAMYCIN) 100 MG capsule Take 1 capsule (100 mg total) by mouth 2 (two) times daily. Patient not taking: No sig reported 08/20/20   Elam Dutch, MD     Critical care time: 45 minutes    JD Rexene Agent  Pulmonary & Critical Care 09/27/2020, 8:32 PM  Please see [email or web address] for pager details.  From 7A-7P if no response, please call [phone]. After hours, please call ELink [phone].

## 2020-10-02 NOTE — Progress Notes (Signed)
Patient ID: [name], female   DOB: [birth year], 75 y.o.   MRN: [id number] Evaluated patient in OR with Dr Oneida Alar.  Small serosal injury to duodenum while removing infected aortic graft.  There is dacron and a lot of scar tissue around this and I dont think any more lembert sutures as already repaired by Dr Oneida Alar needed.  Discussed patching this with omentum and leaving ng tube and drain. We can follow with vascular surgery

## 2020-10-02 NOTE — Interval H&P Note (Signed)
History and Physical Interval Note:  09/23/2020 7:18 AM  [name]  has presented today for surgery, with the diagnosis of infected left aorta bifem.  The various methods of treatment have been discussed with the patient and family. After consideration of risks, benefits and other options for treatment, the patient has consented to  Procedure(s): REMOVAL OF AORTA BI-FEMORAL BYPASS GRAFT (N/A) RIGTH AXILLARY BI-FEMORAL BYPASS GRAFT (Right) LEFT AXILLARY POPLITEAL BYPASS GRAFT (Left) as a surgical intervention.  The patient's history has been reviewed, patient examined, no change in status, stable for surgery.  I have reviewed the patient's chart and labs.  Questions were answered to the patient's satisfaction.     Ruta Hinds

## 2020-10-02 NOTE — Transfer of Care (Signed)
Immediate Anesthesia Transfer of Care Note  Patient: [name]  Procedure(s) Performed: REMOVAL OF INFECTED AORTA BI-FEMORAL BYPASS GRAFT (Abdomen) RIGTH AXILLARY TO  RIGHT FEMORAL BYPASS GRAFT (Right: Chest) LEFT AXILLARY TO LEFT FEMORAL BYPASS GRAFT (Left: Chest) SMALL BOWEL REPAIR (Abdomen) PATCH ANGIOPLASTY EXPLORATORY LAPAROTOMY (Abdomen) INSERTION OF 31F x 26cm CONTOUR STENT IN RIGHT URETER AND RIGHT URETEROURETEROSTOMY (Right: Ureter)  Patient Location: ICU  Anesthesia Type:General  Level of Consciousness: Patient remains intubated per anesthesia plan  Airway & Oxygen Therapy: Patient remains intubated per anesthesia plan and Patient placed on Ventilator (see vital sign flow sheet for setting)  Post-op Assessment: Report given to RN and Post -op Vital signs reviewed and stable  Post vital signs: Reviewed and stable  Last Vitals:  Vitals Value Taken Time  BP 185/84 10/03/2020 1919  Temp 32.4 C 09/23/2020 1921  Pulse 77 10/13/2020 1918  Resp 19 09/27/2020 1921  SpO2 91 % 09/27/2020 1918  Vitals shown include unvalidated device data.  Last Pain:  Vitals:   09/28/2020 0511  TempSrc:   PainSc: 3       Patients Stated Pain Goal: 2 ([id number])  Complications: No notable events documented.

## 2020-10-02 NOTE — Progress Notes (Addendum)
ID Brief Note   Patient not seen today. Was in OR whole day for removal of aorto-bifemoral bypass graft and placement of Rt axillary Bifemoral bypass graft and left axillary popliteal bypass graft.  Remains afebrile, WBC 11.5 Blood cultures 8/11 no growth in < 24 hrs  10/21/2020 left groin wound gram stain and cultures negative so far Continue vancomycin and ceftriaxone for now pending OR procedure/OR findings and cultures for final recommendations  Dr Juleen China is on call this weekend with questions. Otherwise, new ID team will follow on Monday   Rosiland Oz, MD Infectious Disease Physician [facility] [address] [place], [place] [address] Phone: [phone]  Fax: [phone]

## 2020-10-02 NOTE — Anesthesia Procedure Notes (Signed)
Procedure Name: Intubation Date/Time: 10/12/2020 7:55 AM Performed by: Harden Mo, CRNA Pre-anesthesia Checklist: Patient identified, Emergency Drugs available, Suction available and Patient being monitored Patient Re-evaluated:Patient Re-evaluated prior to induction Oxygen Delivery Method: Circle System Utilized Preoxygenation: Pre-oxygenation with 100% oxygen Induction Type: IV induction Ventilation: Mask ventilation without difficulty and Oral airway inserted - appropriate to patient size Laryngoscope Size: Sabra Heck and 2 Grade View: Grade I Tube type: Oral Tube size: 7.5 mm Number of attempts: 1 Airway Equipment and Method: Stylet and Oral airway Placement Confirmation: ETT inserted through vocal cords under direct vision, positive ETCO2 and breath sounds checked- equal and bilateral Secured at: 21 cm Tube secured with: Tape Dental Injury: Teeth and Oropharynx as per pre-operative assessment

## 2020-10-02 NOTE — Interval H&P Note (Signed)
History and Physical Interval Note:  10/20/2020 7:19 AM  [name]  has presented today for surgery, with the diagnosis of infected left aorta bifem.  The various methods of treatment have been discussed with the patient and family. After consideration of risks, benefits and other options for treatment, the patient has consented to  Procedure(s): REMOVAL OF AORTA BI-FEMORAL BYPASS GRAFT (N/A) RIGTH AXILLARY BI-FEMORAL BYPASS GRAFT (Right) LEFT AXILLARY POPLITEAL BYPASS GRAFT (Left) as a surgical intervention.  The patient's history has been reviewed, patient examined, no change in status, stable for surgery.  I have reviewed the patient's chart and labs.  Questions were answered to the patient's satisfaction.     Ruta Hinds

## 2020-10-02 NOTE — Anesthesia Postprocedure Evaluation (Signed)
Anesthesia Post Note  Patient: [name]  Procedure(s) Performed: REMOVAL OF INFECTED AORTA BI-FEMORAL BYPASS GRAFT (Abdomen) RIGTH AXILLARY TO  RIGHT FEMORAL BYPASS GRAFT (Right: Chest) LEFT AXILLARY TO LEFT FEMORAL BYPASS GRAFT (Left: Chest) SMALL BOWEL REPAIR (Abdomen) PATCH ANGIOPLASTY EXPLORATORY LAPAROTOMY (Abdomen) INSERTION OF 76F x 26cm CONTOUR STENT IN RIGHT URETER AND RIGHT URETEROURETEROSTOMY (Right: Ureter)     Patient location during evaluation: SICU Anesthesia Type: General Level of consciousness: sedated Pain management: pain level controlled Vital Signs Assessment: post-procedure vital signs reviewed and stable Respiratory status: patient remains intubated per anesthesia plan Cardiovascular status: stable Postop Assessment: no apparent nausea or vomiting Anesthetic complications: no   No notable events documented.  Last Vitals:  Vitals:   10/19/2020 1900 09/27/2020 2000  BP:  133/77  Pulse:  69  Resp:  18  Temp:  (!) 32.8 C  SpO2: 100% 100%    Last Pain:  Vitals:   10/03/2020 2000  TempSrc: Esophageal  PainSc:                  Jaxyn Mestas S

## 2020-10-02 NOTE — Op Note (Signed)
Procedure: 1.  Right axillary femoral bypass (8 mm dacron)  2.  Left axillary to superficial femoral artery bypass (8 mm ring supported PTFE)  3.  Removal of aortobifemoral bypass with oversewing of aortic stump just below the renal arteries  4.  Vein patch angioplasty left common femoral artery  Preoperative diagnosis: Infected aortobifemoral bypass graft  Postoperative diagnosis: Same  Anesthesia: General  Assistant: Fortunato Curling, MD  Assistant: Risa Grill, PA-C  Operative findings: 1.  Intraoperative cultures from right groin and aortic graft  2.  Small enterotomy repaired with two 3-0 silk Lembert sutures adjacent to aorta  3.  Right ureteral transection repaired by urology service and stented  4.  3 layer closure of aortic stump  5.  Suprarenal clamp time 33 minutes  6.  Estimated blood loss 500 cc  Operative details: After obtaining informed consent, the patient taken the operating.  The patient was placed in supine position operating table.  After induction general anesthesia and endotracheal intubation nasogastric and Foley catheter placed.  Next the patient was prepped and draped in usual sterile fashion from the chin to the toes.  A longitudinal incision was made in the right groin carried down through subcutaneous tissues down the level of the pre-existing limb of an aortobifemoral bypass graft.  This was dissected free circumferentially.  The profunda femoris and superficial femoral arteries were also dissected free circumferentially and Vesseloops placed around this.  The graft appeared to be well incorporated.  A stat intraoperative gram stain was obtained which showed some white blood cells but no bacteria.  I felt it was safe to go ahead and place the right axillary femoral bypass to the right groin.  The right limb of the bypass graft that was pre-existing was dissected free circumferentially.  Next a oblique incision was made in the right infraclavicular position to  expose the right axillary artery.  Incision was carried down through the muscle fibers and these were split along their direction and the plexus was identified and protected.  Axillary vein was reflected inferiorly and axillary artery was dissected free circumferentially and elevated up in the operative field.  The right internal mammary artery was ligated divided tween silk ties to provide assistance with exposure.  Next a similar incision was made in the left infraclavicular region and similar exposure of the axillary artery was performed with ligation of the left internal mammary artery.  This was also divided.  A longitudinal incision was then made just anterior to the sartorius muscle about 5 cm below a pre-existing left groin incision.  The sartorius muscle was reflected laterally and the superficial femoral artery was dissected free circumferentially.  It was good of good quality and soft on palpation.  Of note the axillary arteries were also of good quality and soft on palpation.  Next an 8 mm tunneler was used to tunnel an 8 mm dacryon graft from the groin to the axillary artery.  We did not have a dacryon graft that was long enough to extend to the superficial femoral artery so on the left side a 8 mm ring supported PTFE graft was brought through the tunnel.  Both of these tunnels were created subfascial and just medial to the anterior superior iliac spine and down to the groin on the right side and down to the superficial femoral artery which was in the proximal thigh on the left side.  The patient was then given 6000 units of intravenous heparin.  She was given an additional  dose of 3000 and an additional dose of 6000 units of heparin during the course of the case.  The right axillary artery was controlled proximally distally with Henley clamps.  Longitudinal opening was made in the artery and a punch used to widen the arteriotomy.  The dacryon was then beveled and sewn end of dacryon to the side of  artery using a running 6-0 Prolene suture.  Just prior to completion anastomosis it was for blood backbled and thoroughly flushed reanastomosed was secured clamps released there was 1 repair stitch placed on the posterior wall.  The right common femoral artery was then prepared for anastomosis by dividing the old aortobifemoral bypass graft and pushing this up into the abdomen.  There was no flow coming down the right native common femoral artery and this was completely occluded.  Therefore this was transected and the axillary limb sewn end of dacryon graft to end of common femoral artery using a running 6-0 Prolene suture.  Just prior to completion anastomosis this was for blood backbled and thoroughly flushed anastomosis was secured clamps released there was good pulsatile flow in the profunda and superficial femoral artery immediately.  There was flow into the dorsalis pedis and posterior tibial areas of the foot on the right side.  On the left side the left axillary artery was controlled proximally distally with Henley clamps.  A similar arteriotomy was made.  The PTFE was then beveled and sewn end of graft to side of axillary artery using a running 6-0 Prolene suture.  Despite completion anastomosis it was for blood backbled and thoroughly flushed.  Anastomosis was secured clamps released and the clamp was moved down to the distal limb of the PTFE material in the thigh.  The superficial femoral artery was then controlled proximally and distally with Vesseloops.  The graft was cut to length and beveled and sewn end of graft to side of artery using a running 6-0 Prolene suture.  This prior to completion of anastomosis it was for blood backbled and thoroughly flushed reanastomosed was secured clamps released there is pulsatile flow in the SFA immediately.  There was retrograde flow back up into the common femoral artery on the left side.  There was a dorsalis pedis and posterior tibial Doppler signal on the left  foot.  At this point hemostasis was obtained in all of these incisions.  The axillary incisions were then closed with running 302 0 and 3-0 Vicryl suture followed by staples in the skin.  The right groin incision and the left superficial femoral artery incision were closed in similar fashion.  At this point the old left groin incision was reopened and this area prepared to remove the graft.  The superficial femoral and profunda femoris arteries were controlled with Vesseloops.  The common femoral artery and the left limb of the aortobifemoral bypass graft were controlled with loops as well.  It was then turned to the abdomen.  A midline laparotomy incision was made extending from the xiphoid to the pubis.  Incision was carried down through the subcutaneous tissues down the level of fascia.  Pre-existing Prolene suture was removed.  There was some adhesions up to the abdominal wall and these were all taken down with cautery.  The small bowel was then reflected to the right and the transverse colon superiorly.  An Omni retractor was used for assistance in retracting.  The retroperitoneal space was entered and I traced the graft all the way up to the level of the  proximal anastomosis.  It had areas of poor incorporation with some areas that were quite stuck.  The duodenum was quite adherent to this.  1 small hole was made in the duodenum and this was repaired with two 3-0 silk Lembert sutures which achieved repair of the duodenum and there was no further leakage of material.  At this point the left renal vein was identified and it was dissected free circumferentially and ligated after dividing using a running 5-0 Prolene suture on either side.  The left renal artery was identified and protected.  I then prepared a suitable clamping spot just below the left renal artery.  The patient had already been heparinized and I used a aortic DeBakey clamp to clamp the aorta but there was still pulsatile flow in the aortobifemoral  bypass graft.  It seemed that there was calcium along the posterior wall preventing occlusion of the aorta.  Therefore I prepared a suprarenal clamp clamping site.  The left and right renal arteries were both dissected free circumferentially and Vesseloops placed around these.  I then was able to get around the suprarenal aorta which was softer in character and I dissected this free about 80% around the aorta.  With a clamped just above the renal arteries and the renal arteries controlled with Vesseloops I had to place an additional aortic clamp just below the renal arteries to get proximal control.  Distal control was obtained with a aortic clamp on the distal portion of the aortobifemoral bypass graft.  The Vicryl at past graft was then entered and all the dacryon material was debrided away all the way up to the level of proximal anastomosis.  There was 1 area of Dacron which appeared to be an old cough or pledget.  This was densely stuck to the duodenum.  I left about a 1 cm x 1 cm piece of dacryon behind it so as not to injure the duodenum further.  The aortic stump was then oversewn with a running and mattressing 3-0 Prolene suture.  An additional layer of running 3-0 Prolene suture was placed about 1 cm above this through the aorta.  Clamps were then removed intervening was hemostatic.  I then proceeded to remove the rest of the graft.  Most of this peeled out of the right iliac bed fairly easily the iliac vein was protected.  The right limb after some gentle traction and dissection came free and I was able to pull this back up into the abdomen.  Similar dissection was performed to free up the left limb of the graft.  Attention was then turned to the left groin.  The superficial femoral and profunda femoris arteries were controlled with Vesseloops.  I then opened the hood of the pre-existing left femoral anastomosis.  There was some flow coming down out of the pelvis from the native common femoral artery and  this was controlled with a 4 Fogarty catheter.  The remainder of the left limb of the graft was disconnected and I was able to pull this out through the abdomen.  Several pieces of this were sent for culture.  Since there was still some native flow in the left common femoral artery a piece of saphenous vein was then harvested from just medial to this.  About 3 cm was harvested up to the saphenofemoral junction and down to the proximal saphenous vein.  This was ligated with a 2-0 silk tie proximally distally and then the vein opened longitudinally and sewn on as a  patch angioplasty to the common femoral artery on the left side using a running 6-0 Prolene suture.  Just prior to completion anastomosis it was for blood backbled and thoroughly flushed.  The Vesseloops were released from the superficial femoral artery and there was good pulsatile retrograde flow from the pre-existing access superficial femoral bypass back up into the pelvis.  After hemostasis was obtained in this groin incision this was then closed in multiple layers using running 2-0 and 3-0 Vicryl suture and staples in the skin.  Attention was then once again turned to the abdomen.  While inspecting the abdomen for hemostasis I noticed on the right side that a previously placed right ureteral stent was obviously visible and that the right ureter had been transected at some portion of the case.  At this point urology was called in to repair this and this is dictated as a separate operative note.  After the ureter had been repaired I also called Dr. Donne Hazel from general surgery to inspect the repair of the duodenum.  He felt that no further sutures were necessary and suggested we place a drain in the retroperitoneal space.  At this point 2 Jackson-Pratt drains were brought up in the operative field.  One was placed into the deep pelvis to see if there was any urine leak from the ureteral repair.  An additional drain was placed adjacent to the duodenum  near the area of repair.  I then took a long tongue of omentum and mobilize this and sewed this down adjacent to the aortic stump and the area of repair of the duodenum.  The viscera were then returned to their position.  The fascia was then reapproximated using a running #1 PDS suture.  The skin was closed with staples.  Patient tolerated procedure well and there were no complications other than the aforementioned small injury to the duodenum and the right ureter.  Patient was taken to the ICU in stable condition on a ventilator.  Instrument sponge and needle counts were correct at the end of the case.  Ruta Hinds, MD [facility] Office: [phone]

## 2020-10-02 NOTE — Procedures (Addendum)
Central Venous Catheter Insertion Procedure Note  DUSTIN KRAS  JL:[id number]  [birth year]  Date:09/24/2020  Time:9:41 PM   Provider Performing:Indiana Pechacek D Rollene Rotunda   Procedure: Insertion of Non-tunneled Central Venous Catheter(36556) without US guidance over introducer sheath central line  Indication(s) Medication administration and CVP measurement  Consent Unable to obtain consent due to emergent nature of procedure.  Anesthesia none  Timeout Verified patient identification, verified procedure, site/side was marked, verified correct patient position, special equipment/implants available, medications/allergies/relevant history reviewed, required imaging and test results available.  Sterile Technique Maximal sterile technique including full sterile barrier drape, hand hygiene, sterile gown, sterile gloves, mask, hair covering, sterile ultrasound probe cover (if used).  Procedure Description Area of catheter insertion was cleaned with chlorhexidine and draped in sterile fashion.  Without real-time ultrasound guidance a central venous catheter was placed into the right internal jugular vein. Nonpulsatile blood flow and easy flushing noted in all ports.  The catheter was sutured in place and sterile dressing applied.  Complications/Tolerance None; patient tolerated the procedure well.  EBL Minimal  Specimen(s) None  JD Rexene Agent [facility] 10/09/2020, 9:43 PM  Please see [email or web address] for pager details.  From 7A-7P if no response, please call [phone]. After hours, please call ELink [phone].

## 2020-10-02 NOTE — Op Note (Signed)
Operative Note  Preoperative diagnosis:  1.  Right ureteral transection  Postoperative diagnosis: 1.  Right ureteral transection  Procedure(s): 1.  Right ureteroureterostomy with ureteral stent placement/exchange  Surgeon: Link Snuffer, MD  Co-surgeon: Ruta Hinds, MD  Resident: Bishop Limbo  Anesthesia: General  Complications: None immediate  EBL: 100 cc  Specimens: 1.  None  Drains/Catheters: 1.  6 x 26 double-J ureteral stent  Intraoperative findings: There was a complete mid ureteral transection.  She had a previously placed ureteral stent from chronic hydronephrosis.  There was significant scar tissue at the level of transection consistent with chronic process.  There was upstream hydroureter and the distal ureter was of normal caliber.  Scarred ureter was excised and healthy ureteral ends were reanastomosed.  Fluoroscopy at the conclusion of the case confirmed proximal and distal placement of the ureteral stent.  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 Kallie Locks.  She is currently admitted to the hospital after presenting with diffuse left groin bleeding.  She went to surgery on 8/9.  This was repaired and she was stabilized.  She presented today with Dr. Oneida Alar for removal of her aorta bifemoral bypass graft.  During the surgery it was noted that her ureter was transected and the ureteral stent is exposed.  Therefore, I was consulted.  Description of procedure:  Patient was in supine position with a Foley catheter in place.  The right ureter was fully transected in the midportion of the ureter.  There was significant scarring around this area from her prior graft.  I first extended the incision down to her pubic bone.  I developed the space of Retzius and  mobilized the bladder.  I then reinspected the ureteral transection.  I was able to dissect proximally and distally healthy ureter.  About 1 cm on each end was excised which was nonviable and scarred.  Healthy ends remained.  I was able to be reanastomosed without tension and therefore I decided to perform a ureteroureterostomy.  The previous stent was removed.  The proximal ureter was spatulated with Potts scissors as was the distal ureter.  4-0 Vicryl was then used to reanastomose the ureters.  This was performed in a running fashion.  Once about half the ureteral ends were reanastomosed, a wire was passed up the ureter and into the kidney.  A 6 x 26 double-J ureteral stent was advanced over the wire and into the kidney.  The wire was withdrawn.  The distal portion of the stent was advanced down the ureter towards the bladder.  We proceeded with the remainder of the closure.  X-ray was performed and it appeared the distal portion of the ureteral stent was in the distal ureter and not quite to the bladder.  A tiny incision was made on the anterior portion of the dilated proximal ureter and exposed the stent and carefully advanced the stent distally.  X-ray confirmed proximal and distal placement.  The ureterotomy was closed with a running 4-0 Vicryl.  We then inspected the left ureter.  It appeared intact.  No injury was identified.  This concluded our portion of the operation.  Dr. Oneida Alar plans to leave a drain at the end of the case.   Plan: Continue Foley catheter for at least 10 days.  She will need to keep the ureteral stent for at least 6  weeks at which point she will need to undergo bilateral ureteral stent exchange.

## 2020-10-03 ENCOUNTER — Inpatient Hospital Stay (HOSPITAL_COMMUNITY): Payer: Medicare HMO

## 2020-10-03 DIAGNOSIS — Z9911 Dependence on respirator [ventilator] status: Secondary | ICD-10-CM | POA: Diagnosis not present

## 2020-10-03 DIAGNOSIS — E875 Hyperkalemia: Secondary | ICD-10-CM | POA: Diagnosis not present

## 2020-10-03 DIAGNOSIS — R571 Hypovolemic shock: Secondary | ICD-10-CM | POA: Diagnosis not present

## 2020-10-03 DIAGNOSIS — J9601 Acute respiratory failure with hypoxia: Secondary | ICD-10-CM | POA: Diagnosis not present

## 2020-10-03 LAB — POCT I-STAT 7, (LYTES, BLD GAS, ICA,H+H)
Acid-base deficit: 9 mmol/L — ABNORMAL HIGH (ref 0.0–2.0)
Acid-base deficit: 9 mmol/L — ABNORMAL HIGH (ref 0.0–2.0)
Acid-base deficit: 3 mmol/L — ABNORMAL HIGH (ref 0.0–2.0)
Bicarbonate: 18.7 mmol/L — ABNORMAL LOW (ref 20.0–28.0)
Bicarbonate: 18.4 mmol/L — ABNORMAL LOW (ref 20.0–28.0)
Bicarbonate: 22.4 mmol/L (ref 20.0–28.0)
Calcium, Ion: 1.07 mmol/L — ABNORMAL LOW (ref 1.15–1.40)
Calcium, Ion: 1.03 mmol/L — ABNORMAL LOW (ref 1.15–1.40)
Calcium, Ion: 0.93 mmol/L — ABNORMAL LOW (ref 1.15–1.40)
HCT: 28 % — ABNORMAL LOW (ref 36.0–46.0)
HCT: 24 % — ABNORMAL LOW (ref 36.0–46.0)
HCT: 23 % — ABNORMAL LOW (ref 36.0–46.0)
Hemoglobin: 9.5 g/dL — ABNORMAL LOW (ref 12.0–15.0)
Hemoglobin: 8.2 g/dL — ABNORMAL LOW (ref 12.0–15.0)
Hemoglobin: 7.8 g/dL — ABNORMAL LOW (ref 12.0–15.0)
O2 Saturation: 99 %
O2 Saturation: 100 %
O2 Saturation: 96 %
Patient temperature: 35.7
Patient temperature: 36
Patient temperature: 98.6
Potassium: 5.5 mmol/L — ABNORMAL HIGH (ref 3.5–5.1)
Potassium: 5.5 mmol/L — ABNORMAL HIGH (ref 3.5–5.1)
Potassium: 5.1 mmol/L (ref 3.5–5.1)
Sodium: 139 mmol/L (ref 135–145)
Sodium: 140 mmol/L (ref 135–145)
Sodium: 142 mmol/L (ref 135–145)
TCO2: 20 mmol/L — ABNORMAL LOW (ref 22–32)
TCO2: 20 mmol/L — ABNORMAL LOW (ref 22–32)
TCO2: 24 mmol/L (ref 22–32)
pCO2 arterial: 45 mmHg (ref 32.0–48.0)
pCO2 arterial: 45.9 mmHg (ref 32.0–48.0)
pCO2 arterial: 38.9 mmHg (ref 32.0–48.0)
pH, Arterial: 7.219 — ABNORMAL LOW (ref 7.350–7.450)
pH, Arterial: 7.206 — ABNORMAL LOW (ref 7.350–7.450)
pH, Arterial: 7.369 (ref 7.350–7.450)
pO2, Arterial: 172 mmHg — ABNORMAL HIGH (ref 83.0–108.0)
pO2, Arterial: 271 mmHg — ABNORMAL HIGH (ref 83.0–108.0)
pO2, Arterial: 85 mmHg (ref 83.0–108.0)

## 2020-10-03 LAB — CBC
HCT: 32.8 % — ABNORMAL LOW (ref 36.0–46.0)
HCT: 28.2 % — ABNORMAL LOW (ref 36.0–46.0)
HCT: 28.6 % — ABNORMAL LOW (ref 36.0–46.0)
Hemoglobin: 10.7 g/dL — ABNORMAL LOW (ref 12.0–15.0)
Hemoglobin: 9 g/dL — ABNORMAL LOW (ref 12.0–15.0)
Hemoglobin: 9.4 g/dL — ABNORMAL LOW (ref 12.0–15.0)
MCH: 29.5 pg (ref 26.0–34.0)
MCH: 29.3 pg (ref 26.0–34.0)
MCH: 29.1 pg (ref 26.0–34.0)
MCHC: 32.6 g/dL (ref 30.0–36.0)
MCHC: 31.9 g/dL (ref 30.0–36.0)
MCHC: 32.9 g/dL (ref 30.0–36.0)
MCV: 90.4 fL (ref 80.0–100.0)
MCV: 91.9 fL (ref 80.0–100.0)
MCV: 88.5 fL (ref 80.0–100.0)
Platelets: 202 10*3/uL (ref 150–400)
Platelets: 175 10*3/uL (ref 150–400)
Platelets: 155 10*3/uL (ref 150–400)
RBC: 3.63 MIL/uL — ABNORMAL LOW (ref 3.87–5.11)
RBC: 3.07 MIL/uL — ABNORMAL LOW (ref 3.87–5.11)
RBC: 3.23 MIL/uL — ABNORMAL LOW (ref 3.87–5.11)
RDW: 15.2 % (ref 11.5–15.5)
RDW: 15.6 % — ABNORMAL HIGH (ref 11.5–15.5)
RDW: 15 % (ref 11.5–15.5)
WBC: 15.9 10*3/uL — ABNORMAL HIGH (ref 4.0–10.5)
WBC: 17.2 10*3/uL — ABNORMAL HIGH (ref 4.0–10.5)
WBC: 15.3 10*3/uL — ABNORMAL HIGH (ref 4.0–10.5)
nRBC: 0 % (ref 0.0–0.2)
nRBC: 0 % (ref 0.0–0.2)
nRBC: 0 % (ref 0.0–0.2)

## 2020-10-03 LAB — BPAM RBC
Blood Product Expiration Date: 202209132359
Blood Product Expiration Date: 202209132359
Blood Product Expiration Date: 202209142359
Blood Product Expiration Date: 202209142359
Blood Product Expiration Date: 202209142359
Blood Product Expiration Date: 202209142359
Blood Product Expiration Date: 202209142359
Blood Product Expiration Date: 202209142359
Blood Product Expiration Date: 202209142359
ISSUE DATE / TIME: 202208110005
ISSUE DATE / TIME: 202208110955
ISSUE DATE / TIME: 202208120709
ISSUE DATE / TIME: 202208120709
ISSUE DATE / TIME: 202208120709
ISSUE DATE / TIME: 202208120709
ISSUE DATE / TIME: 202208121619
ISSUE DATE / TIME: 202208130711
Unit Type and Rh: 5100
Unit Type and Rh: 5100
Unit Type and Rh: 5100
Unit Type and Rh: 5100
Unit Type and Rh: 5100
Unit Type and Rh: 5100
Unit Type and Rh: 5100
Unit Type and Rh: 5100
Unit Type and Rh: 5100

## 2020-10-03 LAB — AMYLASE: Amylase: 67 U/L (ref 28–100)

## 2020-10-03 LAB — HEMOGLOBIN A1C
Hgb A1c MFr Bld: 5.8 % — ABNORMAL HIGH (ref 4.8–5.6)
Mean Plasma Glucose: 119.76 mg/dL

## 2020-10-03 LAB — TYPE AND SCREEN
ABO/RH(D): O POS
Antibody Screen: NEGATIVE
Unit division: 0
Unit division: 0
Unit division: 0
Unit division: 0
Unit division: 0
Unit division: 0
Unit division: 0
Unit division: 0
Unit division: 0

## 2020-10-03 LAB — COMPREHENSIVE METABOLIC PANEL
ALT: 83 U/L — ABNORMAL HIGH (ref 0–44)
ALT: 553 U/L — ABNORMAL HIGH (ref 0–44)
AST: 104 U/L — ABNORMAL HIGH (ref 15–41)
AST: 533 U/L — ABNORMAL HIGH (ref 15–41)
Albumin: 2.7 g/dL — ABNORMAL LOW (ref 3.5–5.0)
Albumin: 2.3 g/dL — ABNORMAL LOW (ref 3.5–5.0)
Alkaline Phosphatase: 31 U/L — ABNORMAL LOW (ref 38–126)
Alkaline Phosphatase: 33 U/L — ABNORMAL LOW (ref 38–126)
Anion gap: 9 (ref 5–15)
Anion gap: 9 (ref 5–15)
BUN: 18 mg/dL (ref 8–23)
BUN: 18 mg/dL (ref 8–23)
CO2: 18 mmol/L — ABNORMAL LOW (ref 22–32)
CO2: 17 mmol/L — ABNORMAL LOW (ref 22–32)
Calcium: 7.4 mg/dL — ABNORMAL LOW (ref 8.9–10.3)
Calcium: 7 mg/dL — ABNORMAL LOW (ref 8.9–10.3)
Chloride: 109 mmol/L (ref 98–111)
Chloride: 110 mmol/L (ref 98–111)
Creatinine, Ser: 1.46 mg/dL — ABNORMAL HIGH (ref 0.44–1.00)
Creatinine, Ser: 1.72 mg/dL — ABNORMAL HIGH (ref 0.44–1.00)
GFR, Estimated: 37 mL/min — ABNORMAL LOW (ref >60)
GFR, Estimated: 30 mL/min — ABNORMAL LOW (ref >60)
Glucose, Bld: 167 mg/dL — ABNORMAL HIGH (ref 70–99)
Glucose, Bld: 165 mg/dL — ABNORMAL HIGH (ref 70–99)
Potassium: 5.2 mmol/L — ABNORMAL HIGH (ref 3.5–5.1)
Potassium: 5.6 mmol/L — ABNORMAL HIGH (ref 3.5–5.1)
Sodium: 136 mmol/L (ref 135–145)
Sodium: 136 mmol/L (ref 135–145)
Total Bilirubin: 0.8 mg/dL (ref 0.3–1.2)
Total Bilirubin: 0.7 mg/dL (ref 0.3–1.2)
Total Protein: 4.5 g/dL — ABNORMAL LOW (ref 6.5–8.1)
Total Protein: 3.8 g/dL — ABNORMAL LOW (ref 6.5–8.1)

## 2020-10-03 LAB — BPAM FFP
Blood Product Expiration Date: 202208142359
Blood Product Expiration Date: 202208142359
ISSUE DATE / TIME: 202208121309
ISSUE DATE / TIME: 202208121309
Unit Type and Rh: 6200
Unit Type and Rh: 6200

## 2020-10-03 LAB — GLUCOSE, CAPILLARY
Glucose-Capillary: 123 mg/dL — ABNORMAL HIGH (ref 70–99)
Glucose-Capillary: 152 mg/dL — ABNORMAL HIGH (ref 70–99)
Glucose-Capillary: 153 mg/dL — ABNORMAL HIGH (ref 70–99)
Glucose-Capillary: 156 mg/dL — ABNORMAL HIGH (ref 70–99)
Glucose-Capillary: 162 mg/dL — ABNORMAL HIGH (ref 70–99)
Glucose-Capillary: 166 mg/dL — ABNORMAL HIGH (ref 70–99)
Glucose-Capillary: 184 mg/dL — ABNORMAL HIGH (ref 70–99)
Glucose-Capillary: 95 mg/dL (ref 70–99)

## 2020-10-03 LAB — BASIC METABOLIC PANEL
Anion gap: 9 (ref 5–15)
BUN: 20 mg/dL (ref 8–23)
CO2: 21 mmol/L — ABNORMAL LOW (ref 22–32)
Calcium: 6.8 mg/dL — ABNORMAL LOW (ref 8.9–10.3)
Chloride: 109 mmol/L (ref 98–111)
Creatinine, Ser: 1.88 mg/dL — ABNORMAL HIGH (ref 0.44–1.00)
GFR, Estimated: 27 mL/min — ABNORMAL LOW (ref >60)
Glucose, Bld: 162 mg/dL — ABNORMAL HIGH (ref 70–99)
Potassium: 4.5 mmol/L (ref 3.5–5.1)
Sodium: 139 mmol/L (ref 135–145)

## 2020-10-03 LAB — PROTIME-INR
INR: 2.1 — ABNORMAL HIGH (ref 0.8–1.2)
Prothrombin Time: 23.9 seconds — ABNORMAL HIGH (ref 11.4–15.2)

## 2020-10-03 LAB — PREPARE FRESH FROZEN PLASMA: Unit division: 0

## 2020-10-03 LAB — PREPARE RBC (CROSSMATCH)

## 2020-10-03 LAB — LACTIC ACID, PLASMA
Lactic Acid, Venous: 4.9 mmol/L (ref 0.5–1.9)
Lactic Acid, Venous: 4.1 mmol/L (ref 0.5–1.9)

## 2020-10-03 LAB — TROPONIN I (HIGH SENSITIVITY)
Troponin I (High Sensitivity): 17 ng/L (ref <18)
Troponin I (High Sensitivity): 20 ng/L — ABNORMAL HIGH (ref <18)

## 2020-10-03 LAB — CREATININE, FLUID (PLEURAL, PERITONEAL, JP DRAINAGE): Creat, Fluid: 1.8 mg/dL

## 2020-10-03 LAB — PHOSPHORUS: Phosphorus: 4.7 mg/dL — ABNORMAL HIGH (ref 2.5–4.6)

## 2020-10-03 LAB — FIBRINOGEN: Fibrinogen: 167 mg/dL — ABNORMAL LOW (ref 210–475)

## 2020-10-03 LAB — MAGNESIUM
Magnesium: 2.9 mg/dL — ABNORMAL HIGH (ref 1.7–2.4)
Magnesium: 2.5 mg/dL — ABNORMAL HIGH (ref 1.7–2.4)

## 2020-10-03 LAB — APTT: aPTT: 42 seconds — ABNORMAL HIGH (ref 24–36)

## 2020-10-03 LAB — TRIGLYCERIDES: Triglycerides: 94 mg/dL (ref <150)

## 2020-10-03 MED ORDER — LACTATED RINGERS IV BOLUS
1000.0000 mL | Freq: Once | INTRAVENOUS | Status: AC
  Administered 2020-10-03: 1000 mL via INTRAVENOUS

## 2020-10-03 MED ORDER — SODIUM CHLORIDE 0.9% IV SOLUTION: Freq: Once | INTRAVENOUS | Status: AC

## 2020-10-03 MED ORDER — SODIUM BICARBONATE 8.4 % IV SOLN
INTRAVENOUS | Status: AC
  Administered 2020-10-03: 1 meq
  Filled 2020-10-03: qty 100

## 2020-10-03 MED ORDER — SODIUM CHLORIDE 0.9 % IV SOLN
2.0000 g | INTRAVENOUS | Status: DC
  Administered 2020-10-03 – 2020-10-05 (×3): 2 g via INTRAVENOUS
  Filled 2020-10-03 (×4): qty 2

## 2020-10-03 MED ORDER — ALBUTEROL SULFATE (2.5 MG/3ML) 0.083% IN NEBU
2.5000 mg | INHALATION_SOLUTION | RESPIRATORY_TRACT | Status: DC | PRN
  Administered 2020-10-03: 2.5 mg via RESPIRATORY_TRACT
  Filled 2020-10-03: qty 3

## 2020-10-03 MED ORDER — SODIUM BICARBONATE 8.4 % IV SOLN
INTRAVENOUS | Status: DC
  Filled 2020-10-03 (×4): qty 1000

## 2020-10-03 MED ORDER — SODIUM BICARBONATE 8.4 % IV SOLN: 100.0000 meq | Freq: Once | INTRAVENOUS | Status: AC

## 2020-10-03 MED ORDER — SODIUM CHLORIDE 0.9 % IV BOLUS
1000.0000 mL | Freq: Once | INTRAVENOUS | Status: AC
  Administered 2020-10-03: 1000 mL via INTRAVENOUS

## 2020-10-03 NOTE — Progress Notes (Signed)
1 Day Post-Op   Subjective/Chief Complaint: On vent sedated, minimal uop   Objective: Vital signs in last 24 hours: Temp:  [91 F (32.8 C)-97.7 F (36.5 C)] 97.7 F (36.5 C) (08/13 0824) Pulse Rate:  [62-104] 62 (08/13 0824) Resp:  [16-28] 28 (08/13 0824) BP: (88-201)/(49-81) 148/66 (08/13 0800) SpO2:  [85 %-100 %] 100 % (08/13 0824) Arterial Line BP: (64-188)/(41-73) 145/61 (08/13 0745) FiO2 (%):  [50 %-100 %] 80 % (08/13 0745)    Intake/Output from previous day: 08/12 0701 - 08/13 0700 In: 9375.7 [I.V.:5930; Blood:1749; IV Piggyback:1696.8] Out: 3510 [Urine:1210; Emesis/NG output:200; Drains:400; Blood:1700] Intake/Output this shift: No intake/output data recorded.  GI: soft drains bloody  Lab Results:  Recent Labs    10/03/20 0232 10/03/20 0407 10/03/20 0608 10/03/20 0615  WBC 15.9*  --  17.2*  --   HGB 10.7*   < > 9.0* 8.2*  HCT 32.8*   < > 28.2* 24.0*  PLT 202  --  175  --    < > = values in this interval not displayed.   BMET Recent Labs    10/03/20 0232 10/03/20 0407 10/03/20 0608 10/03/20 0615  NA 136   < > 136 140  K 5.2*   < > 5.6* 5.5*  CL 109  --  110  --   CO2 18*  --  17*  --   GLUCOSE 167*  --  165*  --   BUN 18  --  18  --   CREATININE 1.46*  --  1.72*  --   CALCIUM 7.4*  --  7.0*  --    < > = values in this interval not displayed.   PT/INR Recent Labs    10/12/2020 1908  LABPROT 18.3*  INR 1.5*   ABG Recent Labs    10/03/20 0407 10/03/20 0615  PHART 7.219* 7.206*  HCO3 18.7* 18.4*    Studies/Results: DG Chest Port 1 View  Result Date: 10/04/2020 CLINICAL DATA:  Post surgery EXAM: PORTABLE CHEST 1 VIEW COMPARISON:  10/01/2020, 10/12/2015 FINDINGS: Interval intubation, tip of the endotracheal tube is about 3 cm superior to the carina. Esophageal tube tip below the diaphragm but incompletely visualized. Right IJ catheter sheath over the SVC confluence. New airspace disease the right base. Normal cardiac size. Aortic  atherosclerosis. No definitive pneumothorax. Gas within the subcutaneous soft tissues over the axillary regions. IMPRESSION: 1. Placement of support lines and tubes as above. 2. New soft tissue gas over the axillary regions bilaterally 3. New airspace disease at the right base which could be due to atelectasis or possibly aspiration Electronically Signed   By: Donavan Foil M.D.   On: 10/19/2020 20:20   DG Abd Portable 1V  Result Date: 09/26/2020 CLINICAL DATA:  OG tube placement EXAM: PORTABLE ABDOMEN - 1 VIEW COMPARISON:  None. FINDINGS: Esophageal tube tip and side port overlie the stomach. Nonobstructed gas pattern with moderate stool. Drain over the pelvis. Bilateral ureteral stents left lower than right but stable in configuration since CT from 8 9. The left ureteral stent pigtail appears positioned within the proximal left ureter on CT. IMPRESSION: 1. Esophageal tube tip and side port overlie the stomach. 2. Nonobstructed gas pattern 3. Bilateral ureteral stents, left lower than right, on the prior CT, proximal pigtail visible within the proximal left ureter. Electronically Signed   By: Donavan Foil M.D.   On: 09/26/2020 20:24    Anti-infectives: Anti-infectives (From admission, onward)    Start  Dose/Rate Route Frequency Ordered Stop   10/03/20 1000  ceFEPIme (MAXIPIME) 2 g in sodium chloride 0.9 % 100 mL IVPB        2 g 200 mL/hr over 30 Minutes Intravenous Every 24 hours 10/03/20 0831     10/01/20 0800  cefTRIAXone (ROCEPHIN) 2 g in sodium chloride 0.9 % 100 mL IVPB  Status:  Discontinued        2 g 200 mL/hr over 30 Minutes Intravenous Every 24 hours 10/01/20 0700 10/03/20 0754   09/30/20 1830  vancomycin (VANCOREADY) IVPB 500 mg/100 mL        500 mg 100 mL/hr over 60 Minutes Intravenous Every 24 hours 10/07/2020 2245     10/07/2020 2345  ceFEPIme (MAXIPIME) 2 g in sodium chloride 0.9 % 100 mL IVPB  Status:  Discontinued        2 g 200 mL/hr over 30 Minutes Intravenous Every 12 hours  09/27/2020 2250 10/01/20 0653   10/16/2020 1731  vancomycin (VANCOCIN) 1-5 GM/200ML-% IVPB       Note to Pharmacy: Cameron Sprang   : cabinet override      10/03/2020 1731 09/30/20 0544       Assessment/Plan: POD 1 removal infected aortic graft, extraanatomic bypass, duodenal serosal tear -care per vascular surgery and ccm -drain near duodenum without bile -continue ng tube -would avoid cortrak placement and hold on trickle feeds  Rolm Bookbinder 10/03/2020

## 2020-10-03 NOTE — Progress Notes (Signed)
Pharmacy Antibiotic Note  [name] is a 75 y.o. female admitted on 10/07/2020 with infected AFB graft. Pharmacy has been consulted for vancomycin and cefepime.   Vancomycin for methicillin resistant staph epi in AFB graft. On omatacycline PTA. All cultutures so far NGTD.  Cefepime for suspected PNA.   WBCs 8.6>> 17.2 Chest X-ray showing consolidations  Plan: Cefepime 2g q24 for CrCl of 19 on 8/13 Vancomycin '500mg'$  IV q24h - est AUC 469 Follow Cr, LOT, cultures Vancomycin level ordered for 8/14 1900 due to worsening renal function  Height: 4' 11.02" (149.9 cm) Weight: 50.2 kg (110 lb 10.7 oz) IBW/kg (Calculated) : 43.24  Temp (24hrs), Avg:95.6 F (35.3 C), Min:91 F (32.8 C), Max:96.8 F (36 C)  Recent Labs  Lab 09/30/20 0507 09/30/20 0758 10/16/2020 0318 10/18/2020 1314 09/27/2020 1319 10/03/2020 1908 10/03/20 0232 10/03/20 0608  WBC 8.6  --  11.5*  --   --  7.1 15.9* 17.2*  CREATININE 0.95   < > 0.91 0.80 0.60 1.02* 1.46* 1.72*  LATICACIDVEN  --   --   --   --   --   --   --  4.9*   < > = values in this interval not displayed.     Estimated Creatinine Clearance: 19 mL/min (A) (by C-G formula based on SCr of 1.72 mg/dL (H)).    Allergies  Allergen Reactions   Penicillins Swelling   Citalopram Other (See Comments)    Nightmares 09/28/2020 Pt states that she is currently taking this drug.   Morphine And Related Nausea And Vomiting   Losartan Nausea Only and Other (See Comments)    Nausea and dizzy. 10/06/2020 Pt states she does not recall having an intolerance to this drug.    Antimicrobials this admission: Zosyn 8/9 >>  Vancomycin 8/9 >>  Cefepime 8/9>> 8/13>> Ceftriaxone 8/11>> 8/13   Thank you for allowing pharmacy to be a part of this patient's care.   Cathrine Muster, PharmD PGY2 Cardiology Pharmacy Resident Phone: [phone] 10/03/2020  8:35 AM Please check [email or web address] for unit-specific pharmacy phone numbers.

## 2020-10-03 NOTE — Progress Notes (Addendum)
Subjective  - POD #1, status post bilateral ax femoral bypass graft and removal of infected aortobifemoral graft  Remained critically ill overnight with hypotension requiring blood pressure support   Physical Exam:  Intubated sedated Pedal pulses are nonpalpable however extremities are warm without mottling Dressings are clean dry and intact Abdomen soft nontender       Assessment/Plan:  POD #1  Cardiovascular: The patient has required neoovernight for hypotension.  She has been responsive to fluids.  Currently we are weaning down her near as she has received more volume.  Initial troponins were unremarkable. Pulmonary: Continue ventilator support.  Will wean as tolerated.  X-ray shows possible infiltrate and so she has been started on antibiotics empirically.  Appreciate CCM assistance GI: NG tube remains in place as does right upper quadrant drain.  No tube feeds or oral intake given duodenal injury.  No Corpak should be placed.  Her NG tube should not be replaced if it is accidentally removed.  Appreciate GI surgery assistance. Renal: Renal output has been marginal overnight.  She has had a slight increase in her creatinine.  She has also been hypokalemic.  Would not be surprised if she has further decline in her renal function given her suprarenal clamp time as well as her hypotension.  We will closely monitor this. Acute blood loss anemia: No active bleeding however I did give her 1 unit of blood last night given her hypotension.  We will continue to monitor this. Urology: Ureteral injury repaired in the operating room by urology.  JP remains in place.  Appreciate urology assistance. Prophylaxis: On Protonix.  We will start subcu heparin tomorrow.  SCDs.  Wells Zanaria Morell 10/03/2020 8:53 AM --  Vitals:   10/03/20 0800 10/03/20 0824  BP: (!) 148/66   Pulse: 65 62  Resp: (!) 28 (!) 28  Temp:  97.7 F (36.5 C)  SpO2: 100% 100%    Intake/Output Summary (Last 24 hours) at  10/03/2020 0853 Last data filed at 10/03/2020 0700 Gross per 24 hour  Intake 9060.72 ml  Output 3510 ml  Net 5550.72 ml     Laboratory CBC    Component Value Date/Time   WBC 17.2 (H) 10/03/2020 0608   HGB 8.2 (L) 10/03/2020 0615   HCT 24.0 (L) 10/03/2020 0615   PLT 175 10/03/2020 0608    BMET    Component Value Date/Time   NA 140 10/03/2020 0615   NA 142 07/03/2019 1116   K 5.5 (H) 10/03/2020 0615   CL 110 10/03/2020 0608   CO2 17 (L) 10/03/2020 0608   GLUCOSE 165 (H) 10/03/2020 0608   BUN 18 10/03/2020 0608   BUN 21 07/03/2019 1116   CREATININE 1.72 (H) 10/03/2020 0608   CALCIUM 7.0 (L) 10/03/2020 0608   GFRNONAA 30 (L) 10/03/2020 0608   GFRAA 57 (L) 07/03/2019 1116    COAG Lab Results  Component Value Date   INR 1.5 (H) 10/16/2020   No results found for: PTT  Antibiotics Anti-infectives (From admission, onward)    Start     Dose/Rate Route Frequency Ordered Stop   10/03/20 1000  ceFEPIme (MAXIPIME) 2 g in sodium chloride 0.9 % 100 mL IVPB        2 g 200 mL/hr over 30 Minutes Intravenous Every 24 hours 10/03/20 0831     10/01/20 0800  cefTRIAXone (ROCEPHIN) 2 g in sodium chloride 0.9 % 100 mL IVPB  Status:  Discontinued        2  g 200 mL/hr over 30 Minutes Intravenous Every 24 hours 10/01/20 0700 10/03/20 0754   09/30/20 1830  vancomycin (VANCOREADY) IVPB 500 mg/100 mL        500 mg 100 mL/hr over 60 Minutes Intravenous Every 24 hours 10/21/2020 2245     10/03/2020 2345  ceFEPIme (MAXIPIME) 2 g in sodium chloride 0.9 % 100 mL IVPB  Status:  Discontinued        2 g 200 mL/hr over 30 Minutes Intravenous Every 12 hours 09/23/2020 2250 10/01/20 0653   10/03/2020 1731  vancomycin (VANCOCIN) 1-5 GM/200ML-% IVPB       Note to Pharmacy: Cameron Sprang   : cabinet override      10/05/2020 1731 09/30/20 0544        V. Leia Alf, M.D., [facility] [place] Office: [phone] Pager:  [phone]

## 2020-10-03 NOTE — Progress Notes (Signed)
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  Has had low urine output.  Creatinine this morning was 1.72 from 1.46.  Is having a fair amount of output from JP drains.  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[place])   Endotracheally intubated   Encounter for postanesthesia care   Hypomagnesemia   Hypothyroidism   Encounter for central line placement   Acute respiratory failure with hypoxia ([place])   Other mechanical complication of femoral arterial graft (bypass), initial encounter ([place])  Current Facility-Administered Medications  Medication Dose Route Frequency Provider Last Rate Last Admin   0.9 %  sodium chloride infusion (Manually program via Guardrails IV Fluids)   Intravenous Once Setzer, Edman Circle, PA-C       0.9 %  sodium chloride infusion (Manually program via Guardrails IV Fluids)   Intravenous Once Barbie Banner, PA-C       0.9 %  sodium chloride infusion   Intravenous PRN Elam Dutch, MD 10 mL/hr at 10/03/20 0700 Infusion Verify at 10/03/20 0700   acetaminophen (TYLENOL) tablet 325-650 mg  325-650 mg Oral Q4H PRN Barbie Banner, PA-C       Or   acetaminophen (TYLENOL) suppository 325-650 mg  325-650 mg Rectal Q4H PRN Barbie Banner, PA-C       albuterol (PROVENTIL) (2.5 MG/3ML) 0.083% nebulizer solution 2.5 mg  2.5 mg Nebulization Q4H PRN Noemi Chapel P, DO   2.5 mg at 10/03/20 0758   arformoterol (BROVANA) nebulizer  solution 15 mcg  15 mcg Nebulization BID Mick Sell, PA-C   15 mcg at 10/03/20 5462   bisacodyl (DULCOLAX) suppository 10 mg  10 mg Rectal Daily PRN Barbie Banner, PA-C       budesonide (PULMICORT) nebulizer solution 0.25 mg  0.25 mg Nebulization BID Andres Labrum D, PA-C   0.25 mg at 10/03/20 7035   calcium carbonate (OS-CAL - dosed in mg of elemental calcium) tablet 500 mg of elemental calcium  1 tablet Oral Daily Barbie Banner, PA-C   500 mg of elemental calcium at 10/01/20 0942   ceFEPIme (MAXIPIME) 2 g in sodium chloride 0.9 % 100 mL IVPB  2 g Intravenous Q24H Ruta Hinds E, MD 200 mL/hr at 10/03/20 1050 2 g at 10/03/20 1050   chlorhexidine gluconate (MEDLINE KIT) (PERIDEX) 0.12 % solution 15 mL  15 mL Mouth Rinse BID Elam Dutch, MD   15 mL at 10/13/2020 2047   Chlorhexidine Gluconate Cloth 2 % PADS 6 each  6 each Topical Daily Setzer, Edman Circle, PA-C       Chlorhexidine Gluconate Cloth 2 % PADS 6 each  6 each Topical Daily Barbie Banner, PA-C   6 each at 10/01/2020 2200   diphenhydrAMINE (BENADRYL) injection 25 mg  25 mg Intravenous Q6H PRN Barbie Banner, PA-C   25 mg at 10/01/20 1738   feeding supplement (ENSURE ENLIVE / ENSURE PLUS) liquid 237 mL  237 mL Oral BID BM Barbie Banner, PA-C   237 mL at 10/01/20 1648  fentaNYL (SUBLIMAZE) bolus via infusion 25-100 mcg  25-100 mcg Intravenous Q15 min PRN Andres Labrum D, PA-C       fentaNYL 2569mg in NS 2588m(1032mml) infusion-PREMIX  25-200 mcg/hr Intravenous Continuous PayMick SellA-C 12.5 mL/hr at 10/03/20 0700 125 mcg/hr at 10/03/20 0700   guaiFENesin-dextromethorphan (ROBITUSSIN DM) 100-10 MG/5ML syrup 15 mL  15 mL Oral Q4H PRN SetBarbie BannerA-C       hydrALAZINE (APRESOLINE) injection 5 mg  5 mg Intravenous Q20 Min PRN Setzer, SanEdman CircleA-C       HYDROmorphone (DILAUDID) injection 1 mg  1 mg Intravenous Q2H PRN SetBarbie BannerA-C       insulin aspart (novoLOG) injection 0-9 Units  0-9 Units Subcutaneous Q4H  PayAndres Labrum PA-C   2 Units at 10/03/20 [id number] (NORMODYNE) injection 10 mg  10 mg Intravenous Q10 min PRN Setzer, SanEdman CircleA-C       levothyroxine (SYNTHROID, LEVOTHROID) injection 50 mcg  50 mcg Intravenous Daily JeoRenee PainD   50 mcg at 10/03/20 1051   magnesium sulfate IVPB 2 g 50 mL  2 g Intravenous Daily PRN SetBarbie BannerA-C       MEDLINE mouth rinse  15 mL Mouth Rinse 10 times per day FieElam DutchD   15 mL at 10/03/20 1052   metoprolol tartrate (LOPRESSOR) injection 5 mg  5 mg Intravenous Q6H JeoRenee PainD       pantoprazole (PROTONIX) injection 40 mg  40 mg Intravenous QHS JeoRenee PainD   40 mg at 10/16/2020 2155   phenol (CHLORASEPTIC) mouth spray 1 spray  1 spray Mouth/Throat PRN SetBarbie BannerA-C       phenylephrine CONCENTRATED 100m53m sodium chloride 0.9% 250mL25m4mg/m33mpremix infusion  0-400 mcg/min Intravenous Titrated FieldsElam Dutch1 mL/hr at 10/03/20 0700 140 mcg/min at 10/03/20 0700   polyethylene glycol (MIRALAX / GLYCOLAX) packet 17 g  17 g Oral Daily PRN SetzerBarbie Banner       potassium chloride SA (KLOR-CON) CR tablet 20-40 mEq  20-40 mEq Oral Daily PRN Setzer, SandraEdman Circle       propofol (DIPRIVAN) 1000 MG/100ML infusion  0-50 mcg/kg/min Intravenous Continuous Payne,Mick Sell 6.02 mL/hr at 10/03/20 0700 20 mcg/kg/min at 10/03/20 0700   sodium bicarbonate 150 mEq in dextrose 5 % 1,150 mL infusion   Intravenous Continuous Smith,Mauri Brooklyn00 mL/hr at 10/03/20 0700 Infusion Verify at 10/03/20 0700   sodium chloride flush (NS) 0.9 % injection 10-40 mL  10-40 mL Intracatheter Q12H SetzerBarbie Banner   10 mL at 10/05/2020 2156   sodium chloride flush (NS) 0.9 % injection 10-40 mL  10-40 mL Intracatheter PRN SetzerBarbie Banner       vancomycin (VANCOREADY) IVPB 500 mg/100 mL  500 mg Intravenous Q24H SetzerBarbie Banner   Stopped at 10/01/20 1804     Objective: Vital: Vitals:   10/03/20 0824  10/03/20 0900 10/03/20 1000 10/03/20 1100  BP:  (!) 121/53 118/62 (!) 118/58  Pulse: 62 63 63 63  Resp: (!) 28 (!) 28 (!) 28 (!) 28  Temp: 97.7 F (36.5 C) (!) 95 F (35 C) (!) 95 F (35 C) 98.1 F (36.7 C)  TempSrc: Oral   Oral  SpO2: 100% 100% 100% 100%  Weight:      Height:       I/Os: I/O last 3  completed shifts: In: 9875.7 [I.V.:6430; Blood:1749; IV Piggyback:1696.8] Out: 5125 [Urine:2825; Emesis/NG output:200; Drains:400; Blood:1700]  Physical Exam:  General: Intubated and sedated Lungs: on ventilator GI: Abdomen soft. JP drain with serosanguinous drainage Foley: Draining light red urine. Ext: lower extremities symmetric  Lab Results: Recent Labs    10/15/2020 1908 10/19/2020 1938 10/03/20 0232 10/03/20 0407 10/03/20 0608 10/03/20 0615 10/03/20 0952  WBC 7.1  --  15.9*  --  17.2*  --   --   HGB 9.4*   < > 10.7*   < > 9.0* 8.2* 7.8*  HCT 29.9*   < > 32.8*   < > 28.2* 24.0* 23.0*   < > = values in this interval not displayed.   Recent Labs    10/04/2020 1908 10/10/2020 1938 10/03/20 0232 10/03/20 0407 10/03/20 0608 10/03/20 0615 10/03/20 0952  NA 134*   < > 136   < > 136 140 142  K 4.4   < > 5.2*   < > 5.6* 5.5* 5.1  CL 106  --  109  --  110  --   --   CO2 19*  --  18*  --  17*  --   --   GLUCOSE 203*  --  167*  --  165*  --   --   BUN 14  --  18  --  18  --   --   CREATININE 1.02*  --  1.46*  --  1.72*  --   --   CALCIUM 7.3*  --  7.4*  --  7.0*  --   --    < > = values in this interval not displayed.   Recent Labs    09/25/2020 1908  INR 1.5*   No results for input(s): LABURIN in the last 72 hours. Results for orders placed or performed during the hospital encounter of 10/21/2020  SARS CORONAVIRUS 2 (TAT 6-24 HRS) Nasopharyngeal Nasopharyngeal Swab     Status: None   Collection Time: 09/28/2020  1:27 PM   Specimen: Nasopharyngeal Swab  Result Value Ref Range Status   SARS Coronavirus 2 NEGATIVE NEGATIVE Final    Comment: (NOTE) SARS-CoV-2 target  nucleic acids are NOT DETECTED.  The SARS-CoV-2 RNA is generally detectable in upper and lower respiratory specimens during the acute phase of infection. Negative results do not preclude SARS-CoV-2 infection, do not rule out co-infections with other pathogens, and should not be used as the sole basis for treatment or other patient management decisions. Negative results must be combined with clinical observations, patient history, and epidemiological information. The expected result is Negative.  Fact Sheet for Patients: SugarRoll.be  Fact Sheet for Healthcare Providers: https://www.woods-mathews.com/  This test is not yet approved or cleared by the [place] FDA and  has been authorized for detection and/or diagnosis of SARS-CoV-2 by FDA under an Emergency Use Authorization (EUA). This EUA will remain  in effect (meaning this test can be used) for the duration of the COVID-19 declaration under Se ction 564(b)(1) of the Act, 21 U.S.C. section 360bbb-3(b)(1), unless the authorization is terminated or revoked sooner.  Performed at [facility], [place] [address] [place], [place] [address]   Aerobic/Anaerobic Culture w Gram Stain (surgical/deep wound)     Status: None (Preliminary result)   Collection Time: 10/19/2020  6:45 PM   Specimen: Groin, Left; Wound  Result Value Ref Range Status   Specimen Description WOUND LEFT GROIN  Final   Special Requests PT ON [facility]  Final   Gram Stain NO WBC SEEN NO ORGANISMS SEEN   Final   Culture   Final    NO GROWTH 3 DAYS NO ANAEROBES ISOLATED; CULTURE IN PROGRESS FOR 5 DAYS Performed at [facility], [place] [address], [place], [place] [address]    Report Status PENDING  Incomplete  MRSA Next Gen by PCR, Nasal     Status: None   Collection Time: 10/07/2020 10:38 PM   Specimen: Nasal Mucosa; Nasal Swab  Result Value Ref Range Status   MRSA by PCR Next Gen NOT DETECTED NOT DETECTED Final     Comment: (NOTE) The GeneXpert MRSA Assay (FDA approved for NASAL specimens only), is one component of a comprehensive MRSA colonization surveillance program. It is not intended to diagnose MRSA infection nor to guide or monitor treatment for MRSA infections. Test performance is not FDA approved in patients less than 39 years old. Performed at [facility], [place] [address] [place], [place] [address]   Culture, blood (routine x 2)     Status: None (Preliminary result)   Collection Time: 09/30/20 10:37 AM   Specimen: BLOOD LEFT HAND  Result Value Ref Range Status   Specimen Description BLOOD LEFT HAND  Final   Special Requests   Final    BOTTLES DRAWN AEROBIC AND ANAEROBIC Blood Culture results may not be optimal due to an inadequate volume of blood received in culture bottles   Culture   Final    NO GROWTH 2 DAYS Performed at [facility], [place] [address] [place], [place] [address]    Report Status PENDING  Incomplete  Culture, blood (routine x 2)     Status: None (Preliminary result)   Collection Time: 09/30/20 10:43 AM   Specimen: BLOOD LEFT ARM  Result Value Ref Range Status   Specimen Description BLOOD LEFT ARM  Final   Special Requests   Final    BOTTLES DRAWN AEROBIC AND ANAEROBIC Blood Culture adequate volume   Culture   Final    NO GROWTH 2 DAYS Performed at [facility], [place] [address] [place], [place] [address]    Report Status PENDING  Incomplete  Gram stain     Status: None   Collection Time: 10/18/2020  8:50 AM   Specimen: Wound  Result Value Ref Range Status   Specimen Description WOUND  Final   Special Requests NONE  Final   Gram Stain   Final    RARE WBC PRESENT,BOTH PMN AND MONONUCLEAR NO ORGANISMS SEEN RESULT CALLED TO, READ BACK BY AND VERIFIED WITH: RN M.GOIN AT 4270 ON 10/09/2020 BY T.SAAD. Performed at [facility], [place] [address] [place], [place] [address]    Report Status 10/13/2020 FINAL  Final  Aerobic/Anaerobic Culture w  Gram Stain (surgical/deep wound)     Status: None (Preliminary result)   Collection Time: 09/25/2020  2:25 PM   Specimen: PATH Other  Result Value Ref Range Status   Specimen Description WOUND  Final   Special Requests AROTIC GRAFT  Final   Gram Stain   Final    RARE WBC PRESENT,BOTH PMN AND MONONUCLEAR NO ORGANISMS SEEN Performed at [facility], [place] [address] [place], [place] [address]    Culture PENDING  Incomplete   Report Status PENDING  Incomplete  Aerobic/Anaerobic Culture w Gram Stain (surgical/deep wound)     Status: None (Preliminary result)   Collection Time: 09/21/2020  2:56 PM   Specimen: PATH  Other  Result Value Ref Range Status   Specimen Description WOUND  Final   Special Requests RIGHT LIMB OF AORTIC GRAFT  Final   Gram Stain   Final    FEW WBC PRESENT,BOTH PMN AND MONONUCLEAR NO ORGANISMS SEEN Performed at [facility], [address], [place], [place] [address]    Culture PENDING  Incomplete   Report Status PENDING  Incomplete    Studies/Results: DG Retrograde Pyelogram  Result Date: 10/03/2020 CLINICAL DATA:  Ureteral stent placement EXAM: ABDOMINAL FLUOROSCOPY COMPARISON:  CT 09/25/2020 FINDINGS: 2 fluoroscopic spot images document placement of bilateral double-j ureteral stents. IMPRESSION: Bilateral ureteral stent placement Electronically Signed   By: Lucrezia Europe M.D.   On: 10/03/2020 11:12   Portable Chest xray  Result Date: 10/03/2020 CLINICAL DATA:  Reason for exam: acute respiratory failure with hypoxia Hx of carotid artery occlusion, MI, stroke EXAM: PORTABLE CHEST - 1 VIEW COMPARISON:  the previous day's study FINDINGS: Endotracheal tube, nasogastric tube, and right IJ central venous catheter remain in place. Lungs are clear. Small right pleural effusion. Heart size normal.  Aortic Atherosclerosis (ICD10-170.0). No pneumothorax. Skin staples project over the axillary regions. Missing dentition. Cholecystectomy clips. Right ureteral stent  proximal aspect is noted. IMPRESSION: 1. Support hardware stable in position. 2. small right pleural effusion. Electronically Signed   By: Lucrezia Europe M.D.   On: 10/03/2020 11:14   DG Chest Port 1 View  Result Date: 10/17/2020 CLINICAL DATA:  Post surgery EXAM: PORTABLE CHEST 1 VIEW COMPARISON:  09/30/2020, 10/12/2015 FINDINGS: Interval intubation, tip of the endotracheal tube is about 3 cm superior to the carina. Esophageal tube tip below the diaphragm but incompletely visualized. Right IJ catheter sheath over the SVC confluence. New airspace disease the right base. Normal cardiac size. Aortic atherosclerosis. No definitive pneumothorax. Gas within the subcutaneous soft tissues over the axillary regions. IMPRESSION: 1. Placement of support lines and tubes as above. 2. New soft tissue gas over the axillary regions bilaterally 3. New airspace disease at the right base which could be due to atelectasis or possibly aspiration Electronically Signed   By: Donavan Foil M.D.   On: 10/04/2020 20:20   DG Abd Portable 1V  Result Date: 10/13/2020 CLINICAL DATA:  OG tube placement EXAM: PORTABLE ABDOMEN - 1 VIEW COMPARISON:  None. FINDINGS: Esophageal tube tip and side port overlie the stomach. Nonobstructed gas pattern with moderate stool. Drain over the pelvis. Bilateral ureteral stents left lower than right but stable in configuration since CT from 8 9. The left ureteral stent pigtail appears positioned within the proximal left ureter on CT. IMPRESSION: 1. Esophageal tube tip and side port overlie the stomach. 2. Nonobstructed gas pattern 3. Bilateral ureteral stents, left lower than right, on the prior CT, proximal pigtail visible within the proximal left ureter. Electronically Signed   By: Donavan Foil M.D.   On: 09/26/2020 20:24    Assessment: Right ureteral injury status post right ureteroureterostomy  Procedure(s): REMOVAL OF INFECTED AORTA BI-FEMORAL BYPASS GRAFT RIGTH AXILLARY TO  RIGHT FEMORAL BYPASS  GRAFT LEFT AXILLARY TO LEFT FEMORAL BYPASS GRAFT SMALL BOWEL REPAIR PATCH ANGIOPLASTY EXPLORATORY LAPAROTOMY INSERTION OF 102F x 26cm CONTOUR STENT IN RIGHT URETER AND RIGHT URETEROURETEROSTOMY, 1 Day Post-Op  doing well.  Plan: Send JP creatinine.  Continue Foley catheter.  I irrigated the bladder with about 100 cc gently.  This irrigated easily.  Did not appear to have any clots in the bladder.  Continue JP x2.   Cornelia Copa  Gloriann Loan, MD Urology 10/03/2020, 11:22 AM

## 2020-10-03 NOTE — Progress Notes (Signed)
NAME:  [name], MRN:  [id number], DOB:  [birth year], LOS: 4 ADMISSION DATE:  10/04/2020, CONSULTATION DATE:  8/12 REFERRING MD:  Dr. Oneida Alar, CHIEF COMPLAINT:  removal of infected aorta bi-femoral bypass graft; placement of R axillay BI-FEMORAL BYPASS GRAFT and LEFT AXILLARY POPLITEAL BYPASS GRAFT   History of Present Illness:  Patient is a 75 yo F w/ pertinent PMH of carotid artery occlusion, CVA, hyper otitis, HTN, HLD, hypothyroidism, IBS, hydronephrosis, COPD, MI, PVD, PAF, aortobifemoral bypass graft 10 years ago presents to [facility] on 10/07/2020 with left femoral stent bleeding and infection.  Patient has been on Omadacycline for 2 days prior to admission for infection and has recently noticed profuse bleeding from the site. Has had I&D of left groin in December 2021. Bleeding was stopped and EMS called to bring to [facility] on 8/9. Last dose of Eliquis for PAF on 8/8. Hgb and bp stable. Vascular surgery consulted. On 8/9 Status postrepair of left femoral anastomosis with bovine pericardial patch for bleeding surgery performed. 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 Started on Vanc/cefepime. Infectious disease consulted 8/10. On Vanc and ceftriaxone.  On 8/12, Vascular surgery  performed: REMOVAL OF AORTA BI-FEMORAL BYPASS GRAFT, placed RIGTH AXILLARY BI-FEMORAL BYPASS GRAFT and LEFT AXILLARY POPLITEAL BYPASS GRAFT. There was small serosal injury to duodenum s/p procedure. General Surgery consulted. Patched with omentum and left NG tube with drain. Urology placed Right ureteroureterostomy with ureteral stent placement/exchange for complete mid ureteral transection. Patient arrived to ICU intubated.  PCCM consulted for management of mechanical ventilation and medical management.  Pertinent  Medical History   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   Cerebrovascular occlusion 2001   Extracranial with Hx of TIA's   Depression    Hepatitis     Hyperlipidemia    IBS (irritable bowel syndrome)    Myocardial infarction [facility])    Peripheral vascular disease ([place])    had Carotid En   Rheumatic fever    Stroke ([place])    no residual- except left side of face has a small "twist"   Thyroid disease    Hypothyroidism     Significant Hospital Events: Including procedures, antibiotic start and stop dates in addition to other pertinent events   8/12: pccm consulted for management of mechanical ventilation s/p surgery placement of RIGHT axillay BI-FEMORAL BYPASS GRAFT and LEFT AXILLARY POPLITEAL BYPASS GRAFT   Interim History / Subjective:  Low UOP overnight-- dropped off when more hypotensive this morning. CVP increased from 2-4 with 2L NS bolus. 2 amps bicarb given.  JP drain with 100cc in the past 1 hour, 400cc total overnight. Thick ETT secretions.  Objective   Blood pressure (!) 148/66, pulse 62, temperature 97.7 F (36.5 C), temperature source Oral, resp. rate (!) 28, height 4' 11.02" (1.499 m), weight 50.2 kg, SpO2 100 %. CVP:  [2 mmHg-10 mmHg] 5 mmHg  Vent Mode: PRVC FiO2 (%):  [50 %-100 %] 80 % Set Rate:  [20 bmp-28 bmp] 28 bmp Vt Set:  [450 mL] 450 mL PEEP:  [5 cmH20] 5 cmH20 Plateau Pressure:  [16 cmH20] 16 cmH20   Intake/Output Summary (Last 24 hours) at 10/03/2020 0844 Last data filed at 10/03/2020 0700 Gross per 24 hour  Intake 9060.72 ml  Output 3510 ml  Net 5550.72 ml   Filed Weights   10/20/2020 2230 09/30/20 0600 10/01/20 0500  Weight: 45.6 kg 48.2 kg 50.2 kg    Examination:  General:   critically ill appearing woman lying in bed in NAD HEENT: [place]/AT, eyes anicteric, ETT in place. Neuro: RASS -4, winces during exam. Able to follow simple commands. CV: S1S2, RRR PULM:  CTAB, no wheezing. Prolonged exhalation on vent.  GI: soft, TTP. Blood on dressing around JP drain, dark blood in drain. Skin: Pallor, no rashes. GU: foley with minimal bloody UOP  CXR> RLL infiltrate with silhouetting of lateral  hemidiaphragm Surgical graft cultures pending Blood cultures 8/10 NGTD Labs reviewed: K+ 5.6, bicarb 18, Cr 1.72  Resolved Hospital Problem list     Assessment & Plan:  Aortobifemoral bypass graft infection, unknown organism Removal of infected aorta bi-fem bypass graft Right axillary bi-fem bypass graft Axillary popliteal bypass graft 500 cc blood loss; resuscitated with cell saver and 4 RBCs intraoperatively P: -con't holding eliquis -con't broad spectrum antibiotics; ceftriaxone escalated to cefepime due to concern for pneumonia. Con't vanc. -agree with 1 unit pRBCs now due to concern for potential bleeding; transfuse for Hb <7 or hemodynamically significant bleeding -con't holding eliquis  -maintain MAP >65 and SBP >90 -ID following for graft infection; follow cultures until finalized  Small serosal injury to duodenum s/p repair: Patched with omentum and left NG tube with drain.  High risk for ileus given extensive vascular surgery -Appreciate general surgery's management -Per surgery, would wait several days before initiating tube feeds given extent of surgery and need for laceration repair of duodenum. -No Cortrak without talking to general surgery> high risk for perforation given area of repair -NPO, NGT to LIWS  Right ureteroureterostomy 8/12 with ureteral stent placement/exchange for complete mid ureteral transection.  P: -Appreciate Urology's management  -Continue Foley catheter for minimum 10 days -keep ureteral stent at least 6 weeks -Strict I's/O - Volume resuscitation today  Acute respiratory failure with hypoxia s/p surgical procedure requiring mechanical ventilation Concern for RLL HAP P: -Titrating down FiO2 today - SAT and SBT if her saturations remain stable on lower FiO2 - LTVV, plateau pressure goal. - Pad protocol for sedation. -VAP prevention protocol - Collecting trach aspirate - Empirically broadening gram-negative coverage of antibiotics.   Continue vancomycin. -DuoNebs as needed, continue Brovana and Pulmicort twice daily  Shock, concern for hypovolemic due to bleeding and third spacing. Sedation likely contributing. Worsened acutely this morning, responding to volume resuscitation. -volume resuscitation -wean pressors to maintain MAP >65 and SBP >90  AKI with oliguria Hyperkalemia -additional volume resuscitation now -Strict I's/O - Repeat BMP later this morning.  If hyperkalemia persists may have to consider dialysis early in her course due to inability to use GI tract to manage potassium. -Continue bicarbonate infusion  Hyperglycemia, prediabetes P: -Sliding scale insulin as needed - Goal BG 140-180 while admitted to the ICU  Hypomagnesemia P: -repleted; goal mag >2 -Recheck later this morning  Thrombocytopenia Acute on chronic anemia P: -repeat CBC after blood and volume resuscitation -transfuse for hgb <7 or hemodynamically significant bleeding  Hypothermia P: -continue warming  Hx of COPD P: -Continue pulmicort & brovana while intubated; holding PTA symbicort  -prn duo-nebs  hypothyroidism -continue synthroid  H/o HTN Paroxysmal Afib -hold home beta blocker while on pressors; can use amiodarone if HR control is required -hold home Eliquis  HLD -hold statin while NPO -monitor LFTs  Will update family at bedside this morning.   Best Practice (right click and "Reselect all SmartList Selections" daily)   Diet/type: NPO DVT prophylaxis: SCD GI prophylaxis: PPI Lines: Central line and Arterial Line Foley:  Yes, and  it is still needed Code Status:  full code Last date of multidisciplinary goals of care discussion [pending]  Labs   CBC: Recent Labs  Lab 09/22/2020 1226 09/28/2020 1844 09/30/20 0507 10/19/2020 0318 09/23/2020 0826 10/19/2020 1908 10/16/2020 1938 10/03/20 0232 10/03/20 0407 10/03/20 0608 10/03/20 0615  WBC 8.5  --  8.6 11.5*  --  7.1  --  15.9*  --  17.2*  --   NEUTROABS  5.6  --   --   --   --   --   --   --   --   --   --   HGB 10.6*   < > 9.3* 8.4*   < > 9.4* 8.8* 10.7* 9.5* 9.0* 8.2*  HCT 34.6*   < > 30.4* 27.7*   < > 29.9* 26.0* 32.8* 28.0* 28.2* 24.0*  MCV 86.9  --  86.9 89.9  --  90.6  --  90.4  --  91.9  --   PLT 243  --  216 159  --  95*  --  202  --  175  --    < > = values in this interval not displayed.    BMP Latest Ref Rng & Units 10/03/2020 10/03/2020 10/03/2020  Glucose 70 - 99 mg/dL - 165(H) -  BUN 8 - 23 mg/dL - 18 -  Creatinine 0.44 - 1.00 mg/dL - 1.72(H) -  BUN/Creat Ratio 12 - 28 - - -  Sodium 135 - 145 mmol/L 140 136 139  Potassium 3.5 - 5.1 mmol/L 5.5(H) 5.6(H) 5.5(H)  Chloride 98 - 111 mmol/L - 110 -  CO2 22 - 32 mmol/L - 17(L) -  Calcium 8.9 - 10.3 mg/dL - 7.0(L) -    This patient is critically ill with multiple organ system failure which requires frequent high complexity decision making, assessment, support, evaluation, and titration of therapies. This was completed through the application of advanced monitoring technologies and extensive interpretation of multiple databases. During this encounter critical care time was devoted to patient care services described in this note for 60 minutes.   Julian Hy, DO 10/03/20 8:44 AM [facility]

## 2020-10-03 NOTE — Progress Notes (Signed)
Husband and daughter updated at bedside. She has had very labile BPs today with turning.  INR 2.1, LA 4.1  Giving 2 FFP, additional 1L LR bolus.  Julian Hy, DO 10/03/20 2:49 PM [facility]

## 2020-10-03 NOTE — Progress Notes (Signed)
[place] Progress Note Patient Name: [name] DOB: Sep 14, 1945 MRN: [id number]   Date of Service  10/03/2020  HPI/Events of Note  Acidemic on ABG this am with PH 7.21 with both respiratory and metabolic causes.  Poor uop overnight with increasing vasopressor requirement.  Receiving bolus of NS per surgery  eICU Interventions  Increase resp rate to 22 on vent Change NS at 100 cc/hr To Bicarb drip at 100 cc/hr for 12 hrs Repeat abg at 1000     Intervention Category Major Interventions: Acid-Base disturbance - evaluation and management  Mauri Brooklyn, P 10/03/2020, 4:46 AM

## 2020-10-03 NOTE — Progress Notes (Signed)
OT Cancellation Note  Patient Details Name: FRADY BETLER MRN: [id number] DOB: 26-May-1945   Cancelled Treatment:    Reason Eval/Treat Not Completed: Patient not medically ready.  Per RN, patient remains intubated, sedated, etc. Will check back Monday for OT Evaluation as medically appropriate.  Timiko Offutt D Dejae Bernet 10/03/2020, 9:52 AM 10/03/2020  Denice Paradise, OTR/L  Acute Rehabilitation Services  Office:  [phone]

## 2020-10-03 NOTE — Progress Notes (Signed)
PT Cancellation Note  Patient Details Name: [name] MRN: [id number] DOB: [birth year]   Cancelled Treatment:    Reason Eval/Treat Not Completed: Patient not medically ready - remains intubated, sedated, etc. Will check back Monday for PT Evaluation as medically appropriate.  Mabeline Caras, PT, DPT Acute Rehabilitation Services  Pager [phone] Office [place] 10/03/2020, 9:36 AM

## 2020-10-04 ENCOUNTER — Inpatient Hospital Stay (HOSPITAL_COMMUNITY): Payer: Medicare HMO

## 2020-10-04 DIAGNOSIS — D62 Acute posthemorrhagic anemia: Secondary | ICD-10-CM | POA: Diagnosis not present

## 2020-10-04 DIAGNOSIS — Z9911 Dependence on respirator [ventilator] status: Secondary | ICD-10-CM | POA: Diagnosis not present

## 2020-10-04 DIAGNOSIS — J9601 Acute respiratory failure with hypoxia: Secondary | ICD-10-CM | POA: Diagnosis not present

## 2020-10-04 LAB — PREPARE FRESH FROZEN PLASMA
Unit division: 0
Unit division: 0

## 2020-10-04 LAB — CBC
HCT: 19.6 % — ABNORMAL LOW (ref 36.0–46.0)
HCT: 28.6 % — ABNORMAL LOW (ref 36.0–46.0)
Hemoglobin: 6.6 g/dL — CL (ref 12.0–15.0)
Hemoglobin: 9.5 g/dL — ABNORMAL LOW (ref 12.0–15.0)
MCH: 29.9 pg (ref 26.0–34.0)
MCH: 28.8 pg (ref 26.0–34.0)
MCHC: 33.7 g/dL (ref 30.0–36.0)
MCHC: 33.2 g/dL (ref 30.0–36.0)
MCV: 88.7 fL (ref 80.0–100.0)
MCV: 86.7 fL (ref 80.0–100.0)
Platelets: 140 10*3/uL — ABNORMAL LOW (ref 150–400)
Platelets: 123 10*3/uL — ABNORMAL LOW (ref 150–400)
RBC: 2.21 MIL/uL — ABNORMAL LOW (ref 3.87–5.11)
RBC: 3.3 MIL/uL — ABNORMAL LOW (ref 3.87–5.11)
RDW: 15.7 % — ABNORMAL HIGH (ref 11.5–15.5)
RDW: 15.9 % — ABNORMAL HIGH (ref 11.5–15.5)
WBC: 18.2 10*3/uL — ABNORMAL HIGH (ref 4.0–10.5)
WBC: 23 10*3/uL — ABNORMAL HIGH (ref 4.0–10.5)
nRBC: 0.2 % (ref 0.0–0.2)
nRBC: 0.1 % (ref 0.0–0.2)

## 2020-10-04 LAB — BPAM FFP
Blood Product Expiration Date: 202208142359
Blood Product Expiration Date: 202208142359
ISSUE DATE / TIME: 202208131447
ISSUE DATE / TIME: 202208131447
Unit Type and Rh: 6200
Unit Type and Rh: 6200

## 2020-10-04 LAB — BASIC METABOLIC PANEL
Anion gap: 12 (ref 5–15)
BUN: 24 mg/dL — ABNORMAL HIGH (ref 8–23)
CO2: 26 mmol/L (ref 22–32)
Calcium: 7.1 mg/dL — ABNORMAL LOW (ref 8.9–10.3)
Chloride: 102 mmol/L (ref 98–111)
Creatinine, Ser: 2.54 mg/dL — ABNORMAL HIGH (ref 0.44–1.00)
GFR, Estimated: 19 mL/min — ABNORMAL LOW (ref >60)
Glucose, Bld: 115 mg/dL — ABNORMAL HIGH (ref 70–99)
Potassium: 3.6 mmol/L (ref 3.5–5.1)
Sodium: 140 mmol/L (ref 135–145)

## 2020-10-04 LAB — PROTIME-INR
INR: 1.4 — ABNORMAL HIGH (ref 0.8–1.2)
Prothrombin Time: 17.1 seconds — ABNORMAL HIGH (ref 11.4–15.2)

## 2020-10-04 LAB — GLUCOSE, CAPILLARY
Glucose-Capillary: 120 mg/dL — ABNORMAL HIGH (ref 70–99)
Glucose-Capillary: 109 mg/dL — ABNORMAL HIGH (ref 70–99)
Glucose-Capillary: 80 mg/dL (ref 70–99)
Glucose-Capillary: 92 mg/dL (ref 70–99)
Glucose-Capillary: 118 mg/dL — ABNORMAL HIGH (ref 70–99)

## 2020-10-04 LAB — PREPARE RBC (CROSSMATCH)

## 2020-10-04 LAB — AEROBIC/ANAEROBIC CULTURE W GRAM STAIN (SURGICAL/DEEP WOUND)
Culture: NO GROWTH
Gram Stain: NONE SEEN

## 2020-10-04 LAB — VANCOMYCIN, TROUGH: Vancomycin Tr: 16 ug/mL (ref 15–20)

## 2020-10-04 MED ORDER — HEPARIN SODIUM (PORCINE) 5000 UNIT/ML IJ SOLN
5000.0000 [IU] | Freq: Three times a day (TID) | INTRAMUSCULAR | Status: DC
  Administered 2020-10-04 – 2020-10-08 (×11): 5000 [IU] via SUBCUTANEOUS
  Filled 2020-10-04 (×10): qty 1

## 2020-10-04 MED ORDER — VANCOMYCIN VARIABLE DOSE PER UNSTABLE RENAL FUNCTION (PHARMACIST DOSING): Status: DC

## 2020-10-04 MED ORDER — TRACE MINERALS CU-MN-SE-ZN 300-55-60-3000 MCG/ML IV SOLN
INTRAVENOUS | Status: AC
  Filled 2020-10-04: qty 288

## 2020-10-04 MED ORDER — SODIUM CHLORIDE 0.9% IV SOLUTION: Freq: Once | INTRAVENOUS | Status: AC

## 2020-10-04 MED ORDER — ORAL CARE MOUTH RINSE
15.0000 mL | Freq: Two times a day (BID) | OROMUCOSAL | Status: DC
  Administered 2020-10-05 – 2020-10-06 (×3): 15 mL via OROMUCOSAL

## 2020-10-04 MED ORDER — CHLORHEXIDINE GLUCONATE 0.12 % MT SOLN
15.0000 mL | Freq: Two times a day (BID) | OROMUCOSAL | Status: DC
  Administered 2020-10-04 – 2020-10-06 (×5): 15 mL via OROMUCOSAL
  Filled 2020-10-04 (×4): qty 15

## 2020-10-04 MED ORDER — VANCOMYCIN HCL 750 MG/150ML IV SOLN
750.0000 mg | Freq: Once | INTRAVENOUS | Status: AC
  Administered 2020-10-04: 750 mg via INTRAVENOUS
  Filled 2020-10-04: qty 150

## 2020-10-04 MED ORDER — SODIUM CHLORIDE 0.9% IV SOLUTION: Freq: Once | INTRAVENOUS | Status: DC

## 2020-10-04 NOTE — Progress Notes (Signed)
eLink Physician-Brief Progress Note Patient Name: [name] DOB: March 10, 1945 MRN: [id number]   Date of Service  10/04/2020  HPI/Events of Note  Hgb dropped to 6.6 this am without obvious signs of bleeding.  On stable dose of phenylephrine.  eICU Interventions  Transfuse 1 unit PRBC and repeat hgb      Intervention Category Intermediate Interventions: Bleeding - evaluation and treatment with blood products  Mauri Brooklyn, P 10/04/2020, 4:59 AM

## 2020-10-04 NOTE — Progress Notes (Signed)
2 Days Post-Op   Subjective/Chief Complaint: Intubated, ill   Objective: Vital signs in last 24 hours: Temp:  [93.9 F (34.4 C)-99.1 F (37.3 C)] 99 F (37.2 C) (08/14 0830) Pulse Rate:  [63-85] 83 (08/14 0830) Resp:  [0-28] 28 (08/14 0830) BP: (73-172)/(41-75) 139/75 (08/14 0830) SpO2:  [94 %-100 %] 94 % (08/14 0830) Arterial Line BP: (76-152)/(44-62) 118/57 (08/14 0830) FiO2 (%):  [30 %-40 %] 30 % (08/14 0830) Last BM Date:  (PTA)  Intake/Output from previous day: 08/13 0701 - 08/14 0700 In: 4454 [P.O.:11; I.V.:3347; CF:[id number]; IV Piggyback:200] Out: 728 [Urine:83; Emesis/NG output:200; Drains:445] Intake/Output this shift: Total I/O In: 315 [Blood:315] Out: 0   GI: drain nonbilious  Lab Results:  Recent Labs    10/03/20 1047 10/04/20 0340  WBC 15.3* 18.2*  HGB 9.4* 6.6*  HCT 28.6* 19.6*  PLT 155 140*   BMET Recent Labs    10/03/20 1047 10/04/20 0340  NA 139 140  K 4.5 3.6  CL 109 102  CO2 21* 26  GLUCOSE 162* 115*  BUN 20 24*  CREATININE 1.88* 2.54*  CALCIUM 6.8* 7.1*   PT/INR Recent Labs    09/28/2020 1908 10/03/20 1047  LABPROT 18.3* 23.9*  INR 1.5* 2.1*   ABG Recent Labs    10/03/20 0615 10/03/20 0952  PHART 7.206* 7.369  HCO3 18.4* 22.4    Studies/Results: DG Retrograde Pyelogram  Result Date: 10/03/2020 CLINICAL DATA:  Ureteral stent placement EXAM: ABDOMINAL FLUOROSCOPY COMPARISON:  CT 10/09/2020 FINDINGS: 2 fluoroscopic spot images document placement of bilateral double-j ureteral stents. IMPRESSION: Bilateral ureteral stent placement Electronically Signed   By: Lucrezia Europe M.D.   On: 10/03/2020 11:12   Portable Chest xray  Result Date: 10/03/2020 CLINICAL DATA:  Reason for exam: acute respiratory failure with hypoxia Hx of carotid artery occlusion, MI, stroke EXAM: PORTABLE CHEST - 1 VIEW COMPARISON:  the previous day's study FINDINGS: Endotracheal tube, nasogastric tube, and right IJ central venous catheter remain in place. Lungs  are clear. Small right pleural effusion. Heart size normal.  Aortic Atherosclerosis (ICD10-170.0). No pneumothorax. Skin staples project over the axillary regions. Missing dentition. Cholecystectomy clips. Right ureteral stent proximal aspect is noted. IMPRESSION: 1. Support hardware stable in position. 2. small right pleural effusion. Electronically Signed   By: Lucrezia Europe M.D.   On: 10/03/2020 11:14   DG Chest Port 1 View  Result Date: 09/28/2020 CLINICAL DATA:  Post surgery EXAM: PORTABLE CHEST 1 VIEW COMPARISON:  10/14/2020, 10/12/2015 FINDINGS: Interval intubation, tip of the endotracheal tube is about 3 cm superior to the carina. Esophageal tube tip below the diaphragm but incompletely visualized. Right IJ catheter sheath over the SVC confluence. New airspace disease the right base. Normal cardiac size. Aortic atherosclerosis. No definitive pneumothorax. Gas within the subcutaneous soft tissues over the axillary regions. IMPRESSION: 1. Placement of support lines and tubes as above. 2. New soft tissue gas over the axillary regions bilaterally 3. New airspace disease at the right base which could be due to atelectasis or possibly aspiration Electronically Signed   By: Donavan Foil M.D.   On: 10/18/2020 20:20   DG Abd Portable 1V  Result Date: 09/28/2020 CLINICAL DATA:  OG tube placement EXAM: PORTABLE ABDOMEN - 1 VIEW COMPARISON:  None. FINDINGS: Esophageal tube tip and side port overlie the stomach. Nonobstructed gas pattern with moderate stool. Drain over the pelvis. Bilateral ureteral stents left lower than right but stable in configuration since CT from 8 9. The left ureteral stent  pigtail appears positioned within the proximal left ureter on CT. IMPRESSION: 1. Esophageal tube tip and side port overlie the stomach. 2. Nonobstructed gas pattern 3. Bilateral ureteral stents, left lower than right, on the prior CT, proximal pigtail visible within the proximal left ureter. Electronically Signed   By:  Donavan Foil M.D.   On: 09/28/2020 20:24    Anti-infectives: Anti-infectives (From admission, onward)    Start     Dose/Rate Route Frequency Ordered Stop   10/03/20 1000  ceFEPIme (MAXIPIME) 2 g in sodium chloride 0.9 % 100 mL IVPB        2 g 200 mL/hr over 30 Minutes Intravenous Every 24 hours 10/03/20 0831     10/01/20 0800  cefTRIAXone (ROCEPHIN) 2 g in sodium chloride 0.9 % 100 mL IVPB  Status:  Discontinued        2 g 200 mL/hr over 30 Minutes Intravenous Every 24 hours 10/01/20 0700 10/03/20 0754   09/30/20 1830  vancomycin (VANCOREADY) IVPB 500 mg/100 mL        500 mg 100 mL/hr over 60 Minutes Intravenous Every 24 hours 09/26/2020 2245     09/23/2020 2345  ceFEPIme (MAXIPIME) 2 g in sodium chloride 0.9 % 100 mL IVPB  Status:  Discontinued        2 g 200 mL/hr over 30 Minutes Intravenous Every 12 hours 09/22/2020 2250 10/01/20 0653   10/14/2020 1731  vancomycin (VANCOCIN) 1-5 GM/200ML-% IVPB       Note to Pharmacy: Cameron Sprang   : cabinet override      10/12/2020 1731 09/30/20 0544       Assessment/Plan: POD 2 removal infected aortic graft, extraanatomic bypass, duodenal serosal tear -care per vascular surgery and ccm -drain near duodenum without bile -continue ng tube -would avoid cortrak placement and hold on trickle feeds -agree with starting TPN    Rolm Bookbinder 10/04/2020

## 2020-10-04 NOTE — Progress Notes (Signed)
Attempted SBT, PT failed due to apnea will attempt again as pt wakes up more.

## 2020-10-04 NOTE — Progress Notes (Signed)
PHARMACY - TOTAL PARENTERAL NUTRITION CONSULT NOTE   Indication:  intolerance to enteral feeding  Patient Measurements: Height: 4' 11.02" (149.9 cm) Weight: 50.2 kg (110 lb 10.7 oz) IBW/kg (Calculated) : 43.24 TPN AdjBW (KG): 45.6 Body mass index is 22.34 kg/m. Usual Weight:   Assessment:  Patient present 8/9 with L femoral stent bleeding/infection despite Omadacycline PTA. 8/9 Status postrepair of left femoral anastomosis with bovine pericardial patch for bleeding. There was small serosal injury to duodenum s/p procedure 8/12 patched with omentum. High risk for ileus. No tube feeds or oral intake given duodenal injury.  No Cortak should be placed per vascular note 8/14 due to high risk of perforation in area of repair.    PMH: carotid artery occlusion, CVA 2001, hyper otitis, HTN, HLD, anxiety, hypothyroidism, IBS, hydronephrosis, COPD, MI, PVD, PAF, depression, hepatitis, aortobifemoral bypass graft 10 years ago, rheumatic fever,  Glucose / Insulin: - Goal BG 140-180 while admitted to the ICU No h/o DM. 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 CBGs 95-184 last 24hrs. SSI 7 units SSI last 24h.  Electrolytes: K 3.6. CoCa 8.46. Phos 4.7, Mg 2.5 both elevated. Ca/Phos product <55.  Renal: Right ureteroureterostomy 8/12 with ureteral stent placement/exchange for complete mid ureteral transection. - Scr 1.88>>2.54. UOP marginal last 24h. - Lactic acid 4.1 elevated 8/13 (bicarb drip d/c'd 8/14)   Hepatic: LFT's elevated 8/13. AST 533, ALT 553, Tbili WNL - 8/13 TG 94.  Intake / Output; MIVF: No MIVF - Duodenal drain: 471m - NG tube 200 - LBM: PTA  GI Imaging: GI Surgeries / Procedures:  - 8/9 Status postrepair of left femoral anastomosis with bovine pericardial patch for bleeding - 8/12:  REMOVAL OF AORTA BI-FEMORAL BYPASS GRAFT, placed RIGTH AXILLARY BI-FEMORAL BYPASS GRAFT and LEFT AXILLARY POPLITEAL BYPASS GRAFT. There was small serosal injury to duodenum s/p procedure.  Central access:  TPN start  date:   Nutritional Goals (per RD recommendation on ): kCal:  , Protein:  , Fluid:   Goal TPN rate is   mL/hr (provides   g of protein and   kcals per day)  Current Nutrition:  NPO TPN  Plan:  Start TPN at 40 mL/hr at 1800 Electrolytes in TPN: Na 576m/L, K 2024mL, Ca 5mE44m, Mg 0mEq63m and Phos 0 mmol/L. Cl:Ac 1:1 Add standard MVI and trace elements to TPN Already on SSI q 4hrs. F/u need to add insulin to TPN Monitor TPN labs on Mon/Thurs, and PRN Transfuse for Hgb 6.6 Await RD assessment for goals.   [name] S. RoberAlford HighlandrmD, BCPS Clinical Staff Pharmacist [email or web address] RoberAlford Highlandstal [place] 10/04/2020,9:54 AM

## 2020-10-04 NOTE — Progress Notes (Signed)
Extubated this afternoon. Doing well on 4L [place]. Weak, but nodding to answer questions. No stridor.  Julian Hy, DO 10/04/20 5:23 PM [facility]

## 2020-10-04 NOTE — Progress Notes (Signed)
Urology Inpatient Progress Report  Other mechanical complication of femoral arterial graft (bypass), initial encounter (HCC) [T82.392A] Infected prosthetic vascular graft (HCC) [T82.7XXA]  Procedure(s): REMOVAL OF INFECTED AORTA BI-FEMORAL BYPASS GRAFT RIGTH AXILLARY TO  RIGHT FEMORAL BYPASS GRAFT LEFT AXILLARY TO LEFT FEMORAL BYPASS GRAFT SMALL BOWEL REPAIR PATCH ANGIOPLASTY EXPLORATORY LAPAROTOMY INSERTION OF 86F x 26cm CONTOUR STENT IN RIGHT URETER AND RIGHT URETEROURETEROSTOMY  2 Days Post-Op   Intv/Subj: Patient remains intubated and sedated.  Has had low urine output.  Creatinine this morning continues to uptrend to 2.54 from 1.72 from 1.46.  JP creatinine yesterday was equal to serum creatinine.  At 1.8.  Has been receiving blood products for resuscitation.  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), initial encounter (HCC)  Current Facility-Administered Medications  Medication Dose Route Frequency Provider Last Rate Last Admin   0.9 %  sodium chloride infusion (Manually program via Guardrails IV Fluids)   Intravenous Once Henry Russel, MD       0.9 %  sodium chloride infusion (Manually program via Guardrails IV Fluids)   Intravenous Once Karie Fetch P, DO       0.9 %  sodium chloride infusion   Intravenous PRN Sherren Kerns, MD 10 mL/hr at 10/04/20 0600 Infusion Verify at 10/04/20 0600   acetaminophen (TYLENOL) tablet 325-650 mg  325-650 mg Oral Q4H PRN Milinda Antis, PA-C       Or   acetaminophen (TYLENOL) suppository 325-650 mg  325-650 mg Rectal Q4H PRN Milinda Antis, PA-C       albuterol (PROVENTIL) (2.5 MG/3ML) 0.083% nebulizer solution 2.5 mg  2.5 mg Nebulization Q4H  PRN Karie Fetch P, DO   2.5 mg at 10/03/20 0758   arformoterol (BROVANA) nebulizer solution 15 mcg  15 mcg Nebulization BID Lidia Collum, PA-C   15 mcg at 10/04/20 0801   bisacodyl (DULCOLAX) suppository 10 mg  10 mg Rectal Daily PRN Milinda Antis, PA-C       budesonide (PULMICORT) nebulizer solution 0.25 mg  0.25 mg Nebulization BID Pia Mau D, PA-C   0.25 mg at 10/04/20 0801   calcium carbonate (OS-CAL - dosed in mg of elemental calcium) tablet 500 mg of elemental calcium  1 tablet Oral Daily Milinda Antis, PA-C   500 mg of elemental calcium at 10/01/20 0942   ceFEPIme (MAXIPIME) 2 g in sodium chloride 0.9 % 100 mL IVPB  2 g Intravenous Q24H Sherren Kerns, MD   Stopped at 10/03/20 1120   chlorhexidine gluconate (MEDLINE KIT) (PERIDEX) 0.12 % solution 15 mL  15 mL Mouth Rinse BID Sherren Kerns, MD   15 mL at 10/04/20 0856   Chlorhexidine Gluconate Cloth 2 % PADS 6 each  6 each Topical Daily Setzer, Lynnell Jude, PA-C       Chlorhexidine Gluconate Cloth 2 % PADS 6 each  6 each Topical Daily Milinda Antis, PA-C   6 each at 10/03/20 2232   diphenhydrAMINE (BENADRYL) injection 25 mg  25 mg Intravenous Q6H PRN Milinda Antis, PA-C   25 mg at 10/01/20 1738   fentaNYL (SUBLIMAZE) bolus via infusion 25-100 mcg  25-100 mcg Intravenous Q15 min PRN Lidia Collum, PA-C  25 mcg at 10/04/20 0108   fentaNYL 2525mcg in NS 223mL (70mcg/ml) infusion-PREMIX  25-200 mcg/hr Intravenous Continuous Mick Sell, PA-C 12.5 mL/hr at 10/04/20 0600 125 mcg/hr at 10/04/20 0600   guaiFENesin-dextromethorphan (ROBITUSSIN DM) 100-10 MG/5ML syrup 15 mL  15 mL Oral Q4H PRN Barbie Banner, PA-C       heparin injection 5,000 Units  5,000 Units Subcutaneous Q8H Serafina Mitchell, MD       hydrALAZINE (APRESOLINE) injection 5 mg  5 mg Intravenous Q20 Min PRN Setzer, Edman Circle, PA-C       HYDROmorphone (DILAUDID) injection 1 mg  1 mg Intravenous Q2H PRN Barbie Banner, PA-C       insulin aspart (novoLOG)  injection 0-9 Units  0-9 Units Subcutaneous Q4H Andres Labrum D, PA-C   1 Units at 10/03/20 2045   levothyroxine (SYNTHROID, LEVOTHROID) injection 50 mcg  50 mcg Intravenous Daily Renee Pain, MD   50 mcg at 10/03/20 1051   magnesium sulfate IVPB 2 g 50 mL  2 g Intravenous Daily PRN Barbie Banner, PA-C       MEDLINE mouth rinse  15 mL Mouth Rinse 10 times per day Elam Dutch, MD   15 mL at 10/04/20 0611   pantoprazole (PROTONIX) injection 40 mg  40 mg Intravenous QHS Renee Pain, MD   40 mg at 10/03/20 2232   phenol (CHLORASEPTIC) mouth spray 1 spray  1 spray Mouth/Throat PRN Barbie Banner, PA-C       phenylephrine CONCENTRATED 100mg  in sodium chloride 0.9% 274mL (0.4mg /mL) premix infusion  0-400 mcg/min Intravenous Titrated Elam Dutch, MD 19.5 mL/hr at 10/04/20 0600 130 mcg/min at 10/04/20 0600   polyethylene glycol (MIRALAX / GLYCOLAX) packet 17 g  17 g Oral Daily PRN Barbie Banner, PA-C       potassium chloride SA (KLOR-CON) CR tablet 20-40 mEq  20-40 mEq Oral Daily PRN Setzer, Edman Circle, PA-C       propofol (DIPRIVAN) 1000 MG/100ML infusion  0-50 mcg/kg/min Intravenous Continuous Andres Labrum D, PA-C 4.52 mL/hr at 10/04/20 0600 15 mcg/kg/min at 10/04/20 0600   sodium chloride flush (NS) 0.9 % injection 10-40 mL  10-40 mL Intracatheter Q12H Barbie Banner, PA-C   10 mL at 10/03/20 2232   sodium chloride flush (NS) 0.9 % injection 10-40 mL  10-40 mL Intracatheter PRN Setzer, Edman Circle, PA-C       vancomycin (VANCOREADY) IVPB 500 mg/100 mL  500 mg Intravenous Q24H Barbie Banner, PA-C   Stopped at 10/03/20 1906     Objective: Vital: Vitals:   10/04/20 0801 10/04/20 0822 10/04/20 0830 10/04/20 0850  BP:   139/75   Pulse:   83 73  Resp:   (!) 28 (!) 28  Temp:   99 F (37.2 C) 98.6 F (37 C)  TempSrc:    Esophageal  SpO2: 96% 94% 94% 97%  Weight:      Height:       I/Os: I/O last 3 completed shifts: In: 8080.7 [P.O.:11; I.V.:6277; Blood:846; Other:50; IV  Piggyback:896.8] Out: 18 [Urine:178; Emesis/NG output:400; Drains:845]  Physical Exam:  General: Intubated and sedated Lungs: on ventilator GI: Abdomen soft. JP drain with serosanguinous drainage Foley: Draining light red urine. Ext: lower extremities symmetric  Lab Results: Recent Labs    10/03/20 0608 10/03/20 0615 10/03/20 0952 10/03/20 1047 10/04/20 0340  WBC 17.2*  --   --  15.3* 18.2*  HGB 9.0*   < > 7.8* 9.4*  6.6*  HCT 28.2*   < > 23.0* 28.6* 19.6*   < > = values in this interval not displayed.    Recent Labs    10/03/20 0608 10/03/20 0615 10/03/20 0952 10/03/20 1047 10/04/20 0340  NA 136   < > 142 139 140  K 5.6*   < > 5.1 4.5 3.6  CL 110  --   --  109 102  CO2 17*  --   --  21* 26  GLUCOSE 165*  --   --  162* 115*  BUN 18  --   --  20 24*  CREATININE 1.72*  --   --  1.88* 2.54*  CALCIUM 7.0*  --   --  6.8* 7.1*   < > = values in this interval not displayed.    Recent Labs    10/11/2020 1908 10/03/20 1047  INR 1.5* 2.1*    No results for input(s): LABURIN in the last 72 hours. Results for orders placed or performed during the hospital encounter of 10/19/2020  SARS CORONAVIRUS 2 (TAT 6-24 HRS) Nasopharyngeal Nasopharyngeal Swab     Status: None   Collection Time: 09/28/2020  1:27 PM   Specimen: Nasopharyngeal Swab  Result Value Ref Range Status   SARS Coronavirus 2 NEGATIVE NEGATIVE Final    Comment: (NOTE) SARS-CoV-2 target nucleic acids are NOT DETECTED.  The SARS-CoV-2 RNA is generally detectable in upper and lower respiratory specimens during the acute phase of infection. Negative results do not preclude SARS-CoV-2 infection, do not rule out co-infections with other pathogens, and should not be used as the sole basis for treatment or other patient management decisions. Negative results must be combined with clinical observations, patient history, and epidemiological information. The expected result is Negative.  Fact Sheet for  Patients: SugarRoll.be  Fact Sheet for Healthcare Providers: https://www.woods-mathews.com/  This test is not yet approved or cleared by the [place] FDA and  has been authorized for detection and/or diagnosis of SARS-CoV-2 by FDA under an Emergency Use Authorization (EUA). This EUA will remain  in effect (meaning this test can be used) for the duration of the COVID-19 declaration under Se ction 564(b)(1) of the Act, 21 U.S.C. section 360bbb-3(b)(1), unless the authorization is terminated or revoked sooner.  Performed at [facility], [place] [address], [place], [place] [address]   Aerobic/Anaerobic Culture w Gram Stain (surgical/deep wound)     Status: None (Preliminary result)   Collection Time: 10/19/2020  6:45 PM   Specimen: Groin, Left; Wound  Result Value Ref Range Status   Specimen Description WOUND LEFT GROIN  Final   Special Requests PT ON VANC  Final   Gram Stain NO WBC SEEN NO ORGANISMS SEEN   Final   Culture   Final    NO GROWTH 4 DAYS NO ANAEROBES ISOLATED; CULTURE IN PROGRESS FOR 5 DAYS Performed at [facility], [address], [place], [place] [address]    Report Status PENDING  Incomplete  MRSA Next Gen by PCR, Nasal     Status: None   Collection Time: 10/13/2020 10:38 PM   Specimen: Nasal Mucosa; Nasal Swab  Result Value Ref Range Status   MRSA by PCR Next Gen NOT DETECTED NOT DETECTED Final    Comment: (NOTE) The GeneXpert MRSA Assay (FDA approved for NASAL specimens only), is one component of a comprehensive MRSA colonization surveillance program. It is not intended to diagnose MRSA infection nor to guide or monitor treatment for MRSA infections. Test  performance is not FDA approved in patients less than 40 years old. Performed at [facility], [place] [address] [place], [place] [address]   Culture, blood (routine x 2)     Status: None (Preliminary result)   Collection Time: 09/30/20 10:37 AM    Specimen: BLOOD LEFT HAND  Result Value Ref Range Status   Specimen Description BLOOD LEFT HAND  Final   Special Requests   Final    BOTTLES DRAWN AEROBIC AND ANAEROBIC Blood Culture results may not be optimal due to an inadequate volume of blood received in culture bottles   Culture   Final    NO GROWTH 4 DAYS Performed at [facility], [place] [address], [place], [place] [address]    Report Status PENDING  Incomplete  Culture, blood (routine x 2)     Status: None (Preliminary result)   Collection Time: 09/30/20 10:43 AM   Specimen: BLOOD LEFT ARM  Result Value Ref Range Status   Specimen Description BLOOD LEFT ARM  Final   Special Requests   Final    BOTTLES DRAWN AEROBIC AND ANAEROBIC Blood Culture adequate volume   Culture   Final    NO GROWTH 4 DAYS Performed at [facility], [place] [address], [place], [place] [address]    Report Status PENDING  Incomplete  Aerobic/Anaerobic Culture w Gram Stain (surgical/deep wound)     Status: None (Preliminary result)   Collection Time: 10/11/2020  8:50 AM   Specimen: PATH Soft tissue  Result Value Ref Range Status   Specimen Description WOUND  Final   Special Requests RIGHT GROIN WOUND SPEC A  Final   Culture   Final    NO GROWTH 1 DAY Performed at [facility], [address], [place], [place] [address]    Report Status PENDING  Incomplete  Gram stain     Status: None   Collection Time: 09/21/2020  8:50 AM   Specimen: Wound  Result Value Ref Range Status   Specimen Description WOUND  Final   Special Requests NONE  Final   Gram Stain   Final    RARE WBC PRESENT,BOTH PMN AND MONONUCLEAR NO ORGANISMS SEEN RESULT CALLED TO, READ BACK BY AND VERIFIED WITH: RN M.GOIN AT 8115 ON 10/07/2020 BY T.SAAD. Performed at [facility], [place] [address] [place], [place] [address]    Report Status 10/04/2020 FINAL  Final  Aerobic/Anaerobic Culture w Gram Stain (surgical/deep wound)     Status: None (Preliminary result)    Collection Time: 09/23/2020  2:25 PM   Specimen: PATH Other  Result Value Ref Range Status   Specimen Description WOUND  Final   Special Requests AROTIC GRAFT  Final   Gram Stain   Final    RARE WBC PRESENT,BOTH PMN AND MONONUCLEAR NO ORGANISMS SEEN    Culture   Final    NO GROWTH < 24 HOURS Performed at [facility], [place] [address] [place], [place] [address]    Report Status PENDING  Incomplete  Aerobic/Anaerobic Culture w Gram Stain (surgical/deep wound)     Status: None (Preliminary result)   Collection Time: 09/29/2020  2:56 PM   Specimen: PATH Other  Result Value Ref Range Status   Specimen Description WOUND  Final   Special Requests RIGHT LIMB OF AORTIC GRAFT  Final   Gram Stain   Final    FEW WBC PRESENT,BOTH PMN AND MONONUCLEAR NO ORGANISMS SEEN    Culture  Final    NO GROWTH < 24 HOURS Performed at [facility], [place] [address] [place], [place] [address]    Report Status PENDING  Incomplete  Aerobic/Anaerobic Culture w Gram Stain (surgical/deep wound)     Status: None (Preliminary result)   Collection Time: 10/07/2020  3:12 PM   Specimen: PATH Other  Result Value Ref Range Status   Specimen Description WOUND  Final   Special Requests LEFT LIMB AORTIC GRAFT FOR CULTURES SPEC D  Final   Gram Stain   Final    FEW WBC PRESENT, PREDOMINANTLY PMN NO ORGANISMS SEEN Performed at [facility], [address], [place], [place] [address]    Culture PENDING  Incomplete   Report Status PENDING  Incomplete    Studies/Results: DG Retrograde Pyelogram  Result Date: 10/03/2020 CLINICAL DATA:  Ureteral stent placement EXAM: ABDOMINAL FLUOROSCOPY COMPARISON:  CT 09/23/2020 FINDINGS: 2 fluoroscopic spot images document placement of bilateral double-j ureteral stents. IMPRESSION: Bilateral ureteral stent placement Electronically Signed   By: Lucrezia Europe M.D.   On: 10/03/2020 11:12   DG CHEST PORT 1 VIEW  Result Date: 10/04/2020 CLINICAL DATA:  75 year old female  with a history of intubated EXAM: PORTABLE CHEST 1 VIEW COMPARISON:  10/03/2020 FINDINGS: Cardiomediastinal silhouette unchanged in size and contour. Right IJ sheath unchanged. Endotracheal tube sutured Lea position above the carina, terminating 4.2 cm above carina. Gastric tube projects over the mediastinum terminating out of the field of view. Surgical staples projecting over the bilateral upper chest as well as the upper midline abdomen. Improving aeration at the right lung base, with decreased AC opacity. No pneumothorax. Fine reticulations throughout the lungs, similar to the comparison with no significant interlobular septal thickening. IMPRESSION: Improving aeration of the lungs compared to the prior, with no visualized pneumothorax. Unchanged endotracheal tube, gastric tube, right IJ sheath. Surgical changes of the bilateral upper chest and the midline abdomen. Electronically Signed   By: Corrie Mckusick D.O.   On: 10/04/2020 09:06   Portable Chest xray  Result Date: 10/03/2020 CLINICAL DATA:  Reason for exam: acute respiratory failure with hypoxia Hx of carotid artery occlusion, MI, stroke EXAM: PORTABLE CHEST - 1 VIEW COMPARISON:  the previous day's study FINDINGS: Endotracheal tube, nasogastric tube, and right IJ central venous catheter remain in place. Lungs are clear. Small right pleural effusion. Heart size normal.  Aortic Atherosclerosis (ICD10-170.0). No pneumothorax. Skin staples project over the axillary regions. Missing dentition. Cholecystectomy clips. Right ureteral stent proximal aspect is noted. IMPRESSION: 1. Support hardware stable in position. 2. small right pleural effusion. Electronically Signed   By: Lucrezia Europe M.D.   On: 10/03/2020 11:14   DG Chest Port 1 View  Result Date: 09/26/2020 CLINICAL DATA:  Post surgery EXAM: PORTABLE CHEST 1 VIEW COMPARISON:  10/20/2020, 10/12/2015 FINDINGS: Interval intubation, tip of the endotracheal tube is about 3 cm superior to the carina.  Esophageal tube tip below the diaphragm but incompletely visualized. Right IJ catheter sheath over the SVC confluence. New airspace disease the right base. Normal cardiac size. Aortic atherosclerosis. No definitive pneumothorax. Gas within the subcutaneous soft tissues over the axillary regions. IMPRESSION: 1. Placement of support lines and tubes as above. 2. New soft tissue gas over the axillary regions bilaterally 3. New airspace disease at the right base which could be due to atelectasis or possibly aspiration Electronically Signed   By: Donavan Foil M.D.   On: 10/01/2020 20:20   DG Abd Portable 1V  Result Date:  09/28/2020 CLINICAL DATA:  OG tube placement EXAM: PORTABLE ABDOMEN - 1 VIEW COMPARISON:  None. FINDINGS: Esophageal tube tip and side port overlie the stomach. Nonobstructed gas pattern with moderate stool. Drain over the pelvis. Bilateral ureteral stents left lower than right but stable in configuration since CT from 8 9. The left ureteral stent pigtail appears positioned within the proximal left ureter on CT. IMPRESSION: 1. Esophageal tube tip and side port overlie the stomach. 2. Nonobstructed gas pattern 3. Bilateral ureteral stents, left lower than right, on the prior CT, proximal pigtail visible within the proximal left ureter. Electronically Signed   By: Donavan Foil M.D.   On: 10/21/2020 20:24    Assessment: Right ureteral injury status post right ureteroureterostomy  Procedure(s): REMOVAL OF INFECTED AORTA BI-FEMORAL BYPASS GRAFT RIGTH AXILLARY TO  RIGHT FEMORAL BYPASS GRAFT LEFT AXILLARY TO LEFT FEMORAL BYPASS GRAFT SMALL BOWEL REPAIR PATCH ANGIOPLASTY EXPLORATORY LAPAROTOMY INSERTION OF 36F x 26cm CONTOUR STENT IN RIGHT URETER AND RIGHT URETEROURETEROSTOMY, 2 Days Post-Op  doing well.  Plan:  Continue Foley catheter.  I irrigated the bladder with about 60  cc gently.  This irrigated easily.  Did not appear to have any clots in the bladder.  Continue JP x2.    10/04/2020,  9:20 AM

## 2020-10-04 NOTE — Progress Notes (Signed)
NAME:  [name], MRN:  [id number], DOB:  [birth year], LOS: 5 ADMISSION DATE:  09/25/2020, CONSULTATION DATE:  8/12 REFERRING MD:  Dr. Oneida Alar, CHIEF COMPLAINT:  removal of infected aorta bi-femoral bypass graft; placement of R axillay BI-FEMORAL BYPASS GRAFT and LEFT AXILLARY POPLITEAL BYPASS GRAFT   History of Present Illness:  Patient is a 75 yo F w/ pertinent PMH of carotid artery occlusion, CVA, hyper otitis, HTN, HLD, hypothyroidism, IBS, hydronephrosis, COPD, MI, PVD, PAF, aortobifemoral bypass graft 10 years ago presents to [facility] on 09/28/2020 with left femoral stent bleeding and infection.  Patient has been on Omadacycline for 2 days prior to admission for infection and has recently noticed profuse bleeding from the site. Has had I&D of left groin in December 2021. Bleeding was stopped and EMS called to bring to [facility] on 8/9. Last dose of Eliquis for PAF on 8/8. Hgb and bp stable. Vascular surgery consulted. On 8/9 Status postrepair of left femoral anastomosis with bovine pericardial patch for bleeding surgery performed. 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 Started on Vanc/cefepime. Infectious disease consulted 8/10. On Vanc and ceftriaxone.  On 8/12, Vascular surgery  performed: REMOVAL OF AORTA BI-FEMORAL BYPASS GRAFT, placed RIGTH AXILLARY BI-FEMORAL BYPASS GRAFT and LEFT AXILLARY POPLITEAL BYPASS GRAFT. There was small serosal injury to duodenum s/p procedure. General Surgery consulted. Patched with omentum and left NG tube with drain. Urology placed Right ureteroureterostomy with ureteral stent placement/exchange for complete mid ureteral transection. Patient arrived to ICU intubated.  PCCM consulted for management of mechanical ventilation and medical management.  Pertinent  Medical History   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   Cerebrovascular occlusion 2001   Extracranial with Hx of TIA's   Depression    Hepatitis     Hyperlipidemia    IBS (irritable bowel syndrome)    Myocardial infarction [facility])    Peripheral vascular disease (HCC)    had Carotid En   Rheumatic fever    Stroke ([place])    no residual- except left side of face has a small "twist"   Thyroid disease    Hypothyroidism     Significant Hospital Events: Including procedures, antibiotic start and stop dates in addition to other pertinent events   8/12: pccm consulted for management of mechanical ventilation s/p surgery placement of RIGHT axillay BI-FEMORAL BYPASS GRAFT and LEFT AXILLARY POPLITEAL BYPASS GRAFT   Interim History / Subjective:  UOP remains low, 1 unit pRBCs this morning for 3g drop in Hb.   Objective   Blood pressure (!) 141/65, pulse 71, temperature (!) 97 F (36.1 C), resp. rate (!) 28, height 4' 11.02" (1.499 m), weight 50.2 kg, SpO2 100 %. CVP:  [7 mmHg] 7 mmHg  Vent Mode: PRVC FiO2 (%):  [40 %-80 %] 40 % Set Rate:  [28 bmp] 28 bmp Vt Set:  [450 mL] 450 mL PEEP:  [5 cmH20] 5 cmH20 Plateau Pressure:  [17 cmH20-20 cmH20] 17 cmH20   Intake/Output Summary (Last 24 hours) at 10/04/2020 0716 Last data filed at 10/04/2020 0600 Gross per 24 hour  Intake 4454.02 ml  Output 728 ml  Net 3726.02 ml    Filed Weights   09/25/2020 2230 09/30/20 0600 10/01/20 0500  Weight: 45.6 kg 48.2 kg 50.2 kg   UOP 83cc  Examination: General:   critically ill appearing woman lying in bed in NAD HEENT: [place]/AT, eyes anicteric, endotracheal tube present Neuro: RASS -4, not following  commands, intact cough reflex, breathing above the vent CV: S1-S2, regular rate and rhythm PULM: Synchronous with the vent, CTA B, minimal endotracheal secretions, but thick GI: Soft, mildly tender to palpation.  Bloody output from both drains with mild blood on the dressing extending from markings yesterday Skin: Warm, dry Extremities: Normal perfusion distal to right radial A-line, hand edema bilaterally GU: Foley with minimal bloody urine output  WBC  18.2 H/H 6.6/19.6 Platelets 140 K+ 3.6 BUN 24 Cr 2.54 CXR personally reviewed> improved RLL opacity,  Wound cultures pending Blood cultures NGTD   Resolved Hospital Problem list     Assessment & Plan:  Aortobifemoral bypass graft infection, unknown organism Removal of infected aorta bi-fem bypass graft Right axillary bi-fem bypass graft Axillary popliteal bypass graft 500 cc blood loss; resuscitated with cell saver and 4 RBCs intraoperatively, 2 units RBCs and 2 FFP on postop day 1 P: -Continue holding Eliquis and DVT prophylaxis; hopefully can resume soon -Continue broad-spectrum antibiotics-cefepime and Vanco. -Transfusing additional RBCs today (2 units total) before rechecking CBC -maintain MAP >65 and SBP >90; wean off Neo-Synephrine as able -ID following for graft infection; follow cultures until finalized  Small serosal injury to duodenum s/p repair: Patched with omentum and left NG tube with drain.  High risk for ileus given extensive vascular surgery -Appreciate general surgery's management -Per surgery, would wait several days before initiating tube feeds given extent of surgery and need for laceration repair of duodenum. -No Cortrak without talking to general surgery> high risk for perforation given area of repair -NPO, NGT to LIWS  Right ureteroureterostomy 8/12 with ureteral stent placement/exchange for complete mid ureteral transection.  Creatinine and fluid from drain is similar to serum level, no leak. P: -Appreciate Urology's management  -Continue Foley catheter for minimum 10 days -keep ureteral stent at least 6 weeks -Strict I's/O  Acute respiratory failure with hypoxia s/p surgical procedure requiring mechanical ventilation Concern for RLL HAP P: -Continue trach changing down FiO2-down to 30% - SAT and SBT today.  Hopeful for extubation later today now that she is more hemodynamically stable - Low tidal volume ventilation, 4 to 8 cc/kg ideal body weight  goal plateau less than 30 and driving pressure less than 15 - Pad protocol for sedation. -VAP prevention protocol - Collect trach aspirate if able - Continue broad gram-negative coverage and vancomycin -DuoNebs as needed, continue Brovana and Pulmicort twice daily  Shock, concern for hypovolemic due to bleeding and third spacing. Sedation likely contributing.  Less variability and she has been more volume resuscitated. -2 units PRBCs today -wean pressors to maintain MAP >65 and SBP >90  AKI with oliguria; suspect ATN from prerenal state; appears volume resuscitated now with peripheral edema Hyperkalemia-resolved Metabolic acidosis-improved -Renally dose meds, avoid nephrotoxic meds - Strict I's/O - Stop bicarb infusion  Hyperglycemia, prediabetes-controlled with minimal insulin P: - SSI as needed - Goal BG 140-180 while admitted to the ICU  Hypomagnesemia resolved  Thrombocytopenia Acute on chronic anemia, acute blood loss anemia P: -Additional 1 unit PRBCs this morning, total 2.  Recheck CBC after -transfuse for hgb <7 or hemodynamically significant bleeding  Hypothermia P: -continue warming  COPD without acute exacerbation P: -Continue pulmicort & brovana while intubated; holding PTA symbicort  -prn duo-nebs  hypothyroidism -Continue IV Synthroid until able to take p.o.  H/o HTN Paroxysmal Afib -hold home beta blocker while on pressors; can use amiodarone if HR control is required -Continue to hold home Eliquis  HLD -hold statin while NPO -  monitor LFTs  We will update family later today.   Best Practice (right click and "Reselect all SmartList Selections" daily)   Diet/type: NPO DVT prophylaxis: SCD GI prophylaxis: PPI Lines: Central line and Arterial Line Foley:  Yes, and it is still needed Code Status:  full code Last date of multidisciplinary goals of care discussion [8/13 ]  Labs   CBC: Recent Labs  Lab 09/28/2020 1226 09/26/2020 1844  10/16/2020 1908 09/28/2020 1938 10/03/20 0232 10/03/20 0407 10/03/20 [id number] 10/03/20 0615 10/03/20 0952 10/03/20 1047 10/04/20 0340  WBC 8.5   < > 7.1  --  15.9*  --  17.2*  --   --  15.3* 18.2*  NEUTROABS 5.6  --   --   --   --   --   --   --   --   --   --   HGB 10.6*   < > 9.4*   < > 10.7*   < > 9.0* 8.2* 7.8* 9.4* 6.6*  HCT 34.6*   < > 29.9*   < > 32.8*   < > 28.2* 24.0* 23.0* 28.6* 19.6*  MCV 86.9   < > 90.6  --  90.4  --  91.9  --   --  88.5 88.7  PLT 243   < > 95*  --  202  --  175  --   --  155 140*   < > = values in this interval not displayed.     BMP Latest Ref Rng & Units 10/04/2020 10/03/2020 10/03/2020  Glucose 70 - 99 mg/dL 115(H) 162(H) -  BUN 8 - 23 mg/dL 24(H) 20 -  Creatinine 0.44 - 1.00 mg/dL 2.54(H) 1.88(H) -  BUN/Creat Ratio 12 - 28 - - -  Sodium 135 - 145 mmol/L 140 139 142  Potassium 3.5 - 5.1 mmol/L 3.6 4.5 5.1  Chloride 98 - 111 mmol/L 102 109 -  CO2 22 - 32 mmol/L 26 21(L) -  Calcium 8.9 - 10.3 mg/dL 7.1(L) 6.8(L) -    This patient is critically ill with multiple organ system failure which requires frequent high complexity decision making, assessment, support, evaluation, and titration of therapies. This was completed through the application of advanced monitoring technologies and extensive interpretation of multiple databases. During this encounter critical care time was devoted to patient care services described in this note for 50 minutes.   Julian Hy, DO 10/04/20 7:52 AM [facility]

## 2020-10-04 NOTE — Progress Notes (Signed)
Pharmacy Antibiotic Note  [name] is a 75 y.o. female admitted on 10/01/2020 with infected AFB graft. Pharmacy has been consulted for vancomycin and cefepime.   Vancomycin for methicillin resistant staph epi in AFB graft. On omatacycline PTA. -vancomycin level= 16 ~ 7pm today -last vancomycin dose was '500mg'$  given on 8/13 ~ 6pm. A dose was missed on 8/12 -SCr 1.88> 2.54 (BL ~ 0.8)  Cefepime for suspected PNA.    Plan: -Vancomycin '750mg'$  IV x1 -Will check another level in 24-36 hours -Continue Cefepime 2g q24    Height: 4' 11.02" (149.9 cm) Weight: 50.2 kg (110 lb 10.7 oz) IBW/kg (Calculated) : 43.24  Temp (24hrs), Avg:98.4 F (36.9 C), Min:97 F (36.1 C), Max:99.1 F (37.3 C)  Recent Labs  Lab 10/17/2020 1908 10/03/20 0232 10/03/20 0608 10/03/20 1047 10/04/20 0340 10/04/20 1203 10/04/20 1919  WBC 7.1 15.9* 17.2* 15.3* 18.2* 23.0*  --   CREATININE 1.02* 1.46* 1.72* 1.88* 2.54*  --   --   LATICACIDVEN  --   --  4.9* 4.1*  --   --   --   VANCOTROUGH  --   --   --   --   --   --  16     Estimated Creatinine Clearance: 12.9 mL/min (A) (by C-G formula based on SCr of 2.54 mg/dL (H)).    Allergies  Allergen Reactions   Penicillins Swelling   Citalopram Other (See Comments)    Nightmares 10/03/2020 Pt states that she is currently taking this drug.   Morphine And Related Nausea And Vomiting   Losartan Nausea Only and Other (See Comments)    Nausea and dizzy. 10/15/2020 Pt states she does not recall having an intolerance to this drug.    Antimicrobials this admission: Zosyn 8/9 >>  Vancomycin 8/9 >>  Cefepime 8/9>> 8/13>> Ceftriaxone 8/11>> 8/13   Thank you for allowing pharmacy to be a part of this patient's care.  Hildred Laser, PharmD Clinical Pharmacist **Pharmacist phone directory can now be found on [email or web address] ([email or web address]).  Listed under [place].

## 2020-10-04 NOTE — Procedures (Signed)
Extubation Procedure Note  Patient Details:   Name: [name] DOB: Jan 06, 1946 MRN: [id number]   Airway Documentation:    Vent end date: 10/04/20 Vent end time: 1525   Evaluation  O2 sats: stable throughout Complications: No apparent complications Patient did tolerate procedure well. Bilateral Breath Sounds: Diminished   No  Gonzella Lex 10/04/2020, 3:33 PM

## 2020-10-04 NOTE — Progress Notes (Signed)
Subjective  - POD #2  Received 1 u pRBC overnight   Physical Exam:  Intubated sedated Palpable pedal pulses Abd soft, distended Opens eyes to voice       Assessment/Plan:  POD #2  Cardiovascular: The patient has required neoovernight for hypotension.  She has been responsive to fluids.  Currently we are weaning down her neo as she has received more volume.  Initial troponins were unremarkable. Pulmonary: Continue ventilator support.  Will wean as tolerated.  X-ray shows possible infiltrate and so she has been started on antibiotics empirically.  Appreciate CCM assistance GI: NG tube remains in place as does right upper quadrant drain.  No tube feeds or oral intake given duodenal injury.  No Corpak should be placed.  Her NG tube should not be replaced if it is accidentally removed.  Appreciate GI surgery assistance. Nutrition:  will start TNA today Renal: Renal output remains marginal.  her creatinine continues to increase.  May need renal consult.  Would not be surprised if she has further decline in her renal function given her suprarenal clamp time as well as her hypotension.  We will closely monitor this. Acute blood loss anemia: No active bleeding however she got 1 unit overnight and 1 more this am. Urology: Ureteral injury repaired in the operating room by urology.  JP remains in place.  Appreciate urology assistance. Prophylaxis: On Protonix.  We will start subcu heparin.  SCDs.  [name] 10/04/2020 7:52 AM --  Vitals:   10/04/20 0630 10/04/20 0645  BP: (!) 141/65   Pulse: 69 71  Resp: (!) 28 (!) 28  Temp: (!) 97.5 F (36.4 C) (!) 97 F (36.1 C)  SpO2: 99% 100%    Intake/Output Summary (Last 24 hours) at 10/04/2020 0752 Last data filed at 10/04/2020 0600 Gross per 24 hour  Intake 4454.02 ml  Output 728 ml  Net 3726.02 ml     Laboratory CBC    Component Value Date/Time   WBC 18.2 (H) 10/04/2020 0340   HGB 6.6 (LL) 10/04/2020 0340   HCT 19.6 (L)  10/04/2020 0340   PLT 140 (L) 10/04/2020 0340    BMET    Component Value Date/Time   NA 140 10/04/2020 0340   NA 142 07/03/2019 1116   K 3.6 10/04/2020 0340   CL 102 10/04/2020 0340   CO2 26 10/04/2020 0340   GLUCOSE 115 (H) 10/04/2020 0340   BUN 24 (H) 10/04/2020 0340   BUN 21 07/03/2019 1116   CREATININE 2.54 (H) 10/04/2020 0340   CALCIUM 7.1 (L) 10/04/2020 0340   GFRNONAA 19 (L) 10/04/2020 0340   GFRAA 57 (L) 07/03/2019 1116    COAG Lab Results  Component Value Date   INR 2.1 (H) 10/03/2020   INR 1.5 (H) 10/10/2020   No results found for: PTT  Antibiotics Anti-infectives (From admission, onward)    Start     Dose/Rate Route Frequency Ordered Stop   10/03/20 1000  ceFEPIme (MAXIPIME) 2 g in sodium chloride 0.9 % 100 mL IVPB        2 g 200 mL/hr over 30 Minutes Intravenous Every 24 hours 10/03/20 0831     10/01/20 0800  cefTRIAXone (ROCEPHIN) 2 g in sodium chloride 0.9 % 100 mL IVPB  Status:  Discontinued        2 g 200 mL/hr over 30 Minutes Intravenous Every 24 hours 10/01/20 0700 10/03/20 0754   09/30/20 1830  vancomycin (VANCOREADY) IVPB 500 mg/100 mL  500 mg 100 mL/hr over 60 Minutes Intravenous Every 24 hours 10/21/2020 2245     10/05/2020 2345  ceFEPIme (MAXIPIME) 2 g in sodium chloride 0.9 % 100 mL IVPB  Status:  Discontinued        2 g 200 mL/hr over 30 Minutes Intravenous Every 12 hours 10/11/2020 2250 10/01/20 0653   10/19/2020 1731  vancomycin (VANCOCIN) 1-5 GM/200ML-% IVPB       Note to Pharmacy: Cameron Sprang   : cabinet override      10/14/2020 1731 09/30/20 0544        V. Leia Alf, M.D., [facility] [place] Office: [phone] Pager:  [phone]

## 2020-10-05 ENCOUNTER — Inpatient Hospital Stay (HOSPITAL_COMMUNITY): Payer: Medicare HMO

## 2020-10-05 ENCOUNTER — Encounter (HOSPITAL_COMMUNITY): Payer: Self-pay | Admitting: Vascular Surgery

## 2020-10-05 DIAGNOSIS — Z452 Encounter for adjustment and management of vascular access device: Secondary | ICD-10-CM | POA: Diagnosis not present

## 2020-10-05 DIAGNOSIS — N179 Acute kidney failure, unspecified: Secondary | ICD-10-CM

## 2020-10-05 DIAGNOSIS — D72828 Other elevated white blood cell count: Secondary | ICD-10-CM

## 2020-10-05 DIAGNOSIS — I428 Other cardiomyopathies: Secondary | ICD-10-CM

## 2020-10-05 DIAGNOSIS — R7401 Elevation of levels of liver transaminase levels: Secondary | ICD-10-CM

## 2020-10-05 DIAGNOSIS — J9601 Acute respiratory failure with hypoxia: Secondary | ICD-10-CM | POA: Diagnosis not present

## 2020-10-05 DIAGNOSIS — T827XXS Infection and inflammatory reaction due to other cardiac and vascular devices, implants and grafts, sequela: Secondary | ICD-10-CM

## 2020-10-05 LAB — RENAL FUNCTION PANEL
Albumin: 1.5 g/dL — ABNORMAL LOW (ref 3.5–5.0)
Anion gap: 12 (ref 5–15)
BUN: 45 mg/dL — ABNORMAL HIGH (ref 8–23)
CO2: 24 mmol/L (ref 22–32)
Calcium: 7 mg/dL — ABNORMAL LOW (ref 8.9–10.3)
Chloride: 103 mmol/L (ref 98–111)
Creatinine, Ser: 3.81 mg/dL — ABNORMAL HIGH (ref 0.44–1.00)
GFR, Estimated: 12 mL/min — ABNORMAL LOW (ref >60)
Glucose, Bld: 131 mg/dL — ABNORMAL HIGH (ref 70–99)
Phosphorus: 6 mg/dL — ABNORMAL HIGH (ref 2.5–4.6)
Potassium: 4.3 mmol/L (ref 3.5–5.1)
Sodium: 139 mmol/L (ref 135–145)

## 2020-10-05 LAB — CBC
HCT: 29.9 % — ABNORMAL LOW (ref 36.0–46.0)
HCT: 29.6 % — ABNORMAL LOW (ref 36.0–46.0)
HCT: 28.4 % — ABNORMAL LOW (ref 36.0–46.0)
Hemoglobin: 9.4 g/dL — ABNORMAL LOW (ref 12.0–15.0)
Hemoglobin: 9.5 g/dL — ABNORMAL LOW (ref 12.0–15.0)
Hemoglobin: 8.9 g/dL — ABNORMAL LOW (ref 12.0–15.0)
MCH: 28.5 pg (ref 26.0–34.0)
MCH: 29.1 pg (ref 26.0–34.0)
MCH: 29 pg (ref 26.0–34.0)
MCHC: 31.4 g/dL (ref 30.0–36.0)
MCHC: 32.1 g/dL (ref 30.0–36.0)
MCHC: 31.3 g/dL (ref 30.0–36.0)
MCV: 90.6 fL (ref 80.0–100.0)
MCV: 90.8 fL (ref 80.0–100.0)
MCV: 92.5 fL (ref 80.0–100.0)
Platelets: 95 10*3/uL — ABNORMAL LOW (ref 150–400)
Platelets: 120 10*3/uL — ABNORMAL LOW (ref 150–400)
Platelets: 113 10*3/uL — ABNORMAL LOW (ref 150–400)
RBC: 3.3 MIL/uL — ABNORMAL LOW (ref 3.87–5.11)
RBC: 3.26 MIL/uL — ABNORMAL LOW (ref 3.87–5.11)
RBC: 3.07 MIL/uL — ABNORMAL LOW (ref 3.87–5.11)
RDW: 15 % (ref 11.5–15.5)
RDW: 16.7 % — ABNORMAL HIGH (ref 11.5–15.5)
RDW: 17 % — ABNORMAL HIGH (ref 11.5–15.5)
WBC: 7.1 10*3/uL (ref 4.0–10.5)
WBC: 24.7 10*3/uL — ABNORMAL HIGH (ref 4.0–10.5)
WBC: 24.7 10*3/uL — ABNORMAL HIGH (ref 4.0–10.5)
nRBC: 0 % (ref 0.0–0.2)
nRBC: 0 % (ref 0.0–0.2)
nRBC: 0.1 % (ref 0.0–0.2)

## 2020-10-05 LAB — BPAM RBC
Blood Product Expiration Date: 202209142359
Blood Product Expiration Date: 202209142359
Blood Product Expiration Date: 202209142359
ISSUE DATE / TIME: 202208130711
ISSUE DATE / TIME: 202208140519
ISSUE DATE / TIME: 202208140813
Unit Type and Rh: 5100
Unit Type and Rh: 5100
Unit Type and Rh: 5100

## 2020-10-05 LAB — TYPE AND SCREEN
ABO/RH(D): O POS
Antibody Screen: NEGATIVE
Unit division: 0
Unit division: 0
Unit division: 0

## 2020-10-05 LAB — POCT I-STAT 7, (LYTES, BLD GAS, ICA,H+H)
Acid-Base Excess: 1 mmol/L (ref 0.0–2.0)
Bicarbonate: 27.7 mmol/L (ref 20.0–28.0)
Calcium, Ion: 0.91 mmol/L — ABNORMAL LOW (ref 1.15–1.40)
HCT: 25 % — ABNORMAL LOW (ref 36.0–46.0)
Hemoglobin: 8.5 g/dL — ABNORMAL LOW (ref 12.0–15.0)
O2 Saturation: 97 %
Potassium: 4.3 mmol/L (ref 3.5–5.1)
Sodium: 139 mmol/L (ref 135–145)
TCO2: 29 mmol/L (ref 22–32)
pCO2 arterial: 54.9 mmHg — ABNORMAL HIGH (ref 32.0–48.0)
pH, Arterial: 7.31 — ABNORMAL LOW (ref 7.350–7.450)
pO2, Arterial: 97 mmHg (ref 83.0–108.0)

## 2020-10-05 LAB — COMPREHENSIVE METABOLIC PANEL
ALT: 1117 U/L — ABNORMAL HIGH (ref 0–44)
AST: 854 U/L — ABNORMAL HIGH (ref 15–41)
Albumin: 1.7 g/dL — ABNORMAL LOW (ref 3.5–5.0)
Alkaline Phosphatase: 113 U/L (ref 38–126)
Anion gap: 9 (ref 5–15)
BUN: 39 mg/dL — ABNORMAL HIGH (ref 8–23)
CO2: 26 mmol/L (ref 22–32)
Calcium: 7 mg/dL — ABNORMAL LOW (ref 8.9–10.3)
Chloride: 103 mmol/L (ref 98–111)
Creatinine, Ser: 3.57 mg/dL — ABNORMAL HIGH (ref 0.44–1.00)
GFR, Estimated: 13 mL/min — ABNORMAL LOW (ref >60)
Glucose, Bld: 120 mg/dL — ABNORMAL HIGH (ref 70–99)
Potassium: 4.9 mmol/L (ref 3.5–5.1)
Sodium: 138 mmol/L (ref 135–145)
Total Bilirubin: 0.9 mg/dL (ref 0.3–1.2)
Total Protein: 4 g/dL — ABNORMAL LOW (ref 6.5–8.1)

## 2020-10-05 LAB — POCT I-STAT, CHEM 8
BUN: 16 mg/dL (ref 8–23)
Calcium, Ion: 1.09 mmol/L — ABNORMAL LOW (ref 1.15–1.40)
Chloride: 105 mmol/L (ref 98–111)
Creatinine, Ser: 0.8 mg/dL (ref 0.44–1.00)
Glucose, Bld: 161 mg/dL — ABNORMAL HIGH (ref 70–99)
HCT: 30 % — ABNORMAL LOW (ref 36.0–46.0)
Hemoglobin: 10.2 g/dL — ABNORMAL LOW (ref 12.0–15.0)
Potassium: 6.4 mmol/L (ref 3.5–5.1)
Sodium: 135 mmol/L (ref 135–145)
TCO2: 27 mmol/L (ref 22–32)

## 2020-10-05 LAB — ECHOCARDIOGRAM COMPLETE
AR max vel: 2.21 cm2
AV Area VTI: 2.26 cm2
AV Area mean vel: 2.39 cm2
AV Mean grad: 8 mmHg
AV Peak grad: 19.7 mmHg
Ao pk vel: 2.22 m/s
Area-P 1/2: 3.27 cm2
Height: 59.016 in
S' Lateral: 2.4 cm
Weight: 1770.73 oz

## 2020-10-05 LAB — GLUCOSE, CAPILLARY
Glucose-Capillary: 106 mg/dL — ABNORMAL HIGH (ref 70–99)
Glucose-Capillary: 109 mg/dL — ABNORMAL HIGH (ref 70–99)
Glucose-Capillary: 113 mg/dL — ABNORMAL HIGH (ref 70–99)
Glucose-Capillary: 114 mg/dL — ABNORMAL HIGH (ref 70–99)
Glucose-Capillary: 115 mg/dL — ABNORMAL HIGH (ref 70–99)
Glucose-Capillary: 117 mg/dL — ABNORMAL HIGH (ref 70–99)
Glucose-Capillary: 120 mg/dL — ABNORMAL HIGH (ref 70–99)
Glucose-Capillary: 127 mg/dL — ABNORMAL HIGH (ref 70–99)

## 2020-10-05 LAB — CULTURE, BLOOD (ROUTINE X 2)
Culture: NO GROWTH
Culture: NO GROWTH
Special Requests: ADEQUATE

## 2020-10-05 LAB — PROTIME-INR
INR: 1.4 — ABNORMAL HIGH (ref 0.8–1.2)
Prothrombin Time: 16.8 seconds — ABNORMAL HIGH (ref 11.4–15.2)

## 2020-10-05 LAB — MAGNESIUM: Magnesium: 2.4 mg/dL (ref 1.7–2.4)

## 2020-10-05 LAB — TRIGLYCERIDES: Triglycerides: 178 mg/dL — ABNORMAL HIGH (ref <150)

## 2020-10-05 LAB — PHOSPHORUS: Phosphorus: 6.5 mg/dL — ABNORMAL HIGH (ref 2.5–4.6)

## 2020-10-05 MED ORDER — SODIUM CHLORIDE 0.9 % IV SOLN
8.0000 mg/kg | INTRAVENOUS | Status: DC
  Administered 2020-10-06: 400 mg via INTRAVENOUS
  Filled 2020-10-05 (×2): qty 8

## 2020-10-05 MED ORDER — HEPARIN SODIUM (PORCINE) 1000 UNIT/ML IJ SOLN
3000.0000 [IU] | Freq: Once | INTRAMUSCULAR | Status: AC
  Administered 2020-10-05: 3000 [IU] via INTRAVENOUS
  Filled 2020-10-05: qty 3

## 2020-10-05 MED ORDER — METRONIDAZOLE 500 MG/100ML IV SOLN
500.0000 mg | Freq: Two times a day (BID) | INTRAVENOUS | Status: DC
  Administered 2020-10-05 – 2020-10-06 (×3): 500 mg via INTRAVENOUS
  Filled 2020-10-05 (×3): qty 100

## 2020-10-05 MED ORDER — ZINC CHLORIDE 1 MG/ML IV SOLN
INTRAVENOUS | Status: AC
  Filled 2020-10-05: qty 288

## 2020-10-05 NOTE — Progress Notes (Signed)
Pharmacy Antibiotic Note  [name] is a 75 y.o. female admitted on 10/14/2020 with a possible intra-abdomina infection and history of MRSE graft intection.  Pharmacy has been consulted for daptomycion dosing.  Plan: Daptomycin '8mg'$ /kg q48h ('400mg'$ )  - start tomorrow due to renal function and recent vancomycin administration Continue cefepime 2g iv q24h Add metronidazole '500mg'$  IV q12h  Height: 4' 11.02" (149.9 cm) Weight: 50.2 kg (110 lb 10.7 oz) IBW/kg (Calculated) : 43.24  Temp (24hrs), Avg:98.9 F (37.2 C), Min:97.9 F (36.6 C), Max:99.5 F (37.5 C)  Recent Labs  Lab 10/03/20 0232 10/03/20 0608 10/03/20 1047 10/04/20 0340 10/04/20 1203 10/04/20 1919 10/05/20 0348  WBC 15.9* 17.2* 15.3* 18.2* 23.0*  --  24.7*  CREATININE 1.46* 1.72* 1.88* 2.54*  --   --  3.57*  LATICACIDVEN  --  4.9* 4.1*  --   --   --   --   VANCOTROUGH  --   --   --   --   --  16  --     Estimated Creatinine Clearance: 9.1 mL/min (A) (by C-G formula based on SCr of 3.57 mg/dL (H)).    Allergies  Allergen Reactions   Penicillins Swelling   Citalopram Other (See Comments)    Nightmares 10/01/2020 Pt states that she is currently taking this drug.   Morphine And Related Nausea And Vomiting   Losartan Nausea Only and Other (See Comments)    Nausea and dizzy. 10/01/2020 Pt states she does not recall having an intolerance to this drug.    Antimicrobials this admission:cefepime 8/9 >> 8/10, 8/13>> Vancomycin  8/9 >> 8/15 Ceftriaxone 8/11>8/13 Metronidazole 8/15>>   Microbiology results: 8/9 left groin wound> no growth 8/10 BCx: No growth 8/12 wound: No growth  8/12 wound Left limb aortic graft:  No growth 8/12 wound right groin wound: no growth 8/12 aortic graft: no growth 8/14 Sputum: rare yeast so far  8/9 MRSA PCR: negative  Thank you for allowing pharmacy to be a part of this patient's care.  [name] 10/05/2020 9:48 AM

## 2020-10-05 NOTE — Progress Notes (Signed)
3 Days Post-Op   Subjective/Chief Complaint: Pt with some delerium per RN On pressors   Objective: Vital signs in last 24 hours: Temp:  [97.9 F (36.6 C)-99.1 F (37.3 C)] 98.5 F (36.9 C) (08/15 0400) Pulse Rate:  [66-96] 72 (08/15 0715) Resp:  [7-28] 11 (08/15 0715) BP: (103-159)/(50-105) 122/61 (08/15 0700) SpO2:  [93 %-100 %] 99 % (08/15 0753) Arterial Line BP: (67-143)/(39-67) 101/43 (08/15 0715) FiO2 (%):  [30 %-40 %] 30 % (08/14 1506) Last BM Date: 10/04/20  Intake/Output from previous day: 08/14 0701 - 08/15 0700 In: 2152.8 [I.V.:1272.7; Blood:630; IV Piggyback:250.1] Out: 378 [Urine:38; Emesis/NG output:100; Drains:240] Intake/Output this shift: No intake/output data recorded.  Abd: inc c/d/I, JP SS  Lab Results:  Recent Labs    10/04/20 1203 10/05/20 0348 10/05/20 0757  WBC 23.0* 24.7*  --   HGB 9.5* 9.5* 8.5*  HCT 28.6* 29.6* 25.0*  PLT 123* 120*  --    BMET Recent Labs    10/04/20 0340 10/05/20 0348 10/05/20 0757  NA 140 138 139  K 3.6 4.9 4.3  CL 102 103  --   CO2 26 26  --   GLUCOSE 115* 120*  --   BUN 24* 39*  --   CREATININE 2.54* 3.57*  --   CALCIUM 7.1* 7.0*  --    PT/INR Recent Labs    10/03/20 1047 10/04/20 1203  LABPROT 23.9* 17.1*  INR 2.1* 1.4*   ABG Recent Labs    10/03/20 0952 10/05/20 0757  PHART 7.369 7.310*  HCO3 22.4 27.7    Studies/Results: DG CHEST PORT 1 VIEW  Result Date: 10/04/2020 CLINICAL DATA:  75 year old female with a history of intubated EXAM: PORTABLE CHEST 1 VIEW COMPARISON:  10/03/2020 FINDINGS: Cardiomediastinal silhouette unchanged in size and contour. Right IJ sheath unchanged. Endotracheal tube sutured Lea position above the carina, terminating 4.2 cm above carina. Gastric tube projects over the mediastinum terminating out of the field of view. Surgical staples projecting over the bilateral upper chest as well as the upper midline abdomen. Improving aeration at the right lung base, with  decreased AC opacity. No pneumothorax. Fine reticulations throughout the lungs, similar to the comparison with no significant interlobular septal thickening. IMPRESSION: Improving aeration of the lungs compared to the prior, with no visualized pneumothorax. Unchanged endotracheal tube, gastric tube, right IJ sheath. Surgical changes of the bilateral upper chest and the midline abdomen. Electronically Signed   By: Corrie Mckusick D.O.   On: 10/04/2020 09:06    Anti-infectives: Anti-infectives (From admission, onward)    Start     Dose/Rate Route Frequency Ordered Stop   10/04/20 2130  vancomycin (VANCOREADY) IVPB 750 mg/150 mL        750 mg 150 mL/hr over 60 Minutes Intravenous  Once 10/04/20 2041 10/04/20 2331   10/04/20 1021  vancomycin variable dose per unstable renal function (pharmacist dosing)         Does not apply See admin instructions 10/04/20 1021     10/03/20 1000  ceFEPIme (MAXIPIME) 2 g in sodium chloride 0.9 % 100 mL IVPB        2 g 200 mL/hr over 30 Minutes Intravenous Every 24 hours 10/03/20 0831     10/01/20 0800  cefTRIAXone (ROCEPHIN) 2 g in sodium chloride 0.9 % 100 mL IVPB  Status:  Discontinued        2 g 200 mL/hr over 30 Minutes Intravenous Every 24 hours 10/01/20 0700 10/03/20 0754   09/30/20 1830  vancomycin (  VANCOREADY) IVPB 500 mg/100 mL  Status:  Discontinued        500 mg 100 mL/hr over 60 Minutes Intravenous Every 24 hours 10/16/2020 2245 10/04/20 1021   09/28/2020 2345  ceFEPIme (MAXIPIME) 2 g in sodium chloride 0.9 % 100 mL IVPB  Status:  Discontinued        2 g 200 mL/hr over 30 Minutes Intravenous Every 12 hours 09/24/2020 2250 10/01/20 0653   10/19/2020 1731  vancomycin (VANCOCIN) 1-5 GM/200ML-% IVPB       Note to Pharmacy: Cameron Sprang   : cabinet override      10/15/2020 1731 09/30/20 0544       Assessment/Plan: POD 3 removal infected aortic graft, extraanatomic bypass, duodenal serosal tear -care per vascular surgery and ccm -drain near duodenum without  bile and con't to SS -continue ng tube -would avoid cortrak placement and hold on trickle feeds -con't TPN  LOS: 6 days    [name] 10/05/2020

## 2020-10-05 NOTE — Evaluation (Signed)
Physical Therapy Evaluation Patient Details Name: [name] MRN: [id number] DOB: 09-27-1945 Today's Date: 10/05/2020   History of Present Illness  Pt is a 75 y.o. female admitted 10/11/2020 with L groin bleed; concern for L femoral stent bleeding. S/p I&D L groin patch angioplasty L common femoral artery pt is a 75 y/o female admitted 8/9, s/p removal of ruptured aorta bi-femoral bypass graft, 8/12 s/p R axillary bi-femoral bypass graft and L axillary popliteal bypass graft. Pt sustained small serosal injury to duodenum while removing infected aortic graft and also 8/12,  s/p ureteroureterostomy with ureteral stent placement/exchange.  PMH includes PVD, stroke, IBS, hepatitis, carotid artery occlusion, surgical wound infection w/ L groin debridement 01/2020, anxiety, depression.  Clinical Impression  Pt admitted with/for infected groin, s/p removal of infected BPG and addition of axillary to bi-femoral and popliteal BPGs.  Pt currently low arousal and at total assist of 2 persons.   Pt currently limited functionally due to the problems listed. ( See problems list.)   Pt will benefit from PT to maximize function and safety in order to get ready for next venue listed below.;n    Follow Up Recommendations CIR;Other (comment);Supervision/Assistance - 24 hour (TBD as pt's status changes.)    Equipment Recommendations  Other (comment) (TBA)    Recommendations for Other Services Rehab consult     Precautions / Restrictions Precautions Precautions: Fall      Mobility  Bed Mobility Overal bed mobility: Needs Assistance Bed Mobility: Rolling;Sidelying to Sit;Sit to Sidelying Rolling: Total assist;+2 for physical assistance Sidelying to sit: Total assist;+2 for physical assistance     Sit to sidelying: Total assist;+2 for physical assistance General bed mobility comments: pt unable to follow commands or given any assist during transition to EOB.    Transfers                  General transfer comment: NT today, pt's level of arousal makes transfers unsafe  Ambulation/Gait                Stairs            Wheelchair Mobility    Modified Rankin (Stroke Patients Only)       Balance Overall balance assessment: Needs assistance Sitting-balance support: Feet unsupported;No upper extremity supported Sitting balance-Leahy Scale: Zero Sitting balance - Comments: no attempts to right herself or balance                                     Pertinent Vitals/Pain Pain Assessment: Faces Faces Pain Scale: Hurts little more Pain Location: expect incisional Pain Descriptors / Indicators: Moaning;Other (Comment) (generalized movements) Pain Intervention(s): Monitored during session;Premedicated before session;Limited activity within patient's tolerance;Repositioned    Home Living Family/patient expects to be discharged to:: Private residence Living Arrangements: Spouse/significant other Available Help at Discharge: Family;Available 24 hours/day Type of Home: House Home Access: Stairs to enter Entrance Stairs-Rails:  (rail) Technical brewer of Steps: 1 Home Layout: One level Home Equipment: Walker - 2 wheels;Cane - single point;Shower seat;Tub bench;Grab bars - tub/shower      Prior Function Level of Independence: Independent         Comments: not driving but very independnet     Hand Dominance        Extremity/Trunk Assessment   Upper Extremity Assessment Upper Extremity Assessment: Defer to OT evaluation    Lower Extremity Assessment Lower Extremity Assessment: RLE  deficits/detail;LLE deficits/detail RLE Deficits / Details: no movement to command or tactile infput LLE Deficits / Details: no movement to command, attempt to w/d from input to bottom of her L feet.       Communication   Communication: Other (comment) (ETT)  Cognition Arousal/Alertness: Lethargic;Suspect due to medications   Overall  Cognitive Status: Difficult to assess                                 General Comments: Sedated on ETT      General Comments General comments (skin integrity, edema, etc.): pt's daughter elicited no more response than the therapist.  Pt responded generally to all input except x2 movement or grabbing her L hand in sitting produced a rotation and downward flexion toward the affected hand.    Exercises Other Exercises Other Exercises: PROM to bil UE's and LE's   Assessment/Plan    PT Assessment Patient needs continued PT services  PT Problem List Decreased strength;Decreased activity tolerance;Decreased balance;Decreased mobility;Decreased coordination;Cardiopulmonary status limiting activity;Pain       PT Treatment Interventions DME instruction;Gait training;Functional mobility training;Therapeutic activities;Therapeutic exercise;Balance training;Patient/family education    PT Goals (Current goals can be found in the Care Plan section)  Acute Rehab PT Goals Patient Stated Goal: Back to PLOF, per dtr. PT Goal Formulation: With patient/family Time For Goal Achievement: 10/19/20 Potential to Achieve Goals: Good    Frequency Min 3X/week   Barriers to discharge        Co-evaluation               AM-PAC PT "6 Clicks" Mobility  Outcome Measure Help needed turning from your back to your side while in a flat bed without using bedrails?: Total Help needed moving from lying on your back to sitting on the side of a flat bed without using bedrails?: Total Help needed moving to and from a bed to a chair (including a wheelchair)?: Total Help needed standing up from a chair using your arms (e.g., wheelchair or bedside chair)?: Total Help needed to walk in hospital room?: Total Help needed climbing 3-5 steps with a railing? : Total 6 Click Score: 6    End of Session Equipment Utilized During Treatment: Oxygen Activity Tolerance: Patient tolerated treatment  well Patient left: in bed;with call bell/phone within reach;with bed alarm set;with family/visitor present Nurse Communication: Mobility status PT Visit Diagnosis: Other abnormalities of gait and mobility (R26.89);Muscle weakness (generalized) (M62.81);Pain Pain - part of body:  (various axillary/abdominal/groin incisions.)    Time: [id number] PT Time Calculation (min) (ACUTE ONLY): 27 min   Charges:   PT Evaluation $PT Eval Moderate Complexity: 1 Mod           10/05/2020  Ginger Carne., PT Acute Rehabilitation Services [phone]  (pager) [phone]  (office)  [name] Antwanette Wesche 10/05/2020, 3:15 PM

## 2020-10-05 NOTE — Progress Notes (Signed)
PHARMACY - TOTAL PARENTERAL NUTRITION CONSULT NOTE   Indication:  intolerance to enteral feeding  Patient Measurements: Height: 4' 11.02" (149.9 cm) Weight: 50.2 kg (110 lb 10.7 oz) IBW/kg (Calculated) : 43.24 TPN AdjBW (KG): 45.6 Body mass index is 22.34 kg/m. Usual Weight:   Assessment:  Patient present 8/9 with L femoral stent bleeding/infection despite Omadacycline PTA. 8/9 Status postrepair of left femoral anastomosis with bovine pericardial patch for bleeding. There was small serosal injury to duodenum s/p procedure 8/12 patched with omentum. High risk for ileus. No tube feeds or oral intake given duodenal injury.  No Cortak should be placed per vascular note 8/14 due to high risk of perforation in area of repair  Con't to hold on cortrak + trickle feeds per surgery  PMH: carotid artery occlusion, CVA 2001, hyper otitis, HTN, HLD, anxiety, hypothyroidism, IBS, hydronephrosis, COPD, MI, PVD, PAF, depression, hepatitis, aortobifemoral bypass graft 10 years ago, rheumatic fever,  Glucose / Insulin: No h/o DM. 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 CBGs 80-120 with TPN start, no SSI used  Electrolytes: K 3.6>4.3. CoCa 8.46. Phos 4.7>6.5, Mg 2.4, Ca/Phos product <55.  Renal: Right ureteroureterostomy 8/12 with ureteral stent placement/exchange for complete mid ureteral transection. - Scr 2.54>3.57. UOP marginal last 24h.  Hepatic: LFT's elevated 8/13. AST 533>854, ALT 553>1117, Tbili 0.7>0.9 TG 94>178.  Intake / Output; MIVF: No MIVF - Duodenal drain: 262m - NG tube 200>1086m- LBM: PTA  GI Imaging: GI Surgeries / Procedures:  - 8/9 Status postrepair of left femoral anastomosis with bovine pericardial patch for bleeding - 8/12:  REMOVAL OF AORTA BI-FEMORAL BYPASS GRAFT, placed RIGTH AXILLARY BI-FEMORAL BYPASS GRAFT and LEFT AXILLARY POPLITEAL BYPASS GRAFT. There was small serosal injury to duodenum s/p procedure.  Central access: CVC TL, IJ 8/12 TPN start date: 8/14  Nutritional Goals (per RD  recommendation on ): kCal:  , Protein:  , Fluid:   Goal TPN rate is   mL/hr (provides   g of protein and   kcals per day)  Current Nutrition:  NPO TPN  Plan:  Concentrate and continue TPN at 4053mr, conservatively holding rate for today  Electrolytes in TPN: Na 42m59m, Decrease K to 10mE45m Ca 5mEq/64mMg 0mEq/L78mnd Phos 0 mmol/L. Cl:Ac 1:1 Add standard MVI, with concerning liver enzymes will remove trace elements and only add back zinc with current renal fxn Continue SSI q 4hrs. Adjust as needed BMP, Mg/phos in AM F/u RD assessment and recs for nutritional goals   JonathaBertis RuddyD Clinical Pharmacist Please check AMION for all [facility] 10/05/2020 8:28 AM

## 2020-10-05 NOTE — Evaluation (Addendum)
Occupational Therapy Evaluation Patient Details Name: [name] MRN: [id number] DOB: [birth year] Today's Date: 10/05/2020    History of Present Illness Pt is a 75 y.o. female admitted 09/27/2020 with L groin bleed; concern for L femoral stent bleeding. 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 Pt sustained small serosal injury to duodenum while removing infected aortic graft and also 8/12,  s/p ureteroureterostomy with ureteral stent placement/exchange.  PMH includes PVD, stroke, IBS, hepatitis, carotid artery occlusion, surgical wound infection w/ L groin debridement 01/2020, anxiety, depression.   Clinical Impression   PTA patient independent with ADLs, mobility, and IADLs. Admitted for above and presenting with problem list below, including impaired cognition, weakness, decreased activity tolerance, and impaired balance. She currently requires +2 total assist with bed mobility and is total assist for ADLs.  She turns her head but is not following commands or tracking. She turns towards L hand when touched, but otherwise does not engage with therapists or family. Will benefit from further OT services while admitted and after dc at CIR level (pending progress) to optimize independence and return to PLOF.     Follow Up Recommendations  CIR;Supervision/Assistance - 24 hour (pending progress)    Equipment Recommendations  Other (comment) (tBD)    Recommendations for Other Services       Precautions / Restrictions Precautions Precautions: Fall Precaution Comments: abdominal incision, NG, JP drains      Mobility Bed Mobility Overal bed mobility: Needs Assistance Bed Mobility: Rolling;Sidelying to Sit;Sit to Sidelying Rolling: Total assist;+2 for physical assistance Sidelying to sit: Total assist;+2 for physical assistance     Sit to  sidelying: Total assist;+2 for physical assistance General bed mobility comments: pt unable to follow commands or given any assist during transition to EOB.    Transfers                 General transfer comment: NT today, pt's level of arousal makes transfers unsafe    Balance Overall balance assessment: Needs assistance Sitting-balance support: Feet unsupported;No upper extremity supported Sitting balance-Leahy Scale: Zero Sitting balance - Comments: no attempts to right herself or balance, requires total assist                                   ADL either performed or assessed with clinical judgement   ADL Overall ADL's : Needs assistance/impaired                                     Functional mobility during ADLs: +2 for physical assistance;Total assistance;+2 for safety/equipment General ADL Comments: total assist for all self care     Vision   Additional Comments: further assessment required, pt not tracking today     Perception     Praxis      Pertinent Vitals/Pain Pain Assessment: Faces Faces Pain Scale: Hurts little more Pain Location: incisional, generalized Pain Descriptors / Indicators: Moaning;Other (Comment) (generalized movements) Pain Intervention(s): Monitored during session;Repositioned;Premedicated before session;Limited activity within patient's tolerance     Hand Dominance     Extremity/Trunk Assessment Upper Extremity Assessment Upper Extremity Assessment: Generalized weakness;Difficult to assess due to impaired cognition (edema to BUEs, no purposeful movement or movement to commands; L  UE reponds to touch only)   Lower Extremity Assessment Lower Extremity Assessment: RLE deficits/detail;LLE deficits/detail RLE Deficits / Details: no movement to command or tactile infput LLE Deficits / Details: no movement to command, attempt to w/d from input to bottom of her L feet.       Communication  Communication Communication: Receptive difficulties;Expressive difficulties   Cognition Arousal/Alertness: Lethargic;Suspect due to medications Behavior During Therapy: Flat affect Overall Cognitive Status: Difficult to assess                                 General Comments: patient with limited verbalizations, nodding head at times but does not engage with therapists or family   General Comments  daughter and spouse present, daughter engaging with pt but no improved repsonse; 3L O2 via [place] VSS    Exercises Exercises: Other exercises Other Exercises Other Exercises: PROM to BUEs, elevated on pillows to reduce edema   Shoulder Instructions      Home Living Family/patient expects to be discharged to:: Private residence Living Arrangements: Spouse/significant other Available Help at Discharge: Family;Available 24 hours/day Type of Home: House Home Access: Stairs to enter [employer] of Steps: 1 Entrance Stairs-Rails:  (rail) Home Layout: One level     Bathroom Shower/Tub: Teacher, early years/pre: Standard     Home Equipment: Environmental consultant - 2 wheels;Cane - single point;Shower seat;Tub bench;Grab bars - tub/shower          Prior Functioning/Environment Level of Independence: Independent        Comments: not driving but very independnet        OT Problem List: Decreased strength;Decreased activity tolerance;Impaired balance (sitting and/or standing);Decreased range of motion;Impaired vision/perception;Decreased coordination;Decreased cognition;Decreased safety awareness;Decreased knowledge of use of DME or AE;Decreased knowledge of precautions;Cardiopulmonary status limiting activity;Impaired UE functional use;Pain;Increased edema      OT Treatment/Interventions: Self-care/ADL training;DME and/or AE instruction;Therapeutic exercise;Therapeutic activities;Cognitive remediation/compensation;Visual/perceptual  remediation/compensation;Patient/family education;Balance training;Energy conservation    OT Goals(Current goals can be found in the care plan section) Acute Rehab OT Goals Patient Stated Goal: per daughter, back to independence and taking care of her husband OT Goal Formulation: With family Time For Goal Achievement: 10/19/20 Potential to Achieve Goals: Fair  OT Frequency: Min 2X/week   Barriers to D/C:            Co-evaluation PT/OT/SLP Co-Evaluation/Treatment: Yes Reason for Co-Treatment: Complexity of the patient's impairments (multi-system involvement);Necessary to address cognition/behavior during functional activity;For patient/therapist safety;To address functional/ADL transfers   OT goals addressed during session: ADL's and self-care      AM-PAC OT "6 Clicks" Daily Activity     Outcome Measure Help from another person eating meals?: Total Help from another person taking care of personal grooming?: Total Help from another person toileting, which includes using toliet, bedpan, or urinal?: Total Help from another person bathing (including washing, rinsing, drying)?: Total Help from another person to put on and taking off regular upper body clothing?: Total Help from another person to put on and taking off regular lower body clothing?: Total 6 Click Score: 6   End of Session Equipment Utilized During Treatment: Oxygen Nurse Communication: Mobility status  Activity Tolerance: Patient limited by lethargy Patient left: in bed;with call bell/phone within reach;with family/visitor present;with bed alarm set;with restraints reapplied;with SCD's reapplied  OT Visit Diagnosis: Other abnormalities of gait and mobility (R26.89);Muscle weakness (generalized) (M62.81);Other symptoms and signs involving cognitive function;Pain Pain - part of  body:  (generalized, incisional)                Time: [id number] OT Time Calculation (min): 27 min Charges:  OT General Charges $OT Visit: 1  Visit OT Evaluation $OT Eval High Complexity: 1 High  Jolaine Artist, OT Acute Rehabilitation Services Pager [phone] Office [phone]  OT charges: 1 High Evaluation Time: 1235-1302 (27 minutes) Co-treatment with PT  [name] 10/05/2020, 5:55 PM

## 2020-10-05 NOTE — Progress Notes (Signed)
Echocardiogram 2D Echocardiogram has been performed.  Oneal Deputy Monico Sudduth RDCS 10/05/2020, 3:15 PM

## 2020-10-05 NOTE — Progress Notes (Signed)
Nutrition Follow-up  DOCUMENTATION CODES:   Not applicable  INTERVENTION:   TPN to meet 100% nutritional needs; TPN per Pharmacy   NUTRITION DIAGNOSIS:   Increased nutrient needs related to acute illness as evidenced by estimated needs.  Being addressed via TPN  GOAL:   Patient will meet greater than or equal to 90% of their needs  Progressing  MONITOR:   PO intake, Supplement acceptance, Labs, Weight trends  REASON FOR ASSESSMENT:   Malnutrition Screening Tool    ASSESSMENT:   75 yo female admitted with infected left groin aortobifem. PMH includes HLD, stroke, IBS, PVD  8/09 Debridement and irrigation of left groin patch angioplasty L common fem artery  8/12 Removal of aorta bi-fem bypass graft, placement of right axillary bi-fem bypass graft, left axillary popliteal bypass great, ureteral stent placement, Serosal injury to duodenum requiring patch 8/14 Extubated, TPN initiated  Plan for CRRT today, currently on phenylephrine  NPO Pt with small serosal injury to duodenum during vascular surgery requiring patch with omentum NG tube with 100 mL JP drain with 240 mL  Net +13 L post surgery  TPN currently at 40 ml/hr  Labs: phosphorus 6.5, Creatinine 3.57, BUN 39, elevated LFTs Meds: KCl, ss novolog   Diet Order:   Diet Order             Diet NPO time specified  Diet effective now                   EDUCATION NEEDS:   Not appropriate for education at this time  Skin:  Skin Assessment: Skin Integrity Issues: Skin Integrity Issues:: Incisions Incisions: abdomen, groin  Last BM:  8/14  Height:   Ht Readings from Last 1 Encounters:  10/11/2020 4' 11.02" (1.499 m)    Weight:   Wt Readings from Last 1 Encounters:  10/01/20 50.2 kg     BMI:  Body mass index is 22.34 kg/m.  Estimated Nutritional Needs:   Kcal:  1550-1750 kcals  Protein:  78-88 g  Fluid:  >/= 1.6 L   Kerman Passey MS, RDN, LDN, CNSC Registered Dietitian  III Clinical Nutrition RD Pager and On-Call Pager Number Located in [place]

## 2020-10-05 NOTE — Progress Notes (Signed)
Inpatient Rehab Admissions Coordinator Note:   Per PT/OT recommendations, pt was screened for CIR candidacy by Gayland Curry, MS, CCC-SLP.  At this time we are not recommending an inpatient rehab consult. Pt noted to have decreased arousal and require toatl A +2 during evaluations.  Will follow from a distance progress with therapies and medical workup.  Please contact me with questions.     Gayland Curry, [place], [facility] Admissions Coordinator [phone] 10/05/20 6:29 PM

## 2020-10-05 NOTE — Progress Notes (Addendum)
[facility]  Subjective  - Eyes open, does not respond verbally or follow directions to move.   Objective 107/60 96 98.5 F (36.9 C) (Oral) 17 100%  Intake/Output Summary (Last 24 hours) at 10/05/2020 0653 Last data filed at 10/05/2020 0600 Gross per 24 hour  Intake 2152.81 ml  Output 360 ml  Net 1792.81 ml    Palpable pedal pulses B DP Abdomin mild distension, incisional dressings in place with right LQ JP in place.  240 cc drain OP last 24 hours, NG OP 100 cc Groin dressings in place, no active drainage Lungs non labored breathing Heart RRR, hypotension supported with NEO  Assessment/Planning: POD # 3 Procedure: 1.  Right axillary femoral bypass (8 mm dacron)  2.  Left axillary to superficial femoral artery bypass (8 mm ring supported PTFE)  3.  Removal of aortobifemoral bypass with oversewing of aortic stump just below the renal arteries  4.  Vein patch angioplasty left common femoral artery  Good in flow with palpable pedal pulses Extubated 10/04/20, O2 4L [place] with 100% O2 SAT  Urine OP 20 cc very dark bloody OP in foley bag Cr 3.57 fluid support with >2000 cc IV.    Will call Renal consult today.  S/P EXPLORATORY LAPAROTOMY INSERTION OF 37F x 26cm CONTOUR STENT IN RIGHT URETER AND RIGHT URETEROURETEROSTOMY Urology following  TPN started for nutrition  WBC 24.7 on vancomycin and cefepime. Methicillin resistant staph epi in AFB graft.    HGB stable at 9.5  Prophylaxis Protonix and SQ Heparin.      Roxy Horseman 10/05/2020 6:53 AM --  Laboratory Lab Results: Recent Labs    10/04/20 1203 10/05/20 0348  WBC 23.0* 24.7*  HGB 9.5* 9.5*  HCT 28.6* 29.6*  PLT 123* 120*   BMET Recent Labs    10/04/20 0340 10/05/20 0348  NA 140 138  K 3.6 4.9  CL 102 103  CO2 26 26  GLUCOSE 115* 120*  BUN 24* 39*  CREATININE 2.54* 3.57*  CALCIUM 7.1* 7.0*    COAG Lab Results  Component Value Date   INR 1.4 (H)  10/04/2020   INR 2.1 (H) 10/03/2020   INR 1.5 (H) 10/21/2020   No results found for: PTT  I have seen and evaluated the patient. I agree with the PA note as documented above.  Postop day 3 status post right axillary to femoral bypass and left axillary to SFA bypass and excision of infected aortobifem.  She remains critically ill in the ICU. Neuro: Opens her eyes but does not really follow commands.  Extubated yesterday. CV: Remains on 140 mcg neo.  Hemoglobin stable at 9.5. Pulm: Extubated yesterday.  4 L nasal cannula.  Getting treatment this morning. GI: Keep NPO.  NG tube.  Has drains for duodenal injury that is putting out serosanguineous.  TPN started over weekend.  Rising liver enzymes is concerning. Renal: Minimal urine output overnight.  Creatinine up to 3.57 with rising BUN.  Suprarenal clamp.  Will consult nephrology.  Pressure critical care assistance if she needs catheter.  Drain around ureter repair serosanguinous. ID: Continue Vanc cefepime. Extremities: Palpable DP pulses.  Dressings removed and staples to both axillary exposures as well as to the midline and both groins are all clean dry and intact.  We will keep drains.  Keep in ICU.  Marty Heck, MD [facility] Office: [phone]

## 2020-10-05 NOTE — Procedures (Signed)
Central Venous Catheter Insertion Procedure Note  VERNE SECCOMBE  [id number]  20-Nov-1945  Date:10/05/20  Time:2:38 PM   Provider Performing:Pete Johnette Abraham Kary Kos   Procedure: Insertion of Non-tunneled Central Venous Catheter([id number])with US guidance [id number])    Indication(s) Hemodialysis  Consent Risks of the procedure as well as the alternatives and risks of each were explained to the patient and/or caregiver.  Consent for the procedure was obtained and is signed in the bedside chart  Anesthesia Topical only with 1% lidocaine   Timeout Verified patient identification, verified procedure, site/side was marked, verified correct patient position, special equipment/implants available, medications/allergies/relevant history reviewed, required imaging and test results available.  Sterile Technique Maximal sterile technique including full sterile barrier drape, hand hygiene, sterile gown, sterile gloves, mask, hair covering, sterile ultrasound probe cover (if used).  Procedure Description Area of catheter insertion was cleaned with chlorhexidine and draped in sterile fashion.   With real-time ultrasound guidance a HD catheter was placed into the left internal jugular vein.  Nonpulsatile blood flow and easy flushing noted in all ports.  The catheter was sutured in place and sterile dressing applied.  Complications/Tolerance None; patient tolerated the procedure well. Chest X-ray is ordered to verify placement for internal jugular or subclavian cannulation.  Chest x-ray is not ordered for femoral cannulation.  EBL Minimal  Specimen(s) None  Erick Colace ACNP-BC [place] Pager # [phone] OR # [phone] if no answer

## 2020-10-05 NOTE — Progress Notes (Signed)
NAME:  MERCEDES WORTHY, MRN:  [id number], DOB:  Nov 08, 1945, LOS: 6 ADMISSION DATE:  10/01/2020, CONSULTATION DATE:  8/12 REFERRING MD:  Dr. Oneida Alar, CHIEF COMPLAINT:  removal of infected aorta bi-femoral bypass graft; placement of R axillay BI-FEMORAL BYPASS GRAFT and LEFT AXILLARY POPLITEAL BYPASS GRAFT   History of Present Illness:  Patient is a 75 yo F w/ pertinent PMH of carotid artery occlusion, CVA, hyper otitis, HTN, HLD, hypothyroidism, IBS, hydronephrosis, COPD, MI, PVD, PAF, aortobifemoral bypass graft 10 years ago presents to [facility] on 10/14/2020 with left femoral stent bleeding and infection.  Patient has been on Omadacycline for 2 days prior to admission for infection and has recently noticed profuse bleeding from the site. Has had I&D of left groin in December 2021. Bleeding was stopped and EMS called to bring to [facility] on 8/9. Last dose of Eliquis for PAF on 8/8. Hgb and bp stable. Vascular surgery consulted. On 8/9 Status postrepair of left femoral anastomosis with bovine pericardial patch for bleeding surgery performed. 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 Started on Vanc/cefepime. Infectious disease consulted 8/10. On Vanc and ceftriaxone.  On 8/12, Vascular surgery  performed: REMOVAL OF AORTA BI-FEMORAL BYPASS GRAFT, placed RIGTH AXILLARY BI-FEMORAL BYPASS GRAFT and LEFT AXILLARY POPLITEAL BYPASS GRAFT. There was small serosal injury to duodenum s/p procedure. General Surgery consulted. Patched with omentum and left NG tube with drain. Urology placed Right ureteroureterostomy with ureteral stent placement/exchange for complete mid ureteral transection. Patient arrived to ICU intubated.  PCCM consulted for management of mechanical ventilation and medical management.  Pertinent  Medical History   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   Cerebrovascular occlusion 2001   Extracranial with Hx of TIA's   Depression    Hepatitis     Hyperlipidemia    IBS (irritable bowel syndrome)    Myocardial infarction [facility])    Peripheral vascular disease (HCC)    had Carotid En   Rheumatic fever    Stroke ([place])    no residual- except left side of face has a small "twist"   Thyroid disease    Hypothyroidism     Significant Hospital Events: Including procedures, antibiotic start and stop dates in addition to other pertinent events   8/12: pccm consulted for management of mechanical ventilation s/p surgery placement of RIGHT axillay BI-FEMORAL BYPASS GRAFT and LEFT AXILLARY POPLITEAL BYPASS GRAFT  8/14 extubated  Interim History / Subjective:  Extubated yesterday Confused Minimal UoP  Objective   Blood pressure 122/61, pulse 72, temperature 98.5 F (36.9 C), temperature source Oral, resp. rate 11, height 4' 11.02" (1.499 m), weight 50.2 kg, SpO2 100 %.    Vent Mode: CPAP;PSV FiO2 (%):  [30 %-40 %] 30 % Set Rate:  [28 bmp] 28 bmp Vt Set:  [450 mL] 450 mL PEEP:  [5 cmH20] 5 cmH20 Pressure Support:  [5 cmH20-10 cmH20] 5 cmH20 Plateau Pressure:  [17 cmH20] 17 cmH20   Intake/Output Summary (Last 24 hours) at 10/05/2020 0800 Last data filed at 10/05/2020 0600 Gross per 24 hour  Intake 1837.81 ml  Output 378 ml  Net 1459.81 ml    Filed Weights   10/11/2020 2230 09/30/20 0600 10/01/20 0500  Weight: 45.6 kg 48.2 kg 50.2 kg   UOP 83cc  Examination: Ill appearing woman lying in bed moving arms around Lungs clear +SEM, ext warm L>R leg edema Incision sites undressed by vascular while in room: look CDI, R  JP drain x 2 to abdomen Abdomen soft, hypoactive BS Moves all 4 ext but not to command Seems delirious, no answering questions  No new imaging WBC up slightly LFTs up CBC 9.5>>8.5 Plts drifting down 38 urine OP / 24h   Resolved Hospital Problem list   Acute respiratory failure with hypoxia s/p surgical procedure requiring mechanical ventilation Concern for RLL HAP  Assessment & Plan:  Aortobifemoral  bypass graft infection, unknown organism Removal of infected aorta bi-fem bypass graft Right axillary bi-fem bypass graft Axillary popliteal bypass graft - abx per ID - postop wound care per CCS and vascular - heparin dvt ppx for now, at some point if H/H stable need to rechallenge with [facility]  Small serosal injury to duodenum s/p repair: Patched with omentum and left NG tube with drain.   High risk for ileus given extensive vascular surgery.  Per surgery, would wait several days before initiating tube feeds given extent of surgery and need for laceration repair of duodenum.  No Cortrak without talking to general surgery> high risk for perforation given area of repair -NPO, NGT to LIWS -TPN  Right ureteroureterostomy 8/12 with ureteral stent placement/exchange for complete mid ureteral transection.  Creatinine and fluid from drain is similar to serum level, no leak. P: -Appreciate Urology's management  -Continue Foley catheter for minimum 10 days -keep ureteral stent at least 6 weeks -Strict I's/O  Shock, improved, related to ABLA and fluid shifts post op ABLA Rising white count -wean pressors to maintain MAP >65 and SBP >90 - Check INR, replete PRN - Avoid hypothermia and acidemia - Low threshold to re-image abdomen  Post op ATN/renal failure- no urine output, will need HD soon - Nephrology consult after discussion with vascular  Hyperglycemia, prediabetes-controlled with minimal insulin P: - SSI as needed - Goal BG 140-180 while admitted to the ICU  COPD without acute exacerbation - Nebs as ordered  hypothyroidism -Continue IV Synthroid until able to take p.o.  H/o HTN Paroxysmal Afib -hold home beta blocker while on pressors; can use amiodarone if HR control is required -Continue to hold home Eliquis  HLD -hold statin while NPO  Shock liver- check RUQ r/o portal thrombus, echo r/o RV failure, need to discuss TPN with pharmD, may need to redue  Metabolic  encephalopathy- related to ATN, shock liver and inability to clear sedating agents from surgery/postop  Multiorgan failure postop in 75 year old woman with multiple comorbidities- may get palliative on board in next day or two if primary is okay with it  Best Practice (right click and "Reselect all SmartList Selections" daily)   Diet/type: tpn DVT prophylaxis: heparin GI prophylaxis: PPI Lines: Central line and Arterial Line Foley:  Yes, and it is still needed Code Status:  full code Last date of multidisciplinary goals of care discussion per primary   Patient critically ill due to shock, multiorgan failure Interventions to address this today pressor titration Risk of deterioration without these interventions is high  I personally spent 48 minutes providing critical care not including any separately billable procedures  Erskine Emery MD [facility]  Prefer epic messenger for cross cover needs If after hours, please call E-link

## 2020-10-05 NOTE — Progress Notes (Addendum)
[facility] for Infectious Disease    Date of Admission:  10/15/2020   Total days of antibiotics 7/vanco & cefepime   ID: [name] is a 75 y.o. female with infected vascular graft s/p removal, c/b small duodenal tear and ureteral tear. POD#3 of right axillary fem bypaoss, left axillary to superificial fem artery bypass and removal of aortobifemoral bypass with oversweing of aortic stump below renal arteries, vein patch angioplasty left CFA. On TPN still on phenylephrine 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), initial encounter ([place])    Subjective: Afebrile, patient remains delirious labs reveal worsening aki. Wbc at 24.7K, ALT/AST 1117/854/ALP113. RUQ U/S Only 59m from 2 JP drains this morning (yesterday's shift of 1161m  Medications:   sodium chloride   Intravenous Once   arformoterol  15 mcg Nebulization BID   budesonide (PULMICORT) nebulizer solution  0.25 mg Nebulization BID   calcium carbonate  1 tablet Oral Daily   chlorhexidine  15 mL Mouth Rinse BID   Chlorhexidine Gluconate Cloth  6 each Topical Daily   heparin injection (subcutaneous)  5,000 Units Subcutaneous Q8H   insulin aspart  0-9 Units Subcutaneous Q4H   levothyroxine  50 mcg Intravenous Daily   mouth rinse  15 mL Mouth Rinse q12n4p   pantoprazole (PROTONIX) IV  40 mg Intravenous QHS   sodium chloride flush  10-40 mL Intracatheter Q12H    Objective: Vital signs in last 24 hours: Temp:  [97.9 F (36.6 C)-99.5 F (37.5 C)] 99.5 F (37.5 C) (08/15 0843) Pulse Rate:  [66-96] 74 (08/15 0930) Resp:  [7-28] 12 (08/15 0930) BP: (102-159)/(50-105) 108/58 (08/15 0930) SpO2:  [93 %-100 %] 99 % (08/15  0930) Arterial Line BP: (67-143)/(39-67) 106/45 (08/15 0930) FiO2 (%):  [30 %] 30 % (08/14 1506)   Physical Exam  Constitutional:   appears well-developed and well-nourished. No distress.  HENT: [place]/AT, PERRLA, no scleral icterus Mouth/Throat: Oropharynx is clear and moist. No oropharyngeal exudate. Ng in place Cardiovascular: Normal rate, regular rhythm and normal heart sounds. Exam reveals no gallop and no friction rub.  No murmur heard.  Pulmonary/Chest: Effort normal and breath sounds normal. No respiratory distress.  has no wheezes.  Neck = supple, no nuchal rigidity.right IJ in place Abdominal: Soft. Bowel sounds are normal.  3 stapled incisions which are c/d/I. 2 jp drain on the right with serosanginous fluid Ext: anasarca. Warm extremities Neurological: moves upper extremities spontaneously Skin: Skin is warm and dry. No rash noted. No erythema.  Psychiatric: delirious   Lab Results Recent Labs    10/04/20 0340 10/04/20 1203 10/05/20 0348 10/05/20 0757  WBC 18.2* 23.0* 24.7*  --   HGB 6.6* 9.5* 9.5* 8.5*  HCT 19.6* 28.6* 29.6* 25.0*  NA 140  --  138 139  K 3.6  --  4.9 4.3  CL 102  --  103  --   CO2 26  --  26  --   BUN 24*  --  39*  --   CREATININE 2.54*  --  3.57*  --    Liver Panel Recent Labs    10/03/20 0608 10/05/20 0348  PROT 3.8* 4.0*  ALBUMIN 2.3* 1.7*  AST 533* 854*  ALT 553*  1,117*  ALKPHOS 33* 113  BILITOT 0.7 0.9     Microbiology: Recent cx NGTD.  Remote cx from Dec 2021: MRSE Studies/Results: DG CHEST PORT 1 VIEW  Result Date: 10/04/2020 CLINICAL DATA:  75 year old female with a history of intubated EXAM: PORTABLE CHEST 1 VIEW COMPARISON:  10/03/2020 FINDINGS: Cardiomediastinal silhouette unchanged in size and contour. Right IJ sheath unchanged. Endotracheal tube sutured Lea position above the carina, terminating 4.2 cm above carina. Gastric tube projects over the mediastinum terminating out of the field of view. Surgical staples  projecting over the bilateral upper chest as well as the upper midline abdomen. Improving aeration at the right lung base, with decreased AC opacity. No pneumothorax. Fine reticulations throughout the lungs, similar to the comparison with no significant interlobular septal thickening. IMPRESSION: Improving aeration of the lungs compared to the prior, with no visualized pneumothorax. Unchanged endotracheal tube, gastric tube, right IJ sheath. Surgical changes of the bilateral upper chest and the midline abdomen. Electronically Signed   By: Corrie Mckusick D.O.   On: 10/04/2020 09:06     Assessment/Plan: Infected vascular graft, now removed = hx of MRSE, no recent cx + data available. Is supratherapeutic on vancomycin due to AKI. We will switch to daptomycin tomorrow. Due to small duodenal injury, we will continue on cefepime, ADD metronidazole for now.  AKI= defer to nephrology and PCCM to transition to CRRT  Leukocytosis = recommend repeat CBC tomorrow to see that it is trending downward  Transaminitis= suspect due to hypoperfusion/shock liver +/- effects of TPN. Awaiting results of RUQ U/S  Hypotension = continues to require vasopressor support  [facility] for Infectious Diseases Cell: [phone] Pager: [phone]  10/05/2020, 9:48 AM

## 2020-10-05 NOTE — Consult Note (Addendum)
Reason for Consult: Acute kidney injury Referring Physician: Ina Homes, MD (CCM)  HPI:  75 year old Caucasian woman with past medical history significant for CVA, carotid artery occlusion, hypertension, dyslipidemia, hypothyroidism, irritable bowel syndrome, chronic obstructive lung disease, paroxysmal atrial fibrillation, history of aortobifemoral bypass grafting 10 years ago complicated by hydronephrosis and normal renal function at baseline (creatinine 0.6-1.0).  She was admitted 6 days ago with bleeding and associated infection from the left femoral stent while on omadacycline.  She underwent incision/debridement and irrigation with left groin patch angioplasty of the left CFA on 10/20/2020 and then  on 09/23/2020 underwent right axillary femoral bypass, left axillary SFA bypass, removal of aortobifemoral bypass with oversewing of infrarenal aortic stump, vein patch angioplasty of left CFA as well as right ureteroureterostomy with ureteral stent placement/exchange for ureteral transection along with enterotomy repair.  Suprarenal clamp 33 minutes with EBL 500 cc.  She suffered anuric acute kidney injury postoperatively and creatinine is now up to 3.6 with urine output of 38 cc overnight.  She is net +13 L and remains ventilator dependent.  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   Cerebrovascular occlusion 2001   Extracranial with Hx of TIA's   Depression    Hepatitis    Hyperlipidemia    IBS (irritable bowel syndrome)    Myocardial infarction [facility])    Peripheral vascular disease (HCC)    had Carotid En   Rheumatic fever    Stroke (HCC)    no residual- except left side of face has a small "twist"   Thyroid disease    Hypothyroidism    Past Surgical History:  Procedure Laterality Date   CAROTID ENDARTERECTOMY  11/25/1999   S/P Left   CAROTID ENDARTERECTOMY  03/24/2004   Left CCA stent- Dr. Amedeo Plenty   CAROTID STENT  2006    CHOLECYSTECTOMY     COLONOSCOPY N/A 05/08/2013   Procedure: COLONOSCOPY;  Surgeon: Rogene Houston, MD;  Location: [facility];  Service: Endoscopy;  Laterality: N/A;  230-moved to [place] notified pt   EYE SURGERY Bilateral    cataract removed lens implanted   INCISION AND DRAINAGE OF WOUND Left 10/19/2020   Procedure: IRRIGATION AND DEBRIDEMENT LEFT GROIN;;  Surgeon: Elam Dutch, MD;  Location: [place];  Service: Vascular;  Laterality: Left;   PATCH ANGIOPLASTY Left 09/28/2020   Procedure: PATCH ANGIOPLASTY LEFT COMMON FEMORAL ARTERY USING [place];  Surgeon: Elam Dutch, MD;  Location[facility];  Service: Vascular;  Laterality: Left;   PR VEIN BYPASS GRAFT,AORTO-FEM-POP  2001   WOUND DEBRIDEMENT Left 01/22/2020   Procedure: DEBRIDEMENT LEFT GROIN WOUND;  Surgeon: Rosetta Posner, MD;  Location: [facility] OR;  Service: Vascular;  Laterality: Left;    Family History  Problem Relation Age of Onset   Cancer Sister    Hypertension Father    Heart attack Mother    Hyperlipidemia Mother    Kidney disease Brother    Cancer Brother    Cancer Brother        Lung    Colon cancer Neg Hx     Social History:  reports that she has been smoking cigarettes. She has a 20.79 pack-year smoking history. She has never used smokeless tobacco. She reports that she does not drink alcohol and does not use drugs.  Allergies:  Allergies  Allergen Reactions   Penicillins Swelling   Citalopram Other (See Comments)  Nightmares 10/06/2020 Pt states that she is currently taking this drug.   Morphine And Related Nausea And Vomiting   Losartan Nausea Only and Other (See Comments)    Nausea and dizzy. 09/24/2020 Pt states she does not recall having an intolerance to this drug.    Medications: I have reviewed the patient's current medications. Scheduled:  sodium chloride   Intravenous Once   arformoterol  15 mcg Nebulization BID   budesonide (PULMICORT) nebulizer solution  0.25 mg Nebulization BID    calcium carbonate  1 tablet Oral Daily   chlorhexidine  15 mL Mouth Rinse BID   Chlorhexidine Gluconate Cloth  6 each Topical Daily   heparin injection (subcutaneous)  5,000 Units Subcutaneous Q8H   insulin aspart  0-9 Units Subcutaneous Q4H   levothyroxine  50 mcg Intravenous Daily   mouth rinse  15 mL Mouth Rinse q12n4p   pantoprazole (PROTONIX) IV  40 mg Intravenous QHS   sodium chloride flush  10-40 mL Intracatheter Q12H   BMP Latest Ref Rng & Units 10/05/2020 10/05/2020 10/04/2020  Glucose 70 - 99 mg/dL - 120(H) 115(H)  BUN 8 - 23 mg/dL - 39(H) 24(H)  Creatinine 0.44 - 1.00 mg/dL - 3.57(H) 2.54(H)  BUN/Creat Ratio 12 - 28 - - -  Sodium 135 - 145 mmol/L 139 138 140  Potassium 3.5 - 5.1 mmol/L 4.3 4.9 3.6  Chloride 98 - 111 mmol/L - 103 102  CO2 22 - 32 mmol/L - 26 26  Calcium 8.9 - 10.3 mg/dL - 7.0(L) 7.1(L)   CBC Latest Ref Rng & Units 10/05/2020 10/05/2020 10/04/2020  WBC 4.0 - 10.5 K/uL - 24.7(H) 23.0(H)  Hemoglobin 12.0 - 15.0 g/dL 8.5(L) 9.5(L) 9.5(L)  Hematocrit 36.0 - 46.0 % 25.0(L) 29.6(L) 28.6(L)  Platelets 150 - 400 K/uL - 120(L) 123(L)     DG CHEST PORT 1 VIEW  Result Date: 10/04/2020 CLINICAL DATA:  75 year old female with a history of intubated EXAM: PORTABLE CHEST 1 VIEW COMPARISON:  10/03/2020 FINDINGS: Cardiomediastinal silhouette unchanged in size and contour. Right IJ sheath unchanged. Endotracheal tube sutured Lea position above the carina, terminating 4.2 cm above carina. Gastric tube projects over the mediastinum terminating out of the field of view. Surgical staples projecting over the bilateral upper chest as well as the upper midline abdomen. Improving aeration at the right lung base, with decreased AC opacity. No pneumothorax. Fine reticulations throughout the lungs, similar to the comparison with no significant interlobular septal thickening. IMPRESSION: Improving aeration of the lungs compared to the prior, with no visualized pneumothorax. Unchanged  endotracheal tube, gastric tube, right IJ sheath. Surgical changes of the bilateral upper chest and the midline abdomen. Electronically Signed   By: Corrie Mckusick D.O.   On: 10/04/2020 09:06   US Abdomen Limited RUQ (LIVER/GB)  Result Date: 10/05/2020 CLINICAL DATA:  Transaminitis EXAM: ULTRASOUND ABDOMEN LIMITED RIGHT UPPER QUADRANT COMPARISON:  CT abdomen and pelvis dated October 30, 2012 FINDINGS: Gallbladder: Gallbladder is surgically absent. Common bile duct: Diameter: 7 mm Liver: No focal lesion identified. Heterogeneous echotexture. Portal vein is patent on color Doppler imaging with normal direction of blood flow towards the liver. Other: Right pleural effusion. IMPRESSION: Mildly dilated common bile duct which can be seen status post cholecystectomy. Small right pleural effusion. Electronically Signed   By: Yetta Glassman M.D.   On: 10/05/2020 11:04    Review of Systems  Unable to perform ROS: Mental status change  Blood pressure 113/67, pulse 84, temperature 99.5 F (37.5 C), temperature source  Oral, resp. rate 14, height 4' 11.02" (1.499 m), weight 50.2 kg, SpO2 99 %. Physical Exam Vitals reviewed.  Constitutional:      Appearance: She is obese. She is ill-appearing. She is not toxic-appearing.  HENT:     Head: Normocephalic and atraumatic.     Right Ear: External ear normal.     Left Ear: External ear normal.     Nose: Nose normal.     Comments: NGT in situ    Mouth/Throat:     Mouth: Mucous membranes are dry.  Eyes:     General: No scleral icterus.    Conjunctiva/sclera: Conjunctivae normal.  Cardiovascular:     Rate and Rhythm: Normal rate and regular rhythm.     Heart sounds: Normal heart sounds.    No gallop.  Pulmonary:     Breath sounds: Normal breath sounds. No wheezing or rales.  Abdominal:     General: There is distension.     Palpations: Abdomen is soft.     Comments: Distention/obesity.  Intact staples from recent surgical sites  Musculoskeletal:      Cervical back: Normal range of motion and neck supple.     Right lower leg: Edema present.     Left lower leg: Edema present.     Comments: 1-2+ upper and lower extremity edema  Skin:    General: Skin is warm and dry.   Assessment/Plan: 1.  Acute kidney injury: Timeline of events and available data points largely to ischemic ATN with major vascular surgery required as a lifesaving procedure.  She is anuric at this time but without any compelling indications for renal replacement therapy.  I have discussed with critical care service to electively place a dialysis catheter anticipating that we will need renal replacement therapy within the next 24 hours or so.  She had unilateral ureteral repair/stenting which decreases the likelihood for continued urine leak postoperatively that could be contributing to her rising creatinine/poor urine output.Avoid nephrotoxic medications including NSAIDs and iodinated intravenous contrast exposure unless the latter is absolutely indicated.  Preferred narcotic agents for pain control are hydromorphone, fentanyl, and methadone. Morphine should not be used. Avoid Baclofen and avoid oral sodium phosphate and magnesium citrate based laxatives / bowel preps. Continue strict Input and Output monitoring. Will monitor the patient closely with you and intervene or adjust therapy as indicated by changes in clinical status/labs. 2.  Removal of aortobifemoral bypass graft with revascularization surgery of lower extremities complicated by serosal duodenal injury and right ureteral transection.  Hemodynamically stable from vascular surgery standpoint and plans noted for management of ureteral stent.  Indwelling Foley catheter in place. 3.  Hypoxic respiratory failure: Status postextubation and able to maintain decent oxygenation with supplementation via nasal cannula. 4.  Anemia: Secondary to acute blood loss/vascular surgery.  Continue to monitor for need to replace with  PRBCs.   Julena Barbour K. 10/05/2020, 12:03 PM

## 2020-10-06 DIAGNOSIS — J9601 Acute respiratory failure with hypoxia: Secondary | ICD-10-CM | POA: Diagnosis not present

## 2020-10-06 LAB — MAGNESIUM: Magnesium: 2.4 mg/dL (ref 1.7–2.4)

## 2020-10-06 LAB — POCT I-STAT 7, (LYTES, BLD GAS, ICA,H+H)
Acid-base deficit: 1 mmol/L (ref 0.0–2.0)
Acid-base deficit: 1 mmol/L (ref 0.0–2.0)
Bicarbonate: 25.5 mmol/L (ref 20.0–28.0)
Bicarbonate: 26.3 mmol/L (ref 20.0–28.0)
Calcium, Ion: 0.97 mmol/L — ABNORMAL LOW (ref 1.15–1.40)
Calcium, Ion: 1.02 mmol/L — ABNORMAL LOW (ref 1.15–1.40)
HCT: 23 % — ABNORMAL LOW (ref 36.0–46.0)
HCT: 28 % — ABNORMAL LOW (ref 36.0–46.0)
Hemoglobin: 7.8 g/dL — ABNORMAL LOW (ref 12.0–15.0)
Hemoglobin: 9.5 g/dL — ABNORMAL LOW (ref 12.0–15.0)
O2 Saturation: 76 %
O2 Saturation: 90 %
Patient temperature: 96.1
Potassium: 4.4 mmol/L (ref 3.5–5.1)
Potassium: 4.5 mmol/L (ref 3.5–5.1)
Sodium: 139 mmol/L (ref 135–145)
Sodium: 138 mmol/L (ref 135–145)
TCO2: 27 mmol/L (ref 22–32)
TCO2: 28 mmol/L (ref 22–32)
pCO2 arterial: 51.5 mmHg — ABNORMAL HIGH (ref 32.0–48.0)
pCO2 arterial: 53.2 mmHg — ABNORMAL HIGH (ref 32.0–48.0)
pH, Arterial: 7.296 — ABNORMAL LOW (ref 7.350–7.450)
pH, Arterial: 7.302 — ABNORMAL LOW (ref 7.350–7.450)
pO2, Arterial: 42 mmHg — ABNORMAL LOW (ref 83.0–108.0)
pO2, Arterial: 67 mmHg — ABNORMAL LOW (ref 83.0–108.0)

## 2020-10-06 LAB — GLUCOSE, CAPILLARY
Glucose-Capillary: 102 mg/dL — ABNORMAL HIGH (ref 70–99)
Glucose-Capillary: 116 mg/dL — ABNORMAL HIGH (ref 70–99)
Glucose-Capillary: 120 mg/dL — ABNORMAL HIGH (ref 70–99)
Glucose-Capillary: 123 mg/dL — ABNORMAL HIGH (ref 70–99)
Glucose-Capillary: 125 mg/dL — ABNORMAL HIGH (ref 70–99)
Glucose-Capillary: 130 mg/dL — ABNORMAL HIGH (ref 70–99)
Glucose-Capillary: 130 mg/dL — ABNORMAL HIGH (ref 70–99)
Glucose-Capillary: 133 mg/dL — ABNORMAL HIGH (ref 70–99)
Glucose-Capillary: 166 mg/dL — ABNORMAL HIGH (ref 70–99)

## 2020-10-06 LAB — HEPATIC FUNCTION PANEL
ALT: 715 U/L — ABNORMAL HIGH (ref 0–44)
AST: 358 U/L — ABNORMAL HIGH (ref 15–41)
Albumin: 1.5 g/dL — ABNORMAL LOW (ref 3.5–5.0)
Alkaline Phosphatase: 171 U/L — ABNORMAL HIGH (ref 38–126)
Bilirubin, Direct: 0.7 mg/dL — ABNORMAL HIGH (ref 0.0–0.2)
Indirect Bilirubin: 0.8 mg/dL (ref 0.3–0.9)
Total Bilirubin: 1.5 mg/dL — ABNORMAL HIGH (ref 0.3–1.2)
Total Protein: 3.9 g/dL — ABNORMAL LOW (ref 6.5–8.1)

## 2020-10-06 LAB — TRIGLYCERIDES: Triglycerides: 186 mg/dL — ABNORMAL HIGH (ref <150)

## 2020-10-06 LAB — RENAL FUNCTION PANEL
Albumin: 1.6 g/dL — ABNORMAL LOW (ref 3.5–5.0)
Anion gap: 11 (ref 5–15)
BUN: 38 mg/dL — ABNORMAL HIGH (ref 8–23)
CO2: 23 mmol/L (ref 22–32)
Calcium: 7.3 mg/dL — ABNORMAL LOW (ref 8.9–10.3)
Chloride: 105 mmol/L (ref 98–111)
Creatinine, Ser: 2.96 mg/dL — ABNORMAL HIGH (ref 0.44–1.00)
GFR, Estimated: 16 mL/min — ABNORMAL LOW (ref >60)
Glucose, Bld: 131 mg/dL — ABNORMAL HIGH (ref 70–99)
Phosphorus: 3.2 mg/dL (ref 2.5–4.6)
Potassium: 4.4 mmol/L (ref 3.5–5.1)
Sodium: 139 mmol/L (ref 135–145)

## 2020-10-06 LAB — BASIC METABOLIC PANEL
Anion gap: 11 (ref 5–15)
BUN: 51 mg/dL — ABNORMAL HIGH (ref 8–23)
CO2: 24 mmol/L (ref 22–32)
Calcium: 7.2 mg/dL — ABNORMAL LOW (ref 8.9–10.3)
Chloride: 105 mmol/L (ref 98–111)
Creatinine, Ser: 4.26 mg/dL — ABNORMAL HIGH (ref 0.44–1.00)
GFR, Estimated: 10 mL/min — ABNORMAL LOW (ref >60)
Glucose, Bld: 136 mg/dL — ABNORMAL HIGH (ref 70–99)
Potassium: 4.5 mmol/L (ref 3.5–5.1)
Sodium: 140 mmol/L (ref 135–145)

## 2020-10-06 LAB — CBC
HCT: 26.7 % — ABNORMAL LOW (ref 36.0–46.0)
HCT: 28.8 % — ABNORMAL LOW (ref 36.0–46.0)
Hemoglobin: 8.8 g/dL — ABNORMAL LOW (ref 12.0–15.0)
Hemoglobin: 9.3 g/dL — ABNORMAL LOW (ref 12.0–15.0)
MCH: 30.2 pg (ref 26.0–34.0)
MCH: 29.2 pg (ref 26.0–34.0)
MCHC: 33 g/dL (ref 30.0–36.0)
MCHC: 32.3 g/dL (ref 30.0–36.0)
MCV: 91.8 fL (ref 80.0–100.0)
MCV: 90.6 fL (ref 80.0–100.0)
Platelets: 120 10*3/uL — ABNORMAL LOW (ref 150–400)
Platelets: 113 10*3/uL — ABNORMAL LOW (ref 150–400)
RBC: 2.91 MIL/uL — ABNORMAL LOW (ref 3.87–5.11)
RBC: 3.18 MIL/uL — ABNORMAL LOW (ref 3.87–5.11)
RDW: 17 % — ABNORMAL HIGH (ref 11.5–15.5)
RDW: 16.9 % — ABNORMAL HIGH (ref 11.5–15.5)
WBC: 28 10*3/uL — ABNORMAL HIGH (ref 4.0–10.5)
WBC: 28.3 10*3/uL — ABNORMAL HIGH (ref 4.0–10.5)
nRBC: 0.2 % (ref 0.0–0.2)
nRBC: 0.2 % (ref 0.0–0.2)

## 2020-10-06 LAB — CULTURE, RESPIRATORY W GRAM STAIN: Gram Stain: NONE SEEN

## 2020-10-06 LAB — PHOSPHORUS: Phosphorus: 5.2 mg/dL — ABNORMAL HIGH (ref 2.5–4.6)

## 2020-10-06 LAB — PROTIME-INR
INR: 1.4 — ABNORMAL HIGH (ref 0.8–1.2)
Prothrombin Time: 17.4 seconds — ABNORMAL HIGH (ref 11.4–15.2)

## 2020-10-06 LAB — AMMONIA: Ammonia: 29 umol/L (ref 9–35)

## 2020-10-06 MED ORDER — TRACE MINERALS CU-MN-SE-ZN 300-55-60-3000 MCG/ML IV SOLN
INTRAVENOUS | Status: AC
  Filled 2020-10-06: qty 598.4

## 2020-10-06 MED ORDER — SODIUM CHLORIDE 0.9 % IV SOLN
2.0000 g | Freq: Two times a day (BID) | INTRAVENOUS | Status: DC
  Administered 2020-10-06: 2 g via INTRAVENOUS

## 2020-10-06 MED ORDER — HEPARIN SODIUM (PORCINE) 1000 UNIT/ML DIALYSIS: 1000.0000 [IU] | INTRAMUSCULAR | Status: DC | PRN

## 2020-10-06 MED ORDER — AMIODARONE HCL IN DEXTROSE 360-4.14 MG/200ML-% IV SOLN
INTRAVENOUS | Status: AC
  Filled 2020-10-06: qty 200

## 2020-10-06 MED ORDER — HEPARIN (PORCINE) 2000 UNITS/L FOR CRRT: INTRAVENOUS_CENTRAL | Status: DC | PRN

## 2020-10-06 MED ORDER — SODIUM CHLORIDE 0.9 % IV SOLN
2.0000 g | Freq: Two times a day (BID) | INTRAVENOUS | Status: DC
  Administered 2020-10-06 – 2020-10-07 (×3): 2 g via INTRAVENOUS
  Filled 2020-10-06 (×3): qty 2

## 2020-10-06 MED ORDER — AMIODARONE HCL IN DEXTROSE 360-4.14 MG/200ML-% IV SOLN
30.0000 mg/h | INTRAVENOUS | Status: DC
  Administered 2020-10-07 – 2020-10-08 (×4): 30 mg/h via INTRAVENOUS
  Filled 2020-10-06 (×3): qty 200

## 2020-10-06 MED ORDER — METRONIDAZOLE 500 MG/100ML IV SOLN
500.0000 mg | Freq: Two times a day (BID) | INTRAVENOUS | Status: DC
  Administered 2020-10-06 – 2020-10-08 (×4): 500 mg via INTRAVENOUS
  Filled 2020-10-06 (×4): qty 100

## 2020-10-06 MED ORDER — PRISMASOL BGK 4/2.5 32-4-2.5 MEQ/L REPLACEMENT SOLN: Status: DC

## 2020-10-06 MED ORDER — PRISMASOL BGK 4/2.5 32-4-2.5 MEQ/L EC SOLN: Status: DC

## 2020-10-06 MED ORDER — AMIODARONE LOAD VIA INFUSION
150.0000 mg | Freq: Once | INTRAVENOUS | Status: AC
  Administered 2020-10-06: 150 mg via INTRAVENOUS
  Filled 2020-10-06: qty 83.34

## 2020-10-06 MED ORDER — AMIODARONE HCL IN DEXTROSE 360-4.14 MG/200ML-% IV SOLN
60.0000 mg/h | INTRAVENOUS | Status: AC
  Filled 2020-10-06: qty 200

## 2020-10-06 MED ORDER — ALBUMIN HUMAN 25 % IV SOLN: 12.5000 g | Freq: Once | INTRAVENOUS | Status: DC

## 2020-10-06 MED ORDER — VASOPRESSIN 20 UNITS/100 ML INFUSION FOR SHOCK
0.0000 [IU]/min | INTRAVENOUS | Status: DC
  Administered 2020-10-06: 0.03 [IU]/min via INTRAVENOUS
  Filled 2020-10-06: qty 100

## 2020-10-06 NOTE — Progress Notes (Signed)
  Amiodarone Drug - Drug Interaction Consult Note  Recommendations: No changes needed  Amiodarone is metabolized by the cytochrome P450 system and therefore has the potential to cause many drug interactions. Amiodarone has an average plasma half-life of 50 days (range 20 to 100 days).   There is potential for drug interactions to occur several weeks or months after stopping treatment and the onset of drug interactions may be slow after initiating amiodarone.   '[]'$  Statins: Increased risk of myopathy. Simvastatin- restrict dose to '20mg'$  daily. Other statins: counsel patients to report any muscle pain or weakness immediately.  '[]'$  Anticoagulants: Amiodarone can increase anticoagulant effect. Consider warfarin dose reduction. Patients should be monitored closely and the dose of anticoagulant altered accordingly, remembering that amiodarone levels take several weeks to stabilize.  '[]'$  Antiepileptics: Amiodarone can increase plasma concentration of phenytoin, the dose should be reduced. Note that small changes in phenytoin dose can result in large changes in levels. Monitor patient and counsel on signs of toxicity.  '[]'$  Beta blockers: increased risk of bradycardia, AV block and myocardial depression. Sotalol - avoid concomitant use.  '[]'$   Calcium channel blockers (diltiazem and verapamil): increased risk of bradycardia, AV block and myocardial depression.  '[]'$   Cyclosporine: Amiodarone increases levels of cyclosporine. Reduced dose of cyclosporine is recommended.  '[]'$  Digoxin dose should be halved when amiodarone is started.  '[]'$  Diuretics: increased risk of cardiotoxicity if hypokalemia occurs.  '[]'$  Oral hypoglycemic agents (glyburide, glipizide, glimepiride): increased risk of hypoglycemia. Patient's glucose levels should be monitored closely when initiating amiodarone therapy.   '[]'$  Drugs that prolong the QT interval:  Torsades de pointes risk may be increased with concurrent use - avoid if possible.   Monitor QTc, also keep magnesium/potassium WNL if concurrent therapy can't be avoided.  Antibiotics: e.g. fluoroquinolones, erythromycin.  Antiarrhythmics: e.g. quinidine, procainamide, disopyramide, sotalol.  Antipsychotics: e.g. phenothiazines, haloperidol.   Lithium, tricyclic antidepressants, and methadone.   Bonnita Nasuti Pharm.D. CPP, BCPS Clinical Pharmacist [phone] 10/06/2020 9:54 PM

## 2020-10-06 NOTE — Progress Notes (Signed)
eLink Physician-Brief Progress Note Patient Name: [name] DOB: 03-03-1945 MRN: [id number]   Date of Service  10/06/2020  HPI/Events of Note  MAP now 76.  eICU Interventions  Vasopressin and 25 % Albumin orders discontinued.        Kerry Kass Daysen Gundrum 10/06/2020, 11:27 PM

## 2020-10-06 NOTE — Progress Notes (Signed)
PHARMACY NOTE:  ANTIMICROBIAL RENAL DOSAGE ADJUSTMENT  Current antimicrobial regimen includes a mismatch between antimicrobial dosage and estimated renal function.  As per policy approved by the Pharmacy & Therapeutics and Medical Executive Committees, the antimicrobial dosage will be adjusted accordingly.  Current antimicrobial dosage:  Cefepime 2g IV every 24 hours  Indication: Pneumonia   Renal Function:  Estimated Creatinine Clearance: 7.7 mL/min (A) (by C-G formula based on SCr of 4.26 mg/dL (H)). '[]'$      On intermittent HD, scheduled: '[x]'$      On CRRT    Antimicrobial dosage has been changed to:  Cefepime 2g IV every 12 hours  Additional comments:   Thank you for allowing pharmacy to be a part of this patient's care.  Antonietta Jewel, PharmD, [place] Clinical Pharmacist  Phone: [phone] 10/06/2020 9:50 AM  Please check AMION for all [facility] phone numbers After 10:00 PM, call [place] [phone]

## 2020-10-06 NOTE — Progress Notes (Addendum)
[facility] for Infectious Disease    Date of Admission:  10/11/2020   Total days of antibiotics 8           ID: [name] is a 75 y.o. 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), initial encounter ([place])    Subjective: Placed on CRRT;less pressor needs however WBC is trending up to 28K/LFT trending down; 133m in jp per shift  Medications:   sodium chloride   Intravenous Once   arformoterol  15 mcg Nebulization BID   budesonide (PULMICORT) nebulizer solution  0.25 mg Nebulization BID   chlorhexidine  15 mL Mouth Rinse BID   Chlorhexidine Gluconate Cloth  6 each Topical Daily   heparin injection (subcutaneous)  5,000 Units Subcutaneous Q8H   insulin aspart  0-9 Units Subcutaneous Q4H   levothyroxine  50 mcg Intravenous Daily   mouth rinse  15 mL Mouth Rinse q12n4p   pantoprazole (PROTONIX) IV  40 mg Intravenous QHS   sodium chloride flush  10-40 mL Intracatheter Q12H    Objective: Vital signs in last 24 hours: Temp:  [97.7 F (36.5 C)-98.8 F (37.1 C)] 97.7 F (36.5 C) (08/16 1300) Pulse Rate:  [64-88] 78 (08/16 1500) Resp:  [11-20] 16 (08/16 1500) BP: (101-139)/(50-73) 101/50 (08/16 1400) SpO2:  [80 %-99 %] 96 % (08/16 1500) Arterial Line BP: (94-142)/(42-65) 101/44 (08/16 1500)  Physical Exam  Constitutional:  encephalopathic; wearing gloves; appears well-developed and well-nourished. No distress.  HENT: [place]/AT, PERRLA, no scleral icterus Mouth/Throat: Oropharynx is clear and moist. No oropharyngeal exudate.  Cardiovascular: Normal rate, regular rhythm and normal heart sounds. Exam reveals no gallop and no friction rub.  No  murmur heard.  Pulmonary/Chest: Effort normal and breath sounds normal. No respiratory distress.  has no wheezes.  Neck = supple, no nuchal rigidity; bilateral central lines Abdominal: Soft. Bowel sounds are decreased. 2 jp drain with serosangionus fluid. Staples in place, c/d/i Lymphadenopathy: no cervical adenopathy. No axillary adenopathy Skin: Skin is warm and dry. No rash noted. No erythema.  Psychiatric: still having confusion   Lab Results Recent Labs    10/05/20 1629 10/06/20 0451  WBC 24.7* 28.0*  HGB 8.9* 8.8*  HCT 28.4* 26.7*  NA 139 140  K 4.3 4.5  CL 103 105  CO2 24 24  BUN 45* 51*  CREATININE 3.81* 4.26*   Liver Panel Recent Labs    10/05/20 0348 10/05/20 1629 10/06/20 0759  PROT 4.0*  --  3.9*  ALBUMIN 1.7* 1.5* 1.5*  AST 854*  --  358*  ALT 1,117*  --  715*  ALKPHOS 113  --  171*  BILITOT 0.9  --  1.5*  BILIDIR  --   --  0.7*  IBILI  --   --  0.8     Microbiology: C.glabrata in trach aspirate Studies/Results: DG Chest Port 1 View  Result Date: 10/05/2020 CLINICAL DATA:  Post central line placement EXAM: PORTABLE CHEST 1 VIEW COMPARISON:  10/04/2020 FINDINGS: New left IJ double-lumen line tip overlies central SVC. Right IJ central line overlies SVC. Enteric tube passes below the diaphragm with tip out of field of view. Endotracheal tube is no longer  present. Minor bibasilar atelectasis. No pneumothorax. Stable mild interstitial prominence. Stable cardiomediastinal contours. IMPRESSION: New left IJ central line tip overlies SVC.  No pneumothorax. Mild bibasilar atelectasis. Electronically Signed   By: Macy Mis M.D.   On: 10/05/2020 15:26   ECHOCARDIOGRAM COMPLETE  Result Date: 10/05/2020    ECHOCARDIOGRAM REPORT   Patient Name:   [name] Date of Exam: 10/05/2020 Medical Rec #:  [id number]      Height:       59.0 in Accession #:    [id number]     Weight:       110.7 lb Date of Birth:  [birth year]     BSA:          1.434 m Patient Age:    75  years       BP:           131/62 mmHg Patient Gender: F              HR:           71 bpm. Exam Location:  Inpatient Procedure: 2D Echo, Color Doppler and Cardiac Doppler Indications:    I42.80 Non-ischemic cardiomyopathy  History:        Patient has no prior history of Echocardiogram examinations.                 COPD; Risk Factors:Dyslipidemia and Hypertension.  Sonographer:    Raquel Sarna Senior RDCS Referring Phys: [id number] [place]  1. Left ventricular ejection fraction, by estimation, is 60 to 65%. The left ventricle has normal function. The left ventricle has no regional wall motion abnormalities. Left ventricular diastolic parameters are consistent with Grade II diastolic dysfunction (pseudonormalization).  2. Right ventricular systolic function is normal. The right ventricular size is normal. Tricuspid regurgitation signal is inadequate for assessing PA pressure.  3. Left atrial size was mildly dilated.  4. The mitral valve is normal in structure. No evidence of mitral valve regurgitation. No evidence of mitral stenosis. The mean mitral valve gradient is 4.0 mmHg with average heart rate of 76 bpm.  5. The aortic valve was not well visualized. Aortic valve regurgitation is mild. Mild to moderate aortic valve sclerosis/calcification is present, without any evidence of aortic stenosis.  6. The inferior vena cava is normal in size with greater than 50% respiratory variability, suggesting right atrial pressure of 3 mmHg.  7. Increased pulse wave Spectral Doppler near the takeoff of the left common carotid. Consider dedicated study for assessment of carotid artery stenosis if clinically indicated. Comparison(s): No prior Echocardiogram. FINDINGS  Left Ventricle: Left ventricular ejection fraction, by estimation, is 60 to 65%. The left ventricle has normal function. The left ventricle has no regional wall motion abnormalities. The left ventricular internal cavity size was small. There is no left  ventricular hypertrophy of the basal-septal segment. Left ventricular diastolic parameters are consistent with Grade II diastolic dysfunction (pseudonormalization). Right Ventricle: The right ventricular size is normal. No increase in right ventricular wall thickness. Right ventricular systolic function is normal. Tricuspid regurgitation signal is inadequate for assessing PA pressure. Left Atrium: Left atrial size was mildly dilated. Right Atrium: Right atrial size was normal in size. Pericardium: There is no evidence of pericardial effusion. Mitral Valve: The mitral valve is normal in structure. No evidence of mitral valve regurgitation. No evidence of mitral valve stenosis. The mean mitral valve gradient is 4.0 mmHg with average heart rate of 76 bpm. Tricuspid Valve: The tricuspid valve is normal  in structure. Tricuspid valve regurgitation is trivial. No evidence of tricuspid stenosis. Aortic Valve: The aortic valve was not well visualized. Aortic valve regurgitation is mild. Mild to moderate aortic valve sclerosis/calcification is present, without any evidence of aortic stenosis. Aortic valve mean gradient measures 8.0 mmHg. Aortic valve peak gradient measures 19.7 mmHg. Aortic valve area, by VTI measures 2.26 cm. Pulmonic Valve: The pulmonic valve was grossly normal. Pulmonic valve regurgitation is not visualized. No evidence of pulmonic stenosis. Aorta: The aortic root is normal in size and structure. Venous: The inferior vena cava is normal in size with greater than 50% respiratory variability, suggesting right atrial pressure of 3 mmHg. IAS/Shunts: The atrial septum is grossly normal.  LEFT VENTRICLE PLAX 2D LVIDd:         3.70 cm  Diastology LVIDs:         2.40 cm  LV e' medial:    6.74 cm/s LV PW:         0.80 cm  LV E/e' medial:  18.2 LV IVS:        1.10 cm  LV e' lateral:   8.49 cm/s LVOT diam:     1.90 cm  LV E/e' lateral: 14.5 LV SV:         95 LV SV Index:   66 LVOT Area:     2.84 cm  RIGHT VENTRICLE  RV S prime:     15.90 cm/s TAPSE (M-mode): 2.2 cm LEFT ATRIUM             Index       RIGHT ATRIUM           Index LA diam:        2.70 cm 1.88 cm/m  RA Area:     12.80 cm LA Vol (A2C):   58.7 ml 40.93 ml/m RA Volume:   30.80 ml  21.47 ml/m LA Vol (A4C):   42.8 ml 29.84 ml/m LA Biplane Vol: 50.3 ml 35.07 ml/m  AORTIC VALVE AV Area (Vmax):    2.21 cm AV Area (Vmean):   2.39 cm AV Area (VTI):     2.26 cm AV Vmax:           222.00 cm/s AV Vmean:          127.000 cm/s AV VTI:            0.421 m AV Peak Grad:      19.7 mmHg AV Mean Grad:      8.0 mmHg LVOT Vmax:         173.00 cm/s LVOT Vmean:        107.000 cm/s LVOT VTI:          0.335 m LVOT/AV VTI ratio: 0.80  AORTA Ao Root diam: 3.10 cm MITRAL VALVE MV Area (PHT): 3.27 cm     SHUNTS MV Mean grad:  4.0 mmHg     Systemic VTI:  0.34 m MV Decel Time: 232 msec     Systemic Diam: 1.90 cm MV E velocity: 123.00 cm/s MV A velocity: 145.00 cm/s MV E/A ratio:  0.85 Rudean Haskell MD Electronically signed by Rudean Haskell MD Signature Date/Time: 10/05/2020/4:08:43 PM    Final    US Abdomen Limited RUQ (LIVER/GB)  Result Date: 10/05/2020 CLINICAL DATA:  Transaminitis EXAM: ULTRASOUND ABDOMEN LIMITED RIGHT UPPER QUADRANT COMPARISON:  CT abdomen and pelvis dated October 30, 2012 FINDINGS: Gallbladder: Gallbladder is surgically absent. Common bile duct: Diameter: 7 mm Liver: No focal lesion identified. Heterogeneous echotexture.  Portal vein is patent on color Doppler imaging with normal direction of blood flow towards the liver. Other: Right pleural effusion. IMPRESSION: Mildly dilated common bile duct which can be seen status post cholecystectomy. Small right pleural effusion. Electronically Signed   By: Yetta Glassman M.D.   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 Continue on cefepime and metronidazole. If she worsens.Will follow WBC. Cultures still no growth to date  from 8/12.  C.glabrata in trach culture = not thought to have pneumonia. Will not plan to start antifungals  Transaminitis = slowly trending downward  [facility] Cell: [phone] Pager: [phone]  10/06/2020, 3:52 PM

## 2020-10-06 NOTE — Progress Notes (Signed)
4 Days Post-Op   Subjective/Chief Complaint: Laying in bed with some confusion this am per RN.  Getting ready to start CRRT.    Objective: Vital signs in last 24 hours: Temp:  [97.8 F (36.6 C)-99.5 F (37.5 C)] 98.1 F (36.7 C) (08/16 0300) Pulse Rate:  [64-87] 80 (08/16 0700) Resp:  [9-19] 14 (08/16 0700) BP: (102-140)/(51-73) 121/59 (08/16 0600) SpO2:  [82 %-100 %] 97 % (08/16 0700) Arterial Line BP: (92-138)/(40-65) 97/43 (08/16 0700) Last BM Date: 10/04/20  Intake/Output from previous day: 08/15 0701 - 08/16 0700 In: 1901.2 [I.V.:1601.2; IV Piggyback:300] Out: 515 [Urine:25; Emesis/NG output:250; Drains:240] Intake/Output this shift: No intake/output data recorded.  Abd: inc c/d/I, JP SS, doesn't seem tender, but is sleepy  Lab Results:  Recent Labs    10/05/20 1629 10/06/20 0451  WBC 24.7* 28.0*  HGB 8.9* 8.8*  HCT 28.4* 26.7*  PLT 113* 120*   BMET Recent Labs    10/05/20 1629 10/06/20 0451  NA 139 140  K 4.3 4.5  CL 103 105  CO2 24 24  GLUCOSE 131* 136*  BUN 45* 51*  CREATININE 3.81* 4.26*  CALCIUM 7.0* 7.2*   PT/INR Recent Labs    10/05/20 0950 10/06/20 0451  LABPROT 16.8* 17.4*  INR 1.4* 1.4*   ABG Recent Labs    10/03/20 0952 10/05/20 0757  PHART 7.369 7.310*  HCO3 22.4 27.7    Studies/Results: DG Chest Port 1 View  Result Date: 10/05/2020 CLINICAL DATA:  Post central line placement EXAM: PORTABLE CHEST 1 VIEW COMPARISON:  10/04/2020 FINDINGS: New left IJ double-lumen line tip overlies central SVC. Right IJ central line overlies SVC. Enteric tube passes below the diaphragm with tip out of field of view. Endotracheal tube is no longer present. Minor bibasilar atelectasis. No pneumothorax. Stable mild interstitial prominence. Stable cardiomediastinal contours. IMPRESSION: New left IJ central line tip overlies SVC.  No pneumothorax. Mild bibasilar atelectasis. Electronically Signed   By: Macy Mis M.D.   On: 10/05/2020 15:26    ECHOCARDIOGRAM COMPLETE  Result Date: 10/05/2020    ECHOCARDIOGRAM REPORT   Patient Name:   [name] Date of Exam: 10/05/2020 Medical Rec #:  [id number]      Height:       59.0 in Accession #:    [id number]     Weight:       110.7 lb Date of Birth:  03-05-1945     BSA:          1.434 m Patient Age:    75 years       BP:           131/62 mmHg Patient Gender: F              HR:           71 bpm. Exam Location:  Inpatient Procedure: 2D Echo, Color Doppler and Cardiac Doppler Indications:    I42.80 Non-ischemic cardiomyopathy  History:        Patient has no prior history of Echocardiogram examinations.                 COPD; Risk Factors:Dyslipidemia and Hypertension.  Sonographer:    Raquel Sarna Senior RDCS Referring Phys: [id number] [place]  1. Left ventricular ejection fraction, by estimation, is 60 to 65%. The left ventricle has normal function. The left ventricle has no regional wall motion abnormalities. Left ventricular diastolic parameters are consistent with Grade II diastolic dysfunction (pseudonormalization).  2. Right ventricular systolic  function is normal. The right ventricular size is normal. Tricuspid regurgitation signal is inadequate for assessing PA pressure.  3. Left atrial size was mildly dilated.  4. The mitral valve is normal in structure. No evidence of mitral valve regurgitation. No evidence of mitral stenosis. The mean mitral valve gradient is 4.0 mmHg with average heart rate of 76 bpm.  5. The aortic valve was not well visualized. Aortic valve regurgitation is mild. Mild to moderate aortic valve sclerosis/calcification is present, without any evidence of aortic stenosis.  6. The inferior vena cava is normal in size with greater than 50% respiratory variability, suggesting right atrial pressure of 3 mmHg.  7. Increased pulse wave Spectral Doppler near the takeoff of the left common carotid. Consider dedicated study for assessment of carotid artery stenosis if clinically  indicated. Comparison(s): No prior Echocardiogram. FINDINGS  Left Ventricle: Left ventricular ejection fraction, by estimation, is 60 to 65%. The left ventricle has normal function. The left ventricle has no regional wall motion abnormalities. The left ventricular internal cavity size was small. There is no left ventricular hypertrophy of the basal-septal segment. Left ventricular diastolic parameters are consistent with Grade II diastolic dysfunction (pseudonormalization). Right Ventricle: The right ventricular size is normal. No increase in right ventricular wall thickness. Right ventricular systolic function is normal. Tricuspid regurgitation signal is inadequate for assessing PA pressure. Left Atrium: Left atrial size was mildly dilated. Right Atrium: Right atrial size was normal in size. Pericardium: There is no evidence of pericardial effusion. Mitral Valve: The mitral valve is normal in structure. No evidence of mitral valve regurgitation. No evidence of mitral valve stenosis. The mean mitral valve gradient is 4.0 mmHg with average heart rate of 76 bpm. Tricuspid Valve: The tricuspid valve is normal in structure. Tricuspid valve regurgitation is trivial. No evidence of tricuspid stenosis. Aortic Valve: The aortic valve was not well visualized. Aortic valve regurgitation is mild. Mild to moderate aortic valve sclerosis/calcification is present, without any evidence of aortic stenosis. Aortic valve mean gradient measures 8.0 mmHg. Aortic valve peak gradient measures 19.7 mmHg. Aortic valve area, by VTI measures 2.26 cm. Pulmonic Valve: The pulmonic valve was grossly normal. Pulmonic valve regurgitation is not visualized. No evidence of pulmonic stenosis. Aorta: The aortic root is normal in size and structure. Venous: The inferior vena cava is normal in size with greater than 50% respiratory variability, suggesting right atrial pressure of 3 mmHg. IAS/Shunts: The atrial septum is grossly normal.  LEFT VENTRICLE  PLAX 2D LVIDd:         3.70 cm  Diastology LVIDs:         2.40 cm  LV e' medial:    6.74 cm/s LV PW:         0.80 cm  LV E/e' medial:  18.2 LV IVS:        1.10 cm  LV e' lateral:   8.49 cm/s LVOT diam:     1.90 cm  LV E/e' lateral: 14.5 LV SV:         95 LV SV Index:   66 LVOT Area:     2.84 cm  RIGHT VENTRICLE RV S prime:     15.90 cm/s TAPSE (M-mode): 2.2 cm LEFT ATRIUM             Index       RIGHT ATRIUM           Index LA diam:        2.70 cm 1.88 cm/m  RA  Area:     12.80 cm LA Vol (A2C):   58.7 ml 40.93 ml/m RA Volume:   30.80 ml  21.47 ml/m LA Vol (A4C):   42.8 ml 29.84 ml/m LA Biplane Vol: 50.3 ml 35.07 ml/m  AORTIC VALVE AV Area (Vmax):    2.21 cm AV Area (Vmean):   2.39 cm AV Area (VTI):     2.26 cm AV Vmax:           222.00 cm/s AV Vmean:          127.000 cm/s AV VTI:            0.421 m AV Peak Grad:      19.7 mmHg AV Mean Grad:      8.0 mmHg LVOT Vmax:         173.00 cm/s LVOT Vmean:        107.000 cm/s LVOT VTI:          0.335 m LVOT/AV VTI ratio: 0.80  AORTA Ao Root diam: 3.10 cm MITRAL VALVE MV Area (PHT): 3.27 cm     SHUNTS MV Mean grad:  4.0 mmHg     Systemic VTI:  0.34 m MV Decel Time: 232 msec     Systemic Diam: 1.90 cm MV E velocity: 123.00 cm/s MV A velocity: 145.00 cm/s MV E/A ratio:  0.85 Rudean Haskell MD Electronically signed by Rudean Haskell MD Signature Date/Time: 10/05/2020/4:08:43 PM    Final    US Abdomen Limited RUQ (LIVER/GB)  Result Date: 10/05/2020 CLINICAL DATA:  Transaminitis EXAM: ULTRASOUND ABDOMEN LIMITED RIGHT UPPER QUADRANT COMPARISON:  CT abdomen and pelvis dated October 30, 2012 FINDINGS: Gallbladder: Gallbladder is surgically absent. Common bile duct: Diameter: 7 mm Liver: No focal lesion identified. Heterogeneous echotexture. Portal vein is patent on color Doppler imaging with normal direction of blood flow towards the liver. Other: Right pleural effusion. IMPRESSION: Mildly dilated common bile duct which can be seen status post  cholecystectomy. Small right pleural effusion. Electronically Signed   By: Yetta Glassman M.D.   On: 10/05/2020 11:04    Anti-infectives: Anti-infectives (From admission, onward)    Start     Dose/Rate Route Frequency Ordered Stop   10/06/20 2000  DAPTOmycin (CUBICIN) 400 mg in sodium chloride 0.9 % IVPB        8 mg/kg  50.2 kg 116 mL/hr over 30 Minutes Intravenous Every 48 hours 10/05/20 0944     10/05/20 1045  metroNIDAZOLE (FLAGYL) IVPB 500 mg        500 mg 100 mL/hr over 60 Minutes Intravenous Every 12 hours 10/05/20 0946     10/04/20 2130  vancomycin (VANCOREADY) IVPB 750 mg/150 mL        750 mg 150 mL/hr over 60 Minutes Intravenous  Once 10/04/20 2041 10/04/20 2331   10/04/20 1021  vancomycin variable dose per unstable renal function (pharmacist dosing)  Status:  Discontinued         Does not apply See admin instructions 10/04/20 1021 10/05/20 0937   10/03/20 1000  ceFEPIme (MAXIPIME) 2 g in sodium chloride 0.9 % 100 mL IVPB        2 g 200 mL/hr over 30 Minutes Intravenous Every 24 hours 10/03/20 0831     10/01/20 0800  cefTRIAXone (ROCEPHIN) 2 g in sodium chloride 0.9 % 100 mL IVPB  Status:  Discontinued        2 g 200 mL/hr over 30 Minutes Intravenous Every 24 hours 10/01/20 0700 10/03/20 0754   09/30/20  1830  vancomycin (VANCOREADY) IVPB 500 mg/100 mL  Status:  Discontinued        500 mg 100 mL/hr over 60 Minutes Intravenous Every 24 hours 10/15/2020 2245 10/04/20 1021   10/04/2020 2345  ceFEPIme (MAXIPIME) 2 g in sodium chloride 0.9 % 100 mL IVPB  Status:  Discontinued        2 g 200 mL/hr over 30 Minutes Intravenous Every 12 hours 10/14/2020 2250 10/01/20 0653   10/21/2020 1731  vancomycin (VANCOCIN) 1-5 GM/200ML-% IVPB       Note to Pharmacy: Cameron Sprang   : cabinet override      09/28/2020 1731 09/30/20 0544       Assessment/Plan: POD 4 removal infected aortic graft, extraanatomic bypass, duodenal serosal tear -care per vascular surgery and ccm -drain near duodenum  without bile and con't to SS -continue ng tube -would avoid cortrak placement -con't TPN but if drains remains SS and abdomen still looks ok, can likely try to start TFs tomorrow.  LOS: 7 days    Henreitta Cea 10/06/2020

## 2020-10-06 NOTE — Progress Notes (Signed)
NAME:  [name], MRN:  [id number], DOB:  1945/08/30, LOS: 7 ADMISSION DATE:  09/30/2020, CONSULTATION DATE:  8/12 REFERRING MD:  Dr. Oneida Alar, CHIEF COMPLAINT:  removal of infected aorta bi-femoral bypass graft; placement of R axillay BI-FEMORAL BYPASS GRAFT and LEFT AXILLARY POPLITEAL BYPASS GRAFT   History of Present Illness:  Patient is a 75 yo F w/ pertinent PMH of carotid artery occlusion, CVA, hyper otitis, HTN, HLD, hypothyroidism, IBS, hydronephrosis, COPD, MI, PVD, PAF, aortobifemoral bypass graft 10 years ago presents to [facility] on 10/15/2020 with left femoral stent bleeding and infection.  Patient has been on Omadacycline for 2 days prior to admission for infection and has recently noticed profuse bleeding from the site. Has had I&D of left groin in December 2021. Bleeding was stopped and EMS called to bring to [facility] on 8/9. Last dose of Eliquis for PAF on 8/8. Hgb and bp stable. Vascular surgery consulted. On 8/9 Status postrepair of left femoral anastomosis with bovine pericardial patch for bleeding surgery performed. 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 Started on Vanc/cefepime. Infectious disease consulted 8/10. On Vanc and ceftriaxone.  On 8/12, Vascular surgery  performed: REMOVAL OF AORTA BI-FEMORAL BYPASS GRAFT, placed RIGTH AXILLARY BI-FEMORAL BYPASS GRAFT and LEFT AXILLARY POPLITEAL BYPASS GRAFT. There was small serosal injury to duodenum s/p procedure. General Surgery consulted. Patched with omentum and left NG tube with drain. Urology placed Right ureteroureterostomy with ureteral stent placement/exchange for complete mid ureteral transection. Patient arrived to ICU intubated.  PCCM consulted for management of mechanical ventilation and medical management.  Pertinent  Medical History   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   Cerebrovascular occlusion 2001   Extracranial with Hx of TIA's   Depression    Hepatitis     Hyperlipidemia    IBS (irritable bowel syndrome)    Myocardial infarction [facility])    Peripheral vascular disease (HCC)    had Carotid En   Rheumatic fever    Stroke ([place])    no residual- except left side of face has a small "twist"   Thyroid disease    Hypothyroidism     Significant Hospital Events: Including procedures, antibiotic start and stop dates in addition to other pertinent events   8/12: pccm consulted for management of mechanical ventilation s/p surgery placement of RIGHT axillay BI-FEMORAL BYPASS GRAFT and LEFT AXILLARY POPLITEAL BYPASS GRAFT  8/14 extubated  Interim History / Subjective:  Remains confused. Daughter at bedside. To start CRRT today.  Objective   Blood pressure (!) 127/53, pulse 75, temperature 98.1 F (36.7 C), temperature source Oral, resp. rate 16, height 4' 11.02" (1.499 m), weight 50.2 kg, SpO2 98 %. CVP:  [8 mmHg-9 mmHg] 8 mmHg      Intake/Output Summary (Last 24 hours) at 10/06/2020 0720 Last data filed at 10/06/2020 0543 Gross per 24 hour  Intake 1901.18 ml  Output 515 ml  Net 1386.18 ml    Filed Weights   10/21/2020 2230 09/30/20 0600 10/01/20 0500  Weight: 45.6 kg 48.2 kg 50.2 kg   UOP 83cc  Examination: Ill appearing woman writhing around in bed MM dry trachea midline Lungs fairly clear Incision sites look clean, dopplerable pulses BL JP drain x2 on R abdomen (one peri-duodenal, one peri-ureteral)  BUN/Cr up WBC up a bit more H/H stable Plts stable One sugar documented really low, think erroneous No new chest imaging  Resolved Hospital Problem list   Acute  respiratory failure with hypoxia s/p surgical procedure requiring mechanical ventilation Concern for RLL HAP  Assessment & Plan:  Aortobifemoral bypass graft infection, unknown organism Removal of infected aorta bi-fem bypass graft Right axillary bi-fem bypass graft Axillary popliteal bypass graft - abx per ID: dapto/metronidazole - postop wound care per CCS and  vascular - heparin dvt ppx for now, at some point if H/H stable need to rechallenge with [facility]: per primary  Small serosal injury to duodenum s/p repair: Patched with omentum and left NG tube with drain.   High risk for ileus given extensive vascular surgery.  Per surgery, would wait several days before initiating tube feeds given extent of surgery and need for laceration repair of duodenum.  No Cortrak without talking to general surgery> high risk for perforation given area of repair -NPO, NGT to LIWS -TPN  Right ureteroureterostomy 8/12 with ureteral stent placement/exchange for complete mid ureteral transection.  Creatinine and fluid from drain is similar to serum level, no leak. P: -Appreciate Urology's management  -Continue Foley catheter for minimum 10 days -keep ureteral stent at least 6 weeks -Strict I's/O  Shock, improved, related to ABLA and fluid shifts post op; echo okay ABLA Rising white count -wean pressors to maintain MAP >65 and SBP >90 - Avoid hypothermia and acidemia - Low threshold to re-image abdomen  Post op ATN/renal failure - to start CRRT today, appreciate nephro help  Hyperglycemia, prediabetes-controlled with minimal insulin P: - SSI as needed - Goal BG 140-180 while admitted to the ICU  COPD without acute exacerbation - Nebs as ordered  hypothyroidism -Continue IV Synthroid until able to take p.o.  H/o HTN Paroxysmal Afib -hold home beta blocker while on pressors; can use amiodarone if HR control is required -Continue to hold home Eliquis; full dose AC at some point  HLD -hold statin while NPO  Shock liver - f/u repeat labs, ammonia - concentrate TPN - echo/RUQ reviewed, okay  Metabolic encephalopathy- related to ATN, shock liver and inability to clear sedating agents from surgery/postop: hopefully clears up with CRRT and supportive care; check ammonia for completeness sake - Avoid sedating agents - Day/night reinforcement, family visits,  reorientation PRN  Abnormal carotid doppler on echo- check carotid doppler study when/if she recovers  Multiorgan failure postop in 75 year old woman with multiple comorbidities- appreciate palliative starting talks with family in case things go [employer] (right click and "Production designer, theatre/television/film" daily)   Diet/type: tpn DVT prophylaxis: heparin GI prophylaxis: PPI Lines: Central line and Arterial Line Foley:  Yes, and it is still needed Code Status:  full code Last date of multidisciplinary goals of care discussion per primary   Patient critically ill due to shock, multiorgan failure Interventions to address this today pressor titration, CRRT Risk of deterioration without these interventions is high  I personally spent 38 minutes providing critical care not including any separately billable procedures  Erskine Emery MD [facility]  Prefer epic messenger for cross cover needs If after hours, please call E-link

## 2020-10-06 NOTE — Progress Notes (Signed)
eLink Physician-Brief Progress Note Patient Name: [name] DOB: 05/11/1945 MRN: [id number]   Date of Service  10/06/2020  HPI/Events of Note  Patient with paroxysmal atrial fibrillation with RVR, MAP dropped to 60 with RVR, Qtc 481.  eICU Interventions  Amiodarone bolus + infusion ordered.        Frederik Pear 10/06/2020, 9:13 PM

## 2020-10-06 NOTE — Progress Notes (Signed)
RN having trouble obtaining Spo2 readings on the monitor. RN moved probes to different fingers and forehead probe.  Monitor showed sats in the 70-80s. RN drew an ABG. Results called to Lysbeth Galas, RN with [place].

## 2020-10-06 NOTE — Progress Notes (Signed)
PHARMACY - TOTAL PARENTERAL NUTRITION CONSULT NOTE  Indication:  Intolerance to enteral feeding  Patient Measurements: Height: 4' 11.02" (149.9 cm) Weight: 50.2 kg (110 lb 10.7 oz) IBW/kg (Calculated) : 43.24 TPN AdjBW (KG): 45.6 Body mass index is 22.34 kg/m.t:   Assessment:   Patient present 8/9 with L femoral stent bleeding/infection despite omadacycline PTA.  S/p repair of left femoral anastomosis with bovine pericardial patch for bleeding on 8/9. There was small serosal injury to duodenum s/p procedure 8/12 patched with omentum. High risk for ileus. No tube feeds or oral intake given duodenal injury.  No Cortak should be placed per vascular note 8/14 due to high risk of perforation in area of repair.  TPN consulted for TPN management.  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 CBG < 10 on 8/16 likely an error, others controlled.  No SSI use on 8/15. Electrolytes: all WNL except Phos at 5.2 (none in TPN, Ca x Phos = 48, goal < 55) Renal: Right ureteroureterostomy 8/12 with ureteral stent placement/exchange for complete mid ureteral transection CRRT started 8/16 - SCr 4.26, BUN 51 Hepatic: AST/ALT down to 358/715, alk phos up to 171, tbili up to 1.5, TG 94 > 186 (ILE 20% of total kCal), albumin 1.5, BL prealbumin WNL 20.4 Intake / Output; MIVF: duodenal drain 275m, NG 2559m LBM 8/15, net +14.5L GI Imaging:  N/A GI Surgeries / Procedures:  - 8/9 repair of L femoral anastomosis with bovine pericardial patch for bleeding - 8/12 removal of aorta bi-femoral bypass graft, R bi-femoral and L axillary popliteal bypass graft   Central access: CVC TL placed 10/04/2020 TPN start date: 10/04/20  Nutritional Goals (per RD rec on 8/15): 1550-1750 kCal, 78-88 AA, >/= 1.6L fluid per  day  Current Nutrition:  TPN  Plan:  Increase concentrated TPN to goal rate of 55 ml/hr at 1800 TPN will provide 90g AA, 264g CHO and 30g ILE for a total of 1560kCal, meeting 100% of patient's needs Electrolytes in TPN: Na 5058mL, K 8mE59m, Ca 5mEq57m Mg 0mEq/29mPhos 0mmol/83mCl:Ac 1:1 Add standard MVI and trace elements to TPN.  No chromium while on RRT. Continue sensitive SSI Q4H Renal function panel BID and daily Mag; daily hepatic fxn panel through 8/21 D/C Q72H TG checks  F/U with Surgery's plan for possible TF  Sahirah Rudell D. Koy Lamp, PMina MarbleD, BCPS, BCCCP [id number], 9:24 AM

## 2020-10-06 NOTE — Progress Notes (Addendum)
eLink Physician-Brief Progress Note Patient Name: [name] DOB: [birth year] MRN: [id number]   Date of Service  10/06/2020  HPI/Events of Note  Patient with soft blood pressures and hypoxemia on 6 liters via nasal cannula oxygen. Patient dropped her blood pressure with Dilaudid iv.  eICU Interventions  Vasopressin added to Phenylephrine gtt, Oxygen changed to 15 liters high flow, Dilaudid discontinued and Tylenol PRN ordered. Albumin 25 % 12.5 gm iv x 1. ABG at 11:45 PM.        Kerry Kass Kyel Purk 10/06/2020, 10:11 PM

## 2020-10-06 NOTE — Progress Notes (Signed)
Urology Inpatient Progress Report  Other mechanical complication of femoral arterial graft (bypass), initial encounter ([place]) [T82.392A] Infected prosthetic vascular graft ([place]) [T82.7XXA]  Procedure(s): REMOVAL OF INFECTED AORTA BI-FEMORAL BYPASS GRAFT RIGTH AXILLARY TO  RIGHT FEMORAL BYPASS GRAFT LEFT AXILLARY TO LEFT FEMORAL BYPASS GRAFT SMALL BOWEL REPAIR PATCH ANGIOPLASTY EXPLORATORY LAPAROTOMY INSERTION OF 63F x 26cm CONTOUR STENT IN RIGHT URETER AND RIGHT URETEROURETEROSTOMY  4 Days Post-Op from intraoperative consultation for right ureteral transection repair.  We performed right ureteroureterostomy, right ureteral stent exchange.  Left ureter did not appear injured.  JP creatinine on postop day 1 was equal to serum creatinine.  Intv/Subj: Patient has been extubated.  Remains disoriented.  Mitts in place.  Persistent dark and low urine output.  Creatinine has continued to uptrend and patient has been started on continuous renal replacement therapy.    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[place])   Endotracheally intubated   Encounter for postanesthesia care   Hypomagnesemia   Hypothyroidism   Encounter for central line placement   Acute respiratory failure with hypoxia ([place])   Other mechanical complication of femoral arterial graft (bypass), initial encounter ([facility])  Current Facility-Administered Medications  Medication Dose Route Frequency Provider Last Rate Last Admin    prismasol BGK 4/2.5 infusion   CRRT Continuous Elmarie Shiley, MD 400 mL/hr at 10/06/20 0832 New Bag at 10/06/20 [facility] 4/2.5 infusion   CRRT Continuous Elmarie Shiley, MD 200 mL/hr at 10/06/20 0832 New Bag at 10/06/20 0832   0.9 %  sodium chloride infusion (Manually program via Guardrails IV Fluids)   Intravenous Once Mauri Brooklyn, MD       0.9 %  sodium chloride infusion   Intravenous PRN Elam Dutch, MD 10 mL/hr at 10/06/20 1400 Infusion Verify at  10/06/20 1400   acetaminophen (TYLENOL) tablet 325-650 mg  325-650 mg Oral Q4H PRN Barbie Banner, PA-C       Or   acetaminophen (TYLENOL) suppository 325-650 mg  325-650 mg Rectal Q4H PRN Barbie Banner, PA-C       albuterol (PROVENTIL) (2.5 MG/3ML) 0.083% nebulizer solution 2.5 mg  2.5 mg Nebulization Q4H PRN Noemi Chapel P, DO   2.5 mg at 10/03/20 0758   arformoterol (BROVANA) nebulizer solution 15 mcg  15 mcg Nebulization BID Mick Sell, PA-C   15 mcg at 10/06/20 0827   bisacodyl (DULCOLAX) suppository 10 mg  10 mg Rectal Daily PRN Barbie Banner, PA-C       budesonide (PULMICORT) nebulizer solution 0.25 mg  0.25 mg Nebulization BID Andres Labrum D, PA-C   0.25 mg at 10/06/20 0827   ceFEPIme (MAXIPIME) 2 g in sodium chloride 0.9 % 100 mL IVPB  2 g Intravenous Q12H Elam Dutch, MD   Stopped at 10/06/20 1046   chlorhexidine (PERIDEX) 0.12 % solution 15 mL  15 mL Mouth Rinse BID Elam Dutch, MD   15 mL at 10/06/20 0912   Chlorhexidine Gluconate Cloth 2 % PADS 6 each  6 each Topical Daily Barbie Banner, PA-C   6 each at 10/06/20 1130   DAPTOmycin (CUBICIN) 400 mg in sodium chloride 0.9 % IVPB  8 mg/kg Intravenous Q48H Carlyle Basques, MD       diphenhydrAMINE (BENADRYL) injection 25 mg  25 mg Intravenous Q6H PRN Barbie Banner, PA-C   25 mg at 10/01/20 1738   heparin injection 1,000-6,000 Units  1,000-6,000 Units CRRT PRN Posey Pronto,  Ulice Dash, MD       heparin injection 5,000 Units  5,000 Units Subcutaneous Q8H Serafina Mitchell, MD   5,000 Units at 10/06/20 1331   heparinized saline (2000 units/L) primer fluid for CRRT   CRRT PRN Elmarie Shiley, MD       hydrALAZINE (APRESOLINE) injection 5 mg  5 mg Intravenous Q20 Min PRN Setzer, Edman Circle, PA-C       HYDROmorphone (DILAUDID) injection 1 mg  1 mg Intravenous Q2H PRN Barbie Banner, PA-C   1 mg at 10/06/20 0002   insulin aspart (novoLOG) injection 0-9 Units  0-9 Units Subcutaneous Q4H Mick Sell, PA-C   1 Units at 10/06/20 1138    levothyroxine (SYNTHROID, LEVOTHROID) injection 50 mcg  50 mcg Intravenous Daily Renee Pain, MD   50 mcg at 10/06/20 [id number]   magnesium sulfate IVPB 2 g 50 mL  2 g Intravenous Daily PRN Barbie Banner, PA-C       MEDLINE mouth rinse  15 mL Mouth Rinse q12n4p Elam Dutch, MD   15 mL at 10/06/20 1139   metroNIDAZOLE (FLAGYL) IVPB 500 mg  500 mg Intravenous Kateri Mc, MD   Stopped at 10/06/20 1001   pantoprazole (PROTONIX) injection 40 mg  40 mg Intravenous QHS Renee Pain, MD   40 mg at 10/05/20 2253   phenol (CHLORASEPTIC) mouth spray 1 spray  1 spray Mouth/Throat PRN Barbie Banner, PA-C       phenylephrine CONCENTRATED '100mg'$  in sodium chloride 0.9% 258m (0.'4mg'$ /mL) premix infusion  0-400 mcg/min Intravenous Titrated FElam Dutch MD 6 mL/hr at 10/06/20 1400 40 mcg/min at 10/06/20 1400   potassium chloride SA (KLOR-CON) CR tablet 20-40 mEq  20-40 mEq Oral Daily PRN SBarbie Banner PA-C       prismasol BGK 4/2.5 infusion   CRRT Continuous PElmarie Shiley MD 1,500 mL/hr at 10/06/20 1154 New Bag at 10/06/20 1154   sodium chloride flush (NS) 0.9 % injection 10-40 mL  10-40 mL Intracatheter Q12H SBarbie Banner PA-C   10 mL at 10/06/20 0945   sodium chloride flush (NS) 0.9 % injection 10-40 mL  10-40 mL Intracatheter PRN SBarbie Banner PA-C       TPN ADULT (ION)   Intravenous Continuous TPN OBertis Ruddy RPH 40 mL/hr at 10/06/20 1400 Infusion Verify at 10/06/20 1400   TPN ADULT (ION)   Intravenous Continuous TPN DJohnnette GourdD, RPH         Objective: Vital: Vitals:   10/06/20 1330 10/06/20 1345 10/06/20 1400 10/06/20 1415  BP:   (!) 101/50   Pulse: 73 74 71 77  Resp: '16 17 14 20  '$ Temp:      TempSrc:      SpO2: 92% 90% 92% 92%  Weight:      Height:       I/Os: I/O last 3 completed shifts: In: 2839.9 [I.V.:2389.7; IV Piggyback:450.1] Out: [id number][Urine:43; Emesis/NG output:350; Drains:350]  Physical Exam:  General: Disoriented, tired, frail. Lungs:  Breathing comfortably on nasal cannula GI: Abdomen soft.  Midline incision is dressed with minimal saturation. JP drain with minimal serosanguinous drainage in bulb Foley: Draining light red urine in Foley. Ext: lower extremities symmetric.  Mitts in place to upper extremities  Lab Results: Recent Labs    10/05/20 0348 10/05/20 0757 10/05/20 1629 10/06/20 0451  WBC 24.7*  --  24.7* 28.0*  HGB 9.5* 8.5* 8.9* 8.8*  HCT 29.6* 25.0* 28.4* 26.7*    Recent  Labs    10/05/20 0348 10/05/20 0757 10/05/20 1629 10/06/20 0451  NA 138 139 139 140  K 4.9 4.3 4.3 4.5  CL 103  --  103 105  CO2 26  --  24 24  GLUCOSE 120*  --  131* 136*  BUN 39*  --  45* 51*  CREATININE 3.57*  --  3.81* 4.26*  CALCIUM 7.0*  --  7.0* 7.2*    Recent Labs    10/04/20 1203 10/05/20 0950 10/06/20 0451  INR 1.4* 1.4* 1.4*    No results for input(s): LABURIN in the last 72 hours. Results for orders placed or performed during the hospital encounter of 10/15/2020  SARS CORONAVIRUS 2 (TAT 6-24 HRS) Nasopharyngeal Nasopharyngeal Swab     Status: None   Collection Time: 10/13/2020  1:27 PM   Specimen: Nasopharyngeal Swab  Result Value Ref Range Status   SARS Coronavirus 2 NEGATIVE NEGATIVE Final    Comment: (NOTE) SARS-CoV-2 target nucleic acids are NOT DETECTED.  The SARS-CoV-2 RNA is generally detectable in upper and lower respiratory specimens during the acute phase of infection. Negative results do not preclude SARS-CoV-2 infection, do not rule out co-infections with other pathogens, and should not be used as the sole basis for treatment or other patient management decisions. Negative results must be combined with clinical observations, patient history, and epidemiological information. The expected result is Negative.  Fact Sheet for Patients: SugarRoll.be  Fact Sheet for Healthcare Providers: https://www.woods-mathews.com/  This test is not yet approved or  cleared by the [place] FDA and  has been authorized for detection and/or diagnosis of SARS-CoV-2 by FDA under an Emergency Use Authorization (EUA). This EUA will remain  in effect (meaning this test can be used) for the duration of the COVID-19 declaration under Se ction 564(b)(1) of the Act, 21 U.S.C. section 360bbb-3(b)(1), unless the authorization is terminated or revoked sooner.  Performed at [facility], [place] [address], [place], [place] [address]   Aerobic/Anaerobic Culture w Gram Stain (surgical/deep wound)     Status: None   Collection Time: 10/21/2020  6:45 PM   Specimen: Groin, Left; Wound  Result Value Ref Range Status   Specimen Description WOUND LEFT GROIN  Final   Special Requests PT ON VANC  Final   Gram Stain NO WBC SEEN NO ORGANISMS SEEN   Final   Culture   Final    No growth aerobically or anaerobically. Performed at [facility], [place] [address] [place], [place] [address]    Report Status 10/04/2020 FINAL  Final  MRSA Next Gen by PCR, Nasal     Status: None   Collection Time: 09/24/2020 10:38 PM   Specimen: Nasal Mucosa; Nasal Swab  Result Value Ref Range Status   MRSA by PCR Next Gen NOT DETECTED NOT DETECTED Final    Comment: (NOTE) The GeneXpert MRSA Assay (FDA approved for NASAL specimens only), is one component of a comprehensive MRSA colonization surveillance program. It is not intended to diagnose MRSA infection nor to guide or monitor treatment for MRSA infections. Test performance is not FDA approved in patients less than 23 years old. Performed at [facility], [place] [address] [place], [place] [address]   Culture, blood (routine x 2)     Status: None   Collection Time: 09/30/20 10:37 AM   Specimen: BLOOD LEFT HAND  Result Value Ref Range Status   Specimen Description BLOOD LEFT HAND  Final   Special Requests  Final    BOTTLES DRAWN AEROBIC AND ANAEROBIC Blood Culture results may not be optimal due to an inadequate volume  of blood received in culture bottles   Culture   Final    NO GROWTH 5 DAYS Performed at [facility], [place] [address], [place], [place] [address]    Report Status 10/05/2020 FINAL  Final  Culture, blood (routine x 2)     Status: None   Collection Time: 09/30/20 10:43 AM   Specimen: BLOOD LEFT ARM  Result Value Ref Range Status   Specimen Description BLOOD LEFT ARM  Final   Special Requests   Final    BOTTLES DRAWN AEROBIC AND ANAEROBIC Blood Culture adequate volume   Culture   Final    NO GROWTH 5 DAYS Performed at [facility], [place] [address], [place], [place] [address]    Report Status 10/05/2020 FINAL  Final  Aerobic/Anaerobic Culture w Gram Stain (surgical/deep wound)     Status: None (Preliminary result)   Collection Time: 09/21/2020  8:50 AM   Specimen: PATH Soft tissue  Result Value Ref Range Status   Specimen Description WOUND  Final   Special Requests RIGHT GROIN WOUND SPEC A  Final   Culture   Final    NO GROWTH 4 DAYS NO ANAEROBES ISOLATED; CULTURE IN PROGRESS FOR 5 DAYS Performed at [facility], [place] [address], [place], [place] [address]    Report Status PENDING  Incomplete  Gram stain     Status: None   Collection Time: 10/18/2020  8:50 AM   Specimen: Wound  Result Value Ref Range Status   Specimen Description WOUND  Final   Special Requests NONE  Final   Gram Stain   Final    RARE WBC PRESENT,BOTH PMN AND MONONUCLEAR NO ORGANISMS SEEN RESULT CALLED TO, READ BACK BY AND VERIFIED WITH: RN M.GOIN AT [id number] ON 09/28/2020 BY T.SAAD. Performed at [facility], [place] [address] [place], [place] [address]    Report Status 10/01/2020 FINAL  Final  Aerobic/Anaerobic Culture w Gram Stain (surgical/deep wound)     Status: None (Preliminary result)   Collection Time: 10/04/2020  2:25 PM   Specimen: PATH Other  Result Value Ref Range Status   Specimen Description WOUND  Final   Special Requests AROTIC GRAFT  Final   Gram Stain   Final    RARE WBC  PRESENT,BOTH PMN AND MONONUCLEAR NO ORGANISMS SEEN Performed at [facility], [place] [address] [employer], [place] [address]    Culture   Final    RARE CANDIDA ALBICANS NO ANAEROBES ISOLATED; CULTURE IN PROGRESS FOR 5 DAYS    Report Status PENDING  Incomplete  Aerobic/Anaerobic Culture w Gram Stain (surgical/deep wound)     Status: None (Preliminary result)   Collection Time: 10/06/2020  2:56 PM   Specimen: PATH Other  Result Value Ref Range Status   Specimen Description WOUND  Final   Special Requests RIGHT LIMB OF AORTIC GRAFT  Final   Gram Stain   Final    FEW WBC PRESENT,BOTH PMN AND MONONUCLEAR NO ORGANISMS SEEN    Culture   Final    NO GROWTH 4 DAYS NO ANAEROBES ISOLATED; CULTURE IN PROGRESS FOR 5 DAYS Performed at [facility], [place] [address], [place],  [address]    Report Status PENDING  Incomplete  Aerobic/Anaerobic Culture w Gram Stain (surgical/deep wound)     Status: None (Preliminary result)  Collection Time: 09/21/2020  3:12 PM   Specimen: PATH Other  Result Value Ref Range Status   Specimen Description WOUND  Final   Special Requests LEFT LIMB AORTIC GRAFT FOR CULTURES SPEC D  Final   Gram Stain   Final    FEW WBC PRESENT, PREDOMINANTLY PMN NO ORGANISMS SEEN    Culture   Final    NO GROWTH 3 DAYS NO ANAEROBES ISOLATED; CULTURE IN PROGRESS FOR 5 DAYS Performed at [facility], [address], [place], [place] [address]    Report Status PENDING  Incomplete  Culture, Respiratory w Gram Stain     Status: None   Collection Time: 10/04/20  8:05 AM   Specimen: Tracheal Aspirate; Respiratory  Result Value Ref Range Status   Specimen Description TRACHEAL ASPIRATE  Final   Special Requests NONE  Final   Gram Stain   Final    NO WBC SEEN RARE YEAST Performed at [facility], [address], [place], [place] [address]    Culture MODERATE CANDIDA GLABRATA  Final   Report Status 10/06/2020 FINAL  Final    Studies/Results: DG Chest Port 1  View  Result Date: 10/05/2020 CLINICAL DATA:  Post central line placement EXAM: PORTABLE CHEST 1 VIEW COMPARISON:  10/04/2020 FINDINGS: New left IJ double-lumen line tip overlies central SVC. Right IJ central line overlies SVC. Enteric tube passes below the diaphragm with tip out of field of view. Endotracheal tube is no longer present. Minor bibasilar atelectasis. No pneumothorax. Stable mild interstitial prominence. Stable cardiomediastinal contours. IMPRESSION: New left IJ central line tip overlies SVC.  No pneumothorax. Mild bibasilar atelectasis. Electronically Signed   By: Macy Mis M.D.   On: 10/05/2020 15:26   ECHOCARDIOGRAM COMPLETE  Result Date: 10/05/2020    ECHOCARDIOGRAM REPORT   Patient Name:   [name] Date of Exam: 10/05/2020 Medical Rec #:  [id number]      Height:       59.0 in Accession #:    [id number]     Weight:       110.7 lb Date of Birth:  09-17-1945     BSA:          1.434 m Patient Age:    75 years       BP:           131/62 mmHg Patient Gender: F              HR:           71 bpm. Exam Location:  Inpatient Procedure: 2D Echo, Color Doppler and Cardiac Doppler Indications:    I42.80 Non-ischemic cardiomyopathy  History:        Patient has no prior history of Echocardiogram examinations.                 COPD; Risk Factors:Dyslipidemia and Hypertension.  Sonographer:    Raquel Sarna Senior RDCS Referring Phys: [id number] [place]  1. Left ventricular ejection fraction, by estimation, is 60 to 65%. The left ventricle has normal function. The left ventricle has no regional wall motion abnormalities. Left ventricular diastolic parameters are consistent with Grade II diastolic dysfunction (pseudonormalization).  2. Right ventricular systolic function is normal. The right ventricular size is normal. Tricuspid regurgitation signal is inadequate for assessing PA pressure.  3. Left atrial size was mildly dilated.  4. The mitral valve is normal in structure. No evidence of  mitral valve regurgitation. No evidence of mitral stenosis.  The mean mitral valve gradient is 4.0 mmHg with average heart rate of 76 bpm.  5. The aortic valve was not well visualized. Aortic valve regurgitation is mild. Mild to moderate aortic valve sclerosis/calcification is present, without any evidence of aortic stenosis.  6. The inferior vena cava is normal in size with greater than 50% respiratory variability, suggesting right atrial pressure of 3 mmHg.  7. Increased pulse wave Spectral Doppler near the takeoff of the left common carotid. Consider dedicated study for assessment of carotid artery stenosis if clinically indicated. Comparison(s): No prior Echocardiogram. FINDINGS  Left Ventricle: Left ventricular ejection fraction, by estimation, is 60 to 65%. The left ventricle has normal function. The left ventricle has no regional wall motion abnormalities. The left ventricular internal cavity size was small. There is no left ventricular hypertrophy of the basal-septal segment. Left ventricular diastolic parameters are consistent with Grade II diastolic dysfunction (pseudonormalization). Right Ventricle: The right ventricular size is normal. No increase in right ventricular wall thickness. Right ventricular systolic function is normal. Tricuspid regurgitation signal is inadequate for assessing PA pressure. Left Atrium: Left atrial size was mildly dilated. Right Atrium: Right atrial size was normal in size. Pericardium: There is no evidence of pericardial effusion. Mitral Valve: The mitral valve is normal in structure. No evidence of mitral valve regurgitation. No evidence of mitral valve stenosis. The mean mitral valve gradient is 4.0 mmHg with average heart rate of 76 bpm. Tricuspid Valve: The tricuspid valve is normal in structure. Tricuspid valve regurgitation is trivial. No evidence of tricuspid stenosis. Aortic Valve: The aortic valve was not well visualized. Aortic valve regurgitation is mild. Mild to  moderate aortic valve sclerosis/calcification is present, without any evidence of aortic stenosis. Aortic valve mean gradient measures 8.0 mmHg. Aortic valve peak gradient measures 19.7 mmHg. Aortic valve area, by VTI measures 2.26 cm. Pulmonic Valve: The pulmonic valve was grossly normal. Pulmonic valve regurgitation is not visualized. No evidence of pulmonic stenosis. Aorta: The aortic root is normal in size and structure. Venous: The inferior vena cava is normal in size with greater than 50% respiratory variability, suggesting right atrial pressure of 3 mmHg. IAS/Shunts: The atrial septum is grossly normal.  LEFT VENTRICLE PLAX 2D LVIDd:         3.70 cm  Diastology LVIDs:         2.40 cm  LV e' medial:    6.74 cm/s LV PW:         0.80 cm  LV E/e' medial:  18.2 LV IVS:        1.10 cm  LV e' lateral:   8.49 cm/s LVOT diam:     1.90 cm  LV E/e' lateral: 14.5 LV SV:         95 LV SV Index:   66 LVOT Area:     2.84 cm  RIGHT VENTRICLE RV S prime:     15.90 cm/s TAPSE (M-mode): 2.2 cm LEFT ATRIUM             Index       RIGHT ATRIUM           Index LA diam:        2.70 cm 1.88 cm/m  RA Area:     12.80 cm LA Vol (A2C):   58.7 ml 40.93 ml/m RA Volume:   30.80 ml  21.47 ml/m LA Vol (A4C):   42.8 ml 29.84 ml/m LA Biplane Vol: 50.3 ml 35.07 ml/m  AORTIC VALVE AV Area (Vmax):  2.21 cm AV Area (Vmean):   2.39 cm AV Area (VTI):     2.26 cm AV Vmax:           222.00 cm/s AV Vmean:          127.000 cm/s AV VTI:            0.421 m AV Peak Grad:      19.7 mmHg AV Mean Grad:      8.0 mmHg LVOT Vmax:         173.00 cm/s LVOT Vmean:        107.000 cm/s LVOT VTI:          0.335 m LVOT/AV VTI ratio: 0.80  AORTA Ao Root diam: 3.10 cm MITRAL VALVE MV Area (PHT): 3.27 cm     SHUNTS MV Mean grad:  4.0 mmHg     Systemic VTI:  0.34 m MV Decel Time: 232 msec     Systemic Diam: 1.90 cm MV E velocity: 123.00 cm/s MV A velocity: 145.00 cm/s MV E/A ratio:  0.85 Rudean Haskell MD Electronically signed by Rudean Haskell MD  Signature Date/Time: 10/05/2020/4:08:43 PM    Final    US Abdomen Limited RUQ (LIVER/GB)  Result Date: 10/05/2020 CLINICAL DATA:  Transaminitis EXAM: ULTRASOUND ABDOMEN LIMITED RIGHT UPPER QUADRANT COMPARISON:  CT abdomen and pelvis dated October 30, 2012 FINDINGS: Gallbladder: Gallbladder is surgically absent. Common bile duct: Diameter: 7 mm Liver: No focal lesion identified. Heterogeneous echotexture. Portal vein is patent on color Doppler imaging with normal direction of blood flow towards the liver. Other: Right pleural effusion. IMPRESSION: Mildly dilated common bile duct which can be seen status post cholecystectomy. Small right pleural effusion. Electronically Signed   By: Yetta Glassman M.D.   On: 10/05/2020 11:04    Assessment: Right ureteral injury status post right ureteroureterostomy  Procedure(s): REMOVAL OF INFECTED AORTA BI-FEMORAL BYPASS GRAFT RIGTH AXILLARY TO  RIGHT FEMORAL BYPASS GRAFT LEFT AXILLARY TO LEFT FEMORAL BYPASS GRAFT SMALL BOWEL REPAIR PATCH ANGIOPLASTY EXPLORATORY LAPAROTOMY INSERTION OF 67F x 26cm CONTOUR STENT IN RIGHT URETER AND RIGHT URETEROURETEROSTOMY, 4 Days Post-Op  doing well.  Plan:  Continue Foley catheter and bilateral ureteral stents.  We will continue to monitor for hopeful improvement in renal function with continued resuscitation.  Patient is currently on CRRT.    10/06/2020, 2:35 PM

## 2020-10-06 NOTE — Progress Notes (Signed)
Nutrition Follow-up  DOCUMENTATION CODES:   Not applicable  INTERVENTION:   Continue TPN to meet 100% estimated needs; TPN order per Pharmacy  Tube Feeding Recommendations: Vital 1.5 at 20 ml/hr, goal rate of 45 ml/hr Pro-Source TF 45 mL BID  NUTRITION DIAGNOSIS:   Increased nutrient needs related to acute illness as evidenced by estimated needs.  Being addressed via TPN  GOAL:   Patient will meet greater than or equal to 90% of their needs  Progressing  MONITOR:   PO intake, Supplement acceptance, Labs, Weight trends  REASON FOR ASSESSMENT:   Malnutrition Screening Tool    ASSESSMENT:   75 yo female admitted with infected left groin aortobifem. PMH includes HLD, stroke, IBS, PVD  8/09 Debridement and irrigation of left groin patch angioplasty L common fem artery  8/12 Removal of aorta bi-fem bypass graft, placement of right axillary bi-fem bypass graft, left axillary popliteal bypass great, ureteral stent placement, Serosal injury to duodenum requiring patch 8/14 Extubated, TPN initiated  Pt alert, remains confused CRRT initiated today, Pt remains anuric Weaning neosynephrine  NPO, NG tube in place, remains on TPN. TPN increased to goal today of 55 ml/hr providing 1560 kcals, 90 g of protein  No weight since 8/11  Per surgery note, ok to begin trickle TF starting tomorrow. Noted surgery is still recommending avoidance of Cortrak placement  Labs: CBGs 102-130, phosphorus 5.2, potassium wdl, sodium wdl Meds: reviewed    Diet Order:   Diet Order             Diet NPO time specified  Diet effective now                   EDUCATION NEEDS:   Not appropriate for education at this time  Skin:  Skin Assessment: Skin Integrity Issues: Skin Integrity Issues:: Incisions Incisions: abdomen, groin  Last BM:  8/15  Height:   Ht Readings from Last 1 Encounters:  09/21/2020 4' 11.02" (1.499 m)    Weight:   Wt Readings from Last 1 Encounters:   10/01/20 50.2 kg     BMI:  Body mass index is 22.34 kg/m.  Estimated Nutritional Needs:   Kcal:  1650-1850 kcals  Protein:  85-100 g  Fluid:  >/= 1.6 L  Kerman Passey MS, RDN, LDN, CNSC Registered Dietitian III Clinical Nutrition RD Pager and On-Call Pager Number Located in [place]

## 2020-10-06 NOTE — Progress Notes (Signed)
Pt had an episode of Afib RVR with rated 140-160. Pt BP dropped to MAPs <60 while on Neo. Pt converted back in NSR and BP stabilized. Vascular and CCM aware. No new orders at this time

## 2020-10-06 NOTE — Progress Notes (Addendum)
[facility], but does not respond.   Objective (!) 127/53 75 98.1 F (36.7 C) (Oral) 16 98%  Intake/Output Summary (Last 24 hours) at 10/06/2020 0655 Last data filed at 10/06/2020 0543 Gross per 24 hour  Intake 1901.18 ml  Output 515 ml  Net 1386.18 ml    Abdomin compressible, incision healing well B groins soft without hematoma Feet warm and well perfused with protective boots to reduce pressure sores Lungs [place] O 2 3L support 98 % Heart RRR NG tube in place 200 cc OP JP drains right LQ 250 cc OP Urine OP 25 cc  Assessment/Planning: POD # 4  Procedure: 1.  Right axillary femoral bypass (8 mm dacron)  2.  Left axillary to superficial femoral artery bypass (8 mm ring supported PTFE)  3.  Removal of aortobifemoral bypass with oversewing of aortic stump just below the renal arteries  4.  Vein patch angioplasty left common femoral artery  Nephrology plans to start CRRT today Labs still pending Maintain NG tube, TPN for nutritional support Temp non tunneled CVC placed by CCM ID following IV antibiotic recommendation infected vascular graft with leukocytosis WBC 28 . infected vascular graft, switch Vanco to daptomycin secondary to AKI and added metronidazole. Hypotension = continues to require vasopressor support   Roxy Horseman 10/06/2020 6:55 AM --  Laboratory Lab Results: Recent Labs    10/05/20 1629 10/06/20 0451  WBC 24.7* 28.0*  HGB 8.9* 8.8*  HCT 28.4* 26.7*  PLT 113* 120*   BMET Recent Labs    10/05/20 0348 10/05/20 0757 10/05/20 1629  NA 138 139 139  K 4.9 4.3 4.3  CL 103  --  103  CO2 26  --  24  GLUCOSE 120*  --  131*  BUN 39*  --  45*  CREATININE 3.57*  --  3.81*  CALCIUM 7.0*  --  7.0*    COAG Lab Results  Component Value Date   INR 1.4 (H) 10/06/2020   INR 1.4 (H) 10/05/2020   INR 1.4 (H) 10/04/2020   No results found for: PTT  I have seen and evaluated the patient. I  agree with the PA note as documented above. Postop day 4 status post right axillary to femoral bypass and left axillary to SFA bypass and excision of infected aortobifem.  She remains critically ill in the ICU in multi-organ failure.  Neuro: Opens her eyes.  Confused.  Not following commands.   CV: Neo weaned from 140 mcg yesterday to 100 mcg today.  Hemoglobin 8.9 --> 8.8, stable. Pulm: Extubated Sunday.  3 L nasal cannula.  Getting breathing treatments from respiratory therapy. GI: Keep NPO.  NG tube bilious output.  Has drains for duodenal injury that is putting out serosanguineous.  TPN started over weekend.  Rising liver enzymes - improving today. Renal: Minimal urine output overnight, 10 mL.  Creatinine up to 4.26 today rising BUN.  Suprarenal clamp.  Plan to start CRRT today and appreciate nephrology ID: Appreciate ID.  Rising leukocytosis.  No fevers overnight.  Drain outputs still serosanguinous.  Continue daptomycin, cefepime, flagyl. Extremities: Palpable DP pulses.  All incisions look good. Keep in ICU.  Marty Heck, MD [facility] Office: [phone]

## 2020-10-06 NOTE — Progress Notes (Signed)
Patient ID: [name], female   DOB: 03-Sep-1945, 75 y.o.   MRN: [id number] [facility] Progress Note   Assessment/ Plan:   1.  Acute kidney injury: Timeline of events and available data points largely to ischemic ATN with major vascular surgery required as a lifesaving procedure.  She remains anuric overnight and I have requested for labs this morning to assess electrolytes/GFR.  With increased encephalopathy likely related to her worsening azotemia, will begin CRRT today. 2.  Removal of aortobifemoral bypass graft with revascularization surgery of lower extremities complicated by serosal duodenal injury and right ureteral transection.  Hemodynamically stable from vascular surgery standpoint and plans noted for management of ureteral stent.  Indwelling Foley catheter in place. 3.  Hypoxic respiratory failure: Previously ventilator dependent and successfully extubated on 8/14-maintaining decent oxygenation with supplementation via nasal cannula. 4.  Anemia: Secondary to acute blood loss/vascular surgery.  Continue to monitor for need to replace with PRBCs.  Subjective:   Without acute events overnight, remains anuric.  Increased encephalopathy overnight.   Objective:   BP (!) 127/53   Pulse 75   Temp 98.1 F (36.7 C) (Oral)   Resp 16   Ht 4' 11.02" (1.499 m)   Wt 50.2 kg   SpO2 98%   BMI 22.34 kg/m   Intake/Output Summary (Last 24 hours) at 10/06/2020 0659 Last data filed at 10/06/2020 0543 Gross per 24 hour  Intake 1901.18 ml  Output 515 ml  Net 1386.18 ml   Weight change:   Physical Exam: Gen: Encephalopathic, minimally interactive CVS: Pulse regular rhythm, normal rate, normal S1 and S2.  Temporary left IJ HD catheter Resp: Coarse/transmitted breath sounds bilaterally, no distinct rales or rhonchi Abd: Soft, flat, surgical incisions with intact staples Ext: 1+ lower extremity edema with 2+ upper extremity edema  Imaging: DG Chest Port 1 View  Result  Date: 10/05/2020 CLINICAL DATA:  Post central line placement EXAM: PORTABLE CHEST 1 VIEW COMPARISON:  10/04/2020 FINDINGS: New left IJ double-lumen line tip overlies central SVC. Right IJ central line overlies SVC. Enteric tube passes below the diaphragm with tip out of field of view. Endotracheal tube is no longer present. Minor bibasilar atelectasis. No pneumothorax. Stable mild interstitial prominence. Stable cardiomediastinal contours. IMPRESSION: New left IJ central line tip overlies SVC.  No pneumothorax. Mild bibasilar atelectasis. Electronically Signed   By: Macy Mis M.D.   On: 10/05/2020 15:26   ECHOCARDIOGRAM COMPLETE  Result Date: 10/05/2020    ECHOCARDIOGRAM REPORT   Patient Name:   [name] Date of Exam: 10/05/2020 Medical Rec #:  [id number]      Height:       59.0 in Accession #:    [id number]     Weight:       110.7 lb Date of Birth:  09-Aug-1945     BSA:          1.434 m Patient Age:    45 years       BP:           131/62 mmHg Patient Gender: F              HR:           71 bpm. Exam Location:  Inpatient Procedure: 2D Echo, Color Doppler and Cardiac Doppler Indications:    I42.80 Non-ischemic cardiomyopathy  History:        Patient has no prior history of Echocardiogram examinations.  COPD; Risk Factors:Dyslipidemia and Hypertension.  Sonographer:    Raquel Sarna Senior RDCS Referring Phys: [id number] [place]  1. Left ventricular ejection fraction, by estimation, is 60 to 65%. The left ventricle has normal function. The left ventricle has no regional wall motion abnormalities. Left ventricular diastolic parameters are consistent with Grade II diastolic dysfunction (pseudonormalization).  2. Right ventricular systolic function is normal. The right ventricular size is normal. Tricuspid regurgitation signal is inadequate for assessing PA pressure.  3. Left atrial size was mildly dilated.  4. The mitral valve is normal in structure. No evidence of mitral valve  regurgitation. No evidence of mitral stenosis. The mean mitral valve gradient is 4.0 mmHg with average heart rate of 76 bpm.  5. The aortic valve was not well visualized. Aortic valve regurgitation is mild. Mild to moderate aortic valve sclerosis/calcification is present, without any evidence of aortic stenosis.  6. The inferior vena cava is normal in size with greater than 50% respiratory variability, suggesting right atrial pressure of 3 mmHg.  7. Increased pulse wave Spectral Doppler near the takeoff of the left common carotid. Consider dedicated study for assessment of carotid artery stenosis if clinically indicated. Comparison(s): No prior Echocardiogram. FINDINGS  Left Ventricle: Left ventricular ejection fraction, by estimation, is 60 to 65%. The left ventricle has normal function. The left ventricle has no regional wall motion abnormalities. The left ventricular internal cavity size was small. There is no left ventricular hypertrophy of the basal-septal segment. Left ventricular diastolic parameters are consistent with Grade II diastolic dysfunction (pseudonormalization). Right Ventricle: The right ventricular size is normal. No increase in right ventricular wall thickness. Right ventricular systolic function is normal. Tricuspid regurgitation signal is inadequate for assessing PA pressure. Left Atrium: Left atrial size was mildly dilated. Right Atrium: Right atrial size was normal in size. Pericardium: There is no evidence of pericardial effusion. Mitral Valve: The mitral valve is normal in structure. No evidence of mitral valve regurgitation. No evidence of mitral valve stenosis. The mean mitral valve gradient is 4.0 mmHg with average heart rate of 76 bpm. Tricuspid Valve: The tricuspid valve is normal in structure. Tricuspid valve regurgitation is trivial. No evidence of tricuspid stenosis. Aortic Valve: The aortic valve was not well visualized. Aortic valve regurgitation is mild. Mild to moderate aortic  valve sclerosis/calcification is present, without any evidence of aortic stenosis. Aortic valve mean gradient measures 8.0 mmHg. Aortic valve peak gradient measures 19.7 mmHg. Aortic valve area, by VTI measures 2.26 cm. Pulmonic Valve: The pulmonic valve was grossly normal. Pulmonic valve regurgitation is not visualized. No evidence of pulmonic stenosis. Aorta: The aortic root is normal in size and structure. Venous: The inferior vena cava is normal in size with greater than 50% respiratory variability, suggesting right atrial pressure of 3 mmHg. IAS/Shunts: The atrial septum is grossly normal.  LEFT VENTRICLE PLAX 2D LVIDd:         3.70 cm  Diastology LVIDs:         2.40 cm  LV e' medial:    6.74 cm/s LV PW:         0.80 cm  LV E/e' medial:  18.2 LV IVS:        1.10 cm  LV e' lateral:   8.49 cm/s LVOT diam:     1.90 cm  LV E/e' lateral: 14.5 LV SV:         95 LV SV Index:   66 LVOT Area:     2.84 cm  RIGHT VENTRICLE RV S prime:     15.90 cm/s TAPSE (M-mode): 2.2 cm LEFT ATRIUM             Index       RIGHT ATRIUM           Index LA diam:        2.70 cm 1.88 cm/m  RA Area:     12.80 cm LA Vol (A2C):   58.7 ml 40.93 ml/m RA Volume:   30.80 ml  21.47 ml/m LA Vol (A4C):   42.8 ml 29.84 ml/m LA Biplane Vol: 50.3 ml 35.07 ml/m  AORTIC VALVE AV Area (Vmax):    2.21 cm AV Area (Vmean):   2.39 cm AV Area (VTI):     2.26 cm AV Vmax:           222.00 cm/s AV Vmean:          127.000 cm/s AV VTI:            0.421 m AV Peak Grad:      19.7 mmHg AV Mean Grad:      8.0 mmHg LVOT Vmax:         173.00 cm/s LVOT Vmean:        107.000 cm/s LVOT VTI:          0.335 m LVOT/AV VTI ratio: 0.80  AORTA Ao Root diam: 3.10 cm MITRAL VALVE MV Area (PHT): 3.27 cm     SHUNTS MV Mean grad:  4.0 mmHg     Systemic VTI:  0.34 m MV Decel Time: 232 msec     Systemic Diam: 1.90 cm MV E velocity: 123.00 cm/s MV A velocity: 145.00 cm/s MV E/A ratio:  0.85 Rudean Haskell MD Electronically signed by Rudean Haskell MD Signature  Date/Time: 10/05/2020/4:08:43 PM    Final    US Abdomen Limited RUQ (LIVER/GB)  Result Date: 10/05/2020 CLINICAL DATA:  Transaminitis EXAM: ULTRASOUND ABDOMEN LIMITED RIGHT UPPER QUADRANT COMPARISON:  CT abdomen and pelvis dated October 30, 2012 FINDINGS: Gallbladder: Gallbladder is surgically absent. Common bile duct: Diameter: 7 mm Liver: No focal lesion identified. Heterogeneous echotexture. Portal vein is patent on color Doppler imaging with normal direction of blood flow towards the liver. Other: Right pleural effusion. IMPRESSION: Mildly dilated common bile duct which can be seen status post cholecystectomy. Small right pleural effusion. Electronically Signed   By: Yetta Glassman M.D.   On: 10/05/2020 11:04    Labs: BMET Recent Labs  Lab 10/01/2020 1908 10/11/2020 1938 10/03/20 0232 10/03/20 0407 10/03/20 [id number] 10/03/20 0615 10/03/20 [id number] 10/03/20 1047 10/04/20 0340 10/05/20 0348 10/05/20 0757 10/05/20 1629 10/06/20 0451  NA 134*   < > 136   < > 136 140 142 139 140 138 139 139  --   K 4.4   < > 5.2*   < > 5.6* 5.5* 5.1 4.5 3.6 4.9 4.3 4.3  --   CL 106  --  109  --  110  --   --  109 102 103  --  103  --   CO2 19*  --  18*  --  17*  --   --  21* 26 26  --  24  --   GLUCOSE 203*  --  167*  --  165*  --   --  162* 115* 120*  --  131*  --   BUN 14  --  18  --  18  --   --  20 24* 39*  --  45*  --   CREATININE 1.02*  --  1.46*  --  1.72*  --   --  1.88* 2.54* 3.57*  --  3.81*  --   CALCIUM 7.3*  --  7.4*  --  7.0*  --   --  6.8* 7.1* 7.0*  --  7.0*  --   PHOS  --   --   --   --   --   --   --  4.7*  --  6.5*  --  6.0* 5.2*   < > = values in this interval not displayed.   CBC Recent Labs  Lab 10/20/2020 1226 10/17/2020 1844 10/04/20 1203 10/05/20 0348 10/05/20 0757 10/05/20 1629 10/06/20 0451  WBC 8.5   < > 23.0* 24.7*  --  24.7* 28.0*  NEUTROABS 5.6  --   --   --   --   --   --   HGB 10.6*   < > 9.5* 9.5* 8.5* 8.9* 8.8*  HCT 34.6*   < > 28.6* 29.6* 25.0* 28.4* 26.7*  MCV  86.9   < > 86.7 90.8  --  92.5 91.8  PLT 243   < > 123* 120*  --  113* 120*   < > = values in this interval not displayed.    Medications:     sodium chloride   Intravenous Once   arformoterol  15 mcg Nebulization BID   budesonide (PULMICORT) nebulizer solution  0.25 mg Nebulization BID   calcium carbonate  1 tablet Oral Daily   chlorhexidine  15 mL Mouth Rinse BID   Chlorhexidine Gluconate Cloth  6 each Topical Daily   heparin injection (subcutaneous)  5,000 Units Subcutaneous Q8H   insulin aspart  0-9 Units Subcutaneous Q4H   levothyroxine  50 mcg Intravenous Daily   mouth rinse  15 mL Mouth Rinse q12n4p   pantoprazole (PROTONIX) IV  40 mg Intravenous QHS   sodium chloride flush  10-40 mL Intracatheter Q12H   Elmarie Shiley, MD 10/06/2020, 6:59 AM

## 2020-10-07 ENCOUNTER — Inpatient Hospital Stay (HOSPITAL_COMMUNITY): Payer: Medicare HMO

## 2020-10-07 DIAGNOSIS — J9601 Acute respiratory failure with hypoxia: Secondary | ICD-10-CM | POA: Diagnosis not present

## 2020-10-07 DIAGNOSIS — B49 Unspecified mycosis: Secondary | ICD-10-CM

## 2020-10-07 DIAGNOSIS — R579 Shock, unspecified: Secondary | ICD-10-CM | POA: Diagnosis not present

## 2020-10-07 DIAGNOSIS — T827XXD Infection and inflammatory reaction due to other cardiac and vascular devices, implants and grafts, subsequent encounter: Secondary | ICD-10-CM | POA: Diagnosis not present

## 2020-10-07 DIAGNOSIS — Z515 Encounter for palliative care: Secondary | ICD-10-CM | POA: Diagnosis not present

## 2020-10-07 DIAGNOSIS — Z66 Do not resuscitate: Secondary | ICD-10-CM

## 2020-10-07 DIAGNOSIS — Z7189 Other specified counseling: Secondary | ICD-10-CM

## 2020-10-07 LAB — HEPATIC FUNCTION PANEL
ALT: 572 U/L — ABNORMAL HIGH (ref 0–44)
AST: 242 U/L — ABNORMAL HIGH (ref 15–41)
Albumin: 1.5 g/dL — ABNORMAL LOW (ref 3.5–5.0)
Alkaline Phosphatase: 209 U/L — ABNORMAL HIGH (ref 38–126)
Bilirubin, Direct: 0.6 mg/dL — ABNORMAL HIGH (ref 0.0–0.2)
Indirect Bilirubin: 0.5 mg/dL (ref 0.3–0.9)
Total Bilirubin: 1.1 mg/dL (ref 0.3–1.2)
Total Protein: 4.3 g/dL — ABNORMAL LOW (ref 6.5–8.1)

## 2020-10-07 LAB — POCT I-STAT 7, (LYTES, BLD GAS, ICA,H+H)
Acid-Base Excess: 14 mmol/L — ABNORMAL HIGH (ref 0.0–2.0)
Acid-base deficit: 2 mmol/L (ref 0.0–2.0)
Acid-base deficit: 1 mmol/L (ref 0.0–2.0)
Bicarbonate: 26.7 mmol/L (ref 20.0–28.0)
Bicarbonate: 27.5 mmol/L (ref 20.0–28.0)
Bicarbonate: 39.1 mmol/L — ABNORMAL HIGH (ref 20.0–28.0)
Calcium, Ion: 1.05 mmol/L — ABNORMAL LOW (ref 1.15–1.40)
Calcium, Ion: 1.03 mmol/L — ABNORMAL LOW (ref 1.15–1.40)
Calcium, Ion: 0.94 mmol/L — ABNORMAL LOW (ref 1.15–1.40)
HCT: 30 % — ABNORMAL LOW (ref 36.0–46.0)
HCT: 27 % — ABNORMAL LOW (ref 36.0–46.0)
HCT: 24 % — ABNORMAL LOW (ref 36.0–46.0)
Hemoglobin: 10.2 g/dL — ABNORMAL LOW (ref 12.0–15.0)
Hemoglobin: 9.2 g/dL — ABNORMAL LOW (ref 12.0–15.0)
Hemoglobin: 8.2 g/dL — ABNORMAL LOW (ref 12.0–15.0)
O2 Saturation: 77 %
O2 Saturation: 100 %
O2 Saturation: 100 %
Patient temperature: 95.1
Patient temperature: 35.2
Patient temperature: 34.9
Potassium: 4.1 mmol/L (ref 3.5–5.1)
Potassium: 4.4 mmol/L (ref 3.5–5.1)
Potassium: 4.9 mmol/L (ref 3.5–5.1)
Sodium: 138 mmol/L (ref 135–145)
Sodium: 136 mmol/L (ref 135–145)
Sodium: 143 mmol/L (ref 135–145)
TCO2: 29 mmol/L (ref 22–32)
TCO2: 30 mmol/L (ref 22–32)
TCO2: 41 mmol/L — ABNORMAL HIGH (ref 22–32)
pCO2 arterial: 61.3 mmHg — ABNORMAL HIGH (ref 32.0–48.0)
pCO2 arterial: 66 mmHg (ref 32.0–48.0)
pCO2 arterial: 50.1 mmHg — ABNORMAL HIGH (ref 32.0–48.0)
pH, Arterial: 7.236 — ABNORMAL LOW (ref 7.350–7.450)
pH, Arterial: 7.218 — ABNORMAL LOW (ref 7.350–7.450)
pH, Arterial: 7.492 — ABNORMAL HIGH (ref 7.350–7.450)
pO2, Arterial: 45 mmHg — ABNORMAL LOW (ref 83.0–108.0)
pO2, Arterial: 238 mmHg — ABNORMAL HIGH (ref 83.0–108.0)
pO2, Arterial: 318 mmHg — ABNORMAL HIGH (ref 83.0–108.0)

## 2020-10-07 LAB — RENAL FUNCTION PANEL
Albumin: 1.6 g/dL — ABNORMAL LOW (ref 3.5–5.0)
Albumin: 1.6 g/dL — ABNORMAL LOW (ref 3.5–5.0)
Anion gap: 6 (ref 5–15)
Anion gap: 9 (ref 5–15)
BUN: 26 mg/dL — ABNORMAL HIGH (ref 8–23)
BUN: 20 mg/dL (ref 8–23)
CO2: 26 mmol/L (ref 22–32)
CO2: 23 mmol/L (ref 22–32)
Calcium: 7.3 mg/dL — ABNORMAL LOW (ref 8.9–10.3)
Calcium: 7.3 mg/dL — ABNORMAL LOW (ref 8.9–10.3)
Chloride: 104 mmol/L (ref 98–111)
Chloride: 102 mmol/L (ref 98–111)
Creatinine, Ser: 1.94 mg/dL — ABNORMAL HIGH (ref 0.44–1.00)
Creatinine, Ser: 1.3 mg/dL — ABNORMAL HIGH (ref 0.44–1.00)
GFR, Estimated: 26 mL/min — ABNORMAL LOW (ref >60)
GFR, Estimated: 43 mL/min — ABNORMAL LOW (ref >60)
Glucose, Bld: 169 mg/dL — ABNORMAL HIGH (ref 70–99)
Glucose, Bld: 206 mg/dL — ABNORMAL HIGH (ref 70–99)
Phosphorus: 2.2 mg/dL — ABNORMAL LOW (ref 2.5–4.6)
Phosphorus: 2 mg/dL — ABNORMAL LOW (ref 2.5–4.6)
Potassium: 4.2 mmol/L (ref 3.5–5.1)
Potassium: 5.1 mmol/L (ref 3.5–5.1)
Sodium: 136 mmol/L (ref 135–145)
Sodium: 134 mmol/L — ABNORMAL LOW (ref 135–145)

## 2020-10-07 LAB — CBC
HCT: 29.8 % — ABNORMAL LOW (ref 36.0–46.0)
HCT: 27.6 % — ABNORMAL LOW (ref 36.0–46.0)
HCT: 30.6 % — ABNORMAL LOW (ref 36.0–46.0)
Hemoglobin: 9.3 g/dL — ABNORMAL LOW (ref 12.0–15.0)
Hemoglobin: 8.7 g/dL — ABNORMAL LOW (ref 12.0–15.0)
Hemoglobin: 9.6 g/dL — ABNORMAL LOW (ref 12.0–15.0)
MCH: 29.2 pg (ref 26.0–34.0)
MCH: 29 pg (ref 26.0–34.0)
MCH: 29.4 pg (ref 26.0–34.0)
MCHC: 31.2 g/dL (ref 30.0–36.0)
MCHC: 31.5 g/dL (ref 30.0–36.0)
MCHC: 31.4 g/dL (ref 30.0–36.0)
MCV: 93.4 fL (ref 80.0–100.0)
MCV: 92 fL (ref 80.0–100.0)
MCV: 93.6 fL (ref 80.0–100.0)
Platelets: 116 10*3/uL — ABNORMAL LOW (ref 150–400)
Platelets: 103 10*3/uL — ABNORMAL LOW (ref 150–400)
Platelets: 126 10*3/uL — ABNORMAL LOW (ref 150–400)
RBC: 3.19 MIL/uL — ABNORMAL LOW (ref 3.87–5.11)
RBC: 3 MIL/uL — ABNORMAL LOW (ref 3.87–5.11)
RBC: 3.27 MIL/uL — ABNORMAL LOW (ref 3.87–5.11)
RDW: 17.1 % — ABNORMAL HIGH (ref 11.5–15.5)
RDW: 17.2 % — ABNORMAL HIGH (ref 11.5–15.5)
RDW: 17.2 % — ABNORMAL HIGH (ref 11.5–15.5)
WBC: 36.6 10*3/uL — ABNORMAL HIGH (ref 4.0–10.5)
WBC: 42.9 10*3/uL — ABNORMAL HIGH (ref 4.0–10.5)
WBC: 44 10*3/uL — ABNORMAL HIGH (ref 4.0–10.5)
nRBC: 0.4 % — ABNORMAL HIGH (ref 0.0–0.2)
nRBC: 0.7 % — ABNORMAL HIGH (ref 0.0–0.2)
nRBC: 1 % — ABNORMAL HIGH (ref 0.0–0.2)

## 2020-10-07 LAB — BASIC METABOLIC PANEL
Anion gap: 8 (ref 5–15)
BUN: 20 mg/dL (ref 8–23)
CO2: 31 mmol/L (ref 22–32)
Calcium: 7.1 mg/dL — ABNORMAL LOW (ref 8.9–10.3)
Chloride: 101 mmol/L (ref 98–111)
Creatinine, Ser: 1.5 mg/dL — ABNORMAL HIGH (ref 0.44–1.00)
GFR, Estimated: 36 mL/min — ABNORMAL LOW (ref >60)
Glucose, Bld: 236 mg/dL — ABNORMAL HIGH (ref 70–99)
Potassium: 5 mmol/L (ref 3.5–5.1)
Sodium: 140 mmol/L (ref 135–145)

## 2020-10-07 LAB — AEROBIC/ANAEROBIC CULTURE W GRAM STAIN (SURGICAL/DEEP WOUND)
Culture: NO GROWTH
Culture: NO GROWTH

## 2020-10-07 LAB — GLUCOSE, CAPILLARY
Glucose-Capillary: 161 mg/dL — ABNORMAL HIGH (ref 70–99)
Glucose-Capillary: 146 mg/dL — ABNORMAL HIGH (ref 70–99)
Glucose-Capillary: 181 mg/dL — ABNORMAL HIGH (ref 70–99)
Glucose-Capillary: 172 mg/dL — ABNORMAL HIGH (ref 70–99)
Glucose-Capillary: 207 mg/dL — ABNORMAL HIGH (ref 70–99)

## 2020-10-07 LAB — PROTIME-INR
INR: 1.3 — ABNORMAL HIGH (ref 0.8–1.2)
Prothrombin Time: 16 seconds — ABNORMAL HIGH (ref 11.4–15.2)

## 2020-10-07 LAB — TYPE AND SCREEN
ABO/RH(D): O POS
Antibody Screen: NEGATIVE

## 2020-10-07 LAB — TRIGLYCERIDES: Triglycerides: 253 mg/dL — ABNORMAL HIGH (ref <150)

## 2020-10-07 LAB — MAGNESIUM: Magnesium: 2.2 mg/dL (ref 1.7–2.4)

## 2020-10-07 MED ORDER — PROPOFOL 1000 MG/100ML IV EMUL
5.0000 ug/kg/min | INTRAVENOUS | Status: DC
  Administered 2020-10-07: 20 ug/kg/min via INTRAVENOUS
  Administered 2020-10-07: 40 ug/kg/min via INTRAVENOUS
  Filled 2020-10-07 (×2): qty 100

## 2020-10-07 MED ORDER — ORAL CARE MOUTH RINSE
15.0000 mL | OROMUCOSAL | Status: DC
  Administered 2020-10-07 – 2020-10-08 (×9): 15 mL via OROMUCOSAL

## 2020-10-07 MED ORDER — DEXTROSE 5 % IV SOLN
15.0000 mmol | Freq: Once | INTRAVENOUS | Status: AC
  Administered 2020-10-07: 15 mmol via INTRAVENOUS
  Filled 2020-10-07: qty 5

## 2020-10-07 MED ORDER — SODIUM CHLORIDE 0.9 % IV SOLN
100.0000 mg | INTRAVENOUS | Status: DC
  Filled 2020-10-07: qty 100

## 2020-10-07 MED ORDER — KETAMINE HCL 50 MG/5ML IJ SOSY
PREFILLED_SYRINGE | INTRAMUSCULAR | Status: AC
  Filled 2020-10-07: qty 5

## 2020-10-07 MED ORDER — EPINEPHRINE 1 MG/10ML IJ SOSY
PREFILLED_SYRINGE | INTRAMUSCULAR | Status: AC
  Administered 2020-10-07: 0.2 mg via INTRAVENOUS
  Filled 2020-10-07: qty 10

## 2020-10-07 MED ORDER — ETOMIDATE 2 MG/ML IV SOLN
INTRAVENOUS | Status: AC
  Filled 2020-10-07: qty 20

## 2020-10-07 MED ORDER — TRACE MINERALS CU-MN-SE-ZN 300-55-60-3000 MCG/ML IV SOLN
INTRAVENOUS | Status: DC
  Filled 2020-10-07: qty 624

## 2020-10-07 MED ORDER — VASOPRESSIN 20 UNITS/100 ML INFUSION FOR SHOCK
0.0000 [IU]/min | INTRAVENOUS | Status: DC
  Administered 2020-10-07 (×3): 0.03 [IU]/min via INTRAVENOUS
  Filled 2020-10-07 (×2): qty 100

## 2020-10-07 MED ORDER — CHLORHEXIDINE GLUCONATE 0.12% ORAL RINSE (MEDLINE KIT)
15.0000 mL | Freq: Two times a day (BID) | OROMUCOSAL | Status: DC
  Administered 2020-10-07 – 2020-10-08 (×3): 15 mL via OROMUCOSAL

## 2020-10-07 MED ORDER — FENTANYL CITRATE (PF) 100 MCG/2ML IJ SOLN
INTRAMUSCULAR | Status: AC
  Filled 2020-10-07: qty 2

## 2020-10-07 MED ORDER — NOREPINEPHRINE 4 MG/250ML-% IV SOLN
INTRAVENOUS | Status: AC
  Filled 2020-10-07: qty 250

## 2020-10-07 MED ORDER — EPINEPHRINE 1 MG/10ML IJ SOSY: 0.2000 mg | PREFILLED_SYRINGE | Freq: Once | INTRAMUSCULAR | Status: AC

## 2020-10-07 MED ORDER — PHENYLEPHRINE 40 MCG/ML (10ML) SYRINGE FOR IV PUSH (FOR BLOOD PRESSURE SUPPORT)
PREFILLED_SYRINGE | INTRAVENOUS | Status: AC
  Filled 2020-10-07: qty 10

## 2020-10-07 MED ORDER — FENTANYL CITRATE (PF) 100 MCG/2ML IJ SOLN
50.0000 ug | Freq: Once | INTRAMUSCULAR | Status: AC
  Administered 2020-10-07: 50 ug via INTRAVENOUS
  Filled 2020-10-07: qty 2

## 2020-10-07 MED ORDER — SODIUM CHLORIDE 0.9 % IV SOLN
200.0000 mg | Freq: Once | INTRAVENOUS | Status: AC
  Administered 2020-10-07: 200 mg via INTRAVENOUS
  Filled 2020-10-07: qty 200

## 2020-10-07 MED ORDER — SODIUM PHOSPHATES 45 MMOLE/15ML IV SOLN
20.0000 mmol | Freq: Once | INTRAVENOUS | Status: AC
  Administered 2020-10-07: 20 mmol via INTRAVENOUS
  Filled 2020-10-07: qty 6.67

## 2020-10-07 MED ORDER — NOREPINEPHRINE 16 MG/250ML-% IV SOLN
0.0000 ug/min | INTRAVENOUS | Status: DC
  Administered 2020-10-07: 22 ug/min via INTRAVENOUS
  Administered 2020-10-07: 2 ug/min via INTRAVENOUS
  Filled 2020-10-07 (×3): qty 250

## 2020-10-07 MED ORDER — MIDAZOLAM HCL 2 MG/2ML IJ SOLN
INTRAMUSCULAR | Status: AC
  Filled 2020-10-07: qty 2

## 2020-10-07 MED ORDER — PROPOFOL 1000 MG/100ML IV EMUL
INTRAVENOUS | Status: AC
  Administered 2020-10-07: 5 ug/kg/min via INTRAVENOUS
  Filled 2020-10-07: qty 100

## 2020-10-07 MED ORDER — ROCURONIUM BROMIDE 10 MG/ML (PF) SYRINGE
PREFILLED_SYRINGE | INTRAVENOUS | Status: AC
  Filled 2020-10-07: qty 10

## 2020-10-07 NOTE — Progress Notes (Signed)
Palliative:  Brief summary of goals of care discussion - Full note to follow.   Met with patient's 2 daughter's Wells Guiles and Jackelyn Poling. Discussed situation, multiorgan failure, and poor prognosis. Daughters understand. We discussed code status change to DNR and they agreed to this. Discussed continuing current measures for 24 hours and reassess tomorrow. With further decline daughters would be open to a shift to full comfort measures. Both daughters state multiple times that they do not want patient to suffer or have situation prolonged if there is very little hope for full recovery to her baseline.   Juel Burrow, DNP, AGNP-C Palliative Medicine Team Team Phone # [phone]  Pager # [phone]  NO CHARGE

## 2020-10-07 NOTE — Progress Notes (Signed)
RN added NRB 15L to the already going 15L HFNC in hopes to bring up patients paO2 level of 45 noted on previous gas.

## 2020-10-07 NOTE — Progress Notes (Signed)
[facility] Progress Note Patient Name: [name] DOB: 04-Aug-1945 MRN: [id number]   Date of Service  10/07/2020  HPI/Events of Note  MAP 71, Heart rate 64.  eICU Interventions  Continue current Rx.        Kerry Kass [name] 10/07/2020, 4:17 AM

## 2020-10-07 NOTE — Progress Notes (Addendum)
5 Days Post-Op   Subjective/Chief Complaint: Events from overnight noted.  On vent this am.  Oldest daughter at the bedside.  On levo and vaso, off neo  Objective: Vital signs in last 24 hours: Temp:  [95.1 F (35.1 C)-98.8 F (37.1 C)] 95.2 F (35.1 C) (08/17 0700) Pulse Rate:  [48-149] 52 (08/17 0804) Resp:  [13-31] 25 (08/17 0804) BP: (76-184)/(38-140) 127/45 (08/17 0804) SpO2:  [65 %-100 %] 100 % (08/17 0804) Arterial Line BP: (70-168)/(37-67) 146/46 (08/17 0700) FiO2 (%):  [60 %-100 %] 60 % (08/17 0805) Weight:  [58.1 kg] 58.1 kg (08/17 0500) Last BM Date: 10/05/20  Intake/Output from previous day: 08/16 0701 - 08/17 0700 In: 2573.1 [I.V.:2063; IV Piggyback:510.1] Out: 4939 [Urine:45; Emesis/NG output:250; Drains:121] Intake/Output this shift: No intake/output data recorded.  Abd: inc c/d/I, JP bloody, doesn't seem tender, but on vent  Lab Results:  Recent Labs    10/06/20 1556 10/06/20 2128 10/07/20 0309 10/07/20 0324 10/07/20 0641  WBC 28.3*  --  36.6*  --   --   HGB 9.3*   < > 9.3* 10.2* 9.2*  HCT 28.8*   < > 29.8* 30.0* 27.0*  PLT 113*  --  116*  --   --    < > = values in this interval not displayed.   BMET Recent Labs    10/06/20 1556 10/06/20 2128 10/07/20 0309 10/07/20 0324 10/07/20 0641  NA 139   < > 136 138 136  K 4.4   < > 4.2 4.1 4.4  CL 105  --  104  --   --   CO2 23  --  26  --   --   GLUCOSE 131*  --  169*  --   --   BUN 38*  --  26*  --   --   CREATININE 2.96*  --  1.94*  --   --   CALCIUM 7.3*  --  7.3*  --   --    < > = values in this interval not displayed.   PT/INR Recent Labs    10/06/20 0451 10/07/20 0309  LABPROT 17.4* 16.0*  INR 1.4* 1.3*   ABG Recent Labs    10/07/20 0324 10/07/20 0641  PHART 7.236* 7.218*  HCO3 26.7 27.5    Studies/Results: DG Chest Port 1 View  Result Date: 10/05/2020 CLINICAL DATA:  Post central line placement EXAM: PORTABLE CHEST 1 VIEW COMPARISON:  10/04/2020 FINDINGS: New left IJ  double-lumen line tip overlies central SVC. Right IJ central line overlies SVC. Enteric tube passes below the diaphragm with tip out of field of view. Endotracheal tube is no longer present. Minor bibasilar atelectasis. No pneumothorax. Stable mild interstitial prominence. Stable cardiomediastinal contours. IMPRESSION: New left IJ central line tip overlies SVC.  No pneumothorax. Mild bibasilar atelectasis. Electronically Signed   By: Macy Mis M.D.   On: 10/05/2020 15:26   ECHOCARDIOGRAM COMPLETE  Result Date: 10/05/2020    ECHOCARDIOGRAM REPORT   Patient Name:   [name] Date of Exam: 10/05/2020 Medical Rec #:  [id number]      Height:       59.0 in Accession #:    [id number]     Weight:       110.7 lb Date of Birth:  Jul 03, 1945     BSA:          1.434 m Patient Age:    57 years       BP:  131/62 mmHg Patient Gender: F              HR:           71 bpm. Exam Location:  Inpatient Procedure: 2D Echo, Color Doppler and Cardiac Doppler Indications:    I42.80 Non-ischemic cardiomyopathy  History:        Patient has no prior history of Echocardiogram examinations.                 COPD; Risk Factors:Dyslipidemia and Hypertension.  Sonographer:    Raquel Sarna Senior RDCS Referring Phys: [id number] [place]  1. Left ventricular ejection fraction, by estimation, is 60 to 65%. The left ventricle has normal function. The left ventricle has no regional wall motion abnormalities. Left ventricular diastolic parameters are consistent with Grade II diastolic dysfunction (pseudonormalization).  2. Right ventricular systolic function is normal. The right ventricular size is normal. Tricuspid regurgitation signal is inadequate for assessing PA pressure.  3. Left atrial size was mildly dilated.  4. The mitral valve is normal in structure. No evidence of mitral valve regurgitation. No evidence of mitral stenosis. The mean mitral valve gradient is 4.0 mmHg with average heart rate of 76 bpm.  5. The aortic  valve was not well visualized. Aortic valve regurgitation is mild. Mild to moderate aortic valve sclerosis/calcification is present, without any evidence of aortic stenosis.  6. The inferior vena cava is normal in size with greater than 50% respiratory variability, suggesting right atrial pressure of 3 mmHg.  7. Increased pulse wave Spectral Doppler near the takeoff of the left common carotid. Consider dedicated study for assessment of carotid artery stenosis if clinically indicated. Comparison(s): No prior Echocardiogram. FINDINGS  Left Ventricle: Left ventricular ejection fraction, by estimation, is 60 to 65%. The left ventricle has normal function. The left ventricle has no regional wall motion abnormalities. The left ventricular internal cavity size was small. There is no left ventricular hypertrophy of the basal-septal segment. Left ventricular diastolic parameters are consistent with Grade II diastolic dysfunction (pseudonormalization). Right Ventricle: The right ventricular size is normal. No increase in right ventricular wall thickness. Right ventricular systolic function is normal. Tricuspid regurgitation signal is inadequate for assessing PA pressure. Left Atrium: Left atrial size was mildly dilated. Right Atrium: Right atrial size was normal in size. Pericardium: There is no evidence of pericardial effusion. Mitral Valve: The mitral valve is normal in structure. No evidence of mitral valve regurgitation. No evidence of mitral valve stenosis. The mean mitral valve gradient is 4.0 mmHg with average heart rate of 76 bpm. Tricuspid Valve: The tricuspid valve is normal in structure. Tricuspid valve regurgitation is trivial. No evidence of tricuspid stenosis. Aortic Valve: The aortic valve was not well visualized. Aortic valve regurgitation is mild. Mild to moderate aortic valve sclerosis/calcification is present, without any evidence of aortic stenosis. Aortic valve mean gradient measures 8.0 mmHg. Aortic  valve peak gradient measures 19.7 mmHg. Aortic valve area, by VTI measures 2.26 cm. Pulmonic Valve: The pulmonic valve was grossly normal. Pulmonic valve regurgitation is not visualized. No evidence of pulmonic stenosis. Aorta: The aortic root is normal in size and structure. Venous: The inferior vena cava is normal in size with greater than 50% respiratory variability, suggesting right atrial pressure of 3 mmHg. IAS/Shunts: The atrial septum is grossly normal.  LEFT VENTRICLE PLAX 2D LVIDd:         3.70 cm  Diastology LVIDs:         2.40 cm  LV  e' medial:    6.74 cm/s LV PW:         0.80 cm  LV E/e' medial:  18.2 LV IVS:        1.10 cm  LV e' lateral:   8.49 cm/s LVOT diam:     1.90 cm  LV E/e' lateral: 14.5 LV SV:         95 LV SV Index:   66 LVOT Area:     2.84 cm  RIGHT VENTRICLE RV S prime:     15.90 cm/s TAPSE (M-mode): 2.2 cm LEFT ATRIUM             Index       RIGHT ATRIUM           Index LA diam:        2.70 cm 1.88 cm/m  RA Area:     12.80 cm LA Vol (A2C):   58.7 ml 40.93 ml/m RA Volume:   30.80 ml  21.47 ml/m LA Vol (A4C):   42.8 ml 29.84 ml/m LA Biplane Vol: 50.3 ml 35.07 ml/m  AORTIC VALVE AV Area (Vmax):    2.21 cm AV Area (Vmean):   2.39 cm AV Area (VTI):     2.26 cm AV Vmax:           222.00 cm/s AV Vmean:          127.000 cm/s AV VTI:            0.421 m AV Peak Grad:      19.7 mmHg AV Mean Grad:      8.0 mmHg LVOT Vmax:         173.00 cm/s LVOT Vmean:        107.000 cm/s LVOT VTI:          0.335 m LVOT/AV VTI ratio: 0.80  AORTA Ao Root diam: 3.10 cm MITRAL VALVE MV Area (PHT): 3.27 cm     SHUNTS MV Mean grad:  4.0 mmHg     Systemic VTI:  0.34 m MV Decel Time: 232 msec     Systemic Diam: 1.90 cm MV E velocity: 123.00 cm/s MV A velocity: 145.00 cm/s MV E/A ratio:  0.85 Rudean Haskell MD Electronically signed by Rudean Haskell MD Signature Date/Time: 10/05/2020/4:08:43 PM    Final    US Abdomen Limited RUQ (LIVER/GB)  Result Date: 10/05/2020 CLINICAL DATA:  Transaminitis  EXAM: ULTRASOUND ABDOMEN LIMITED RIGHT UPPER QUADRANT COMPARISON:  CT abdomen and pelvis dated October 30, 2012 FINDINGS: Gallbladder: Gallbladder is surgically absent. Common bile duct: Diameter: 7 mm Liver: No focal lesion identified. Heterogeneous echotexture. Portal vein is patent on color Doppler imaging with normal direction of blood flow towards the liver. Other: Right pleural effusion. IMPRESSION: Mildly dilated common bile duct which can be seen status post cholecystectomy. Small right pleural effusion. Electronically Signed   By: Yetta Glassman M.D.   On: 10/05/2020 11:04    Anti-infectives: Anti-infectives (From admission, onward)    Start     Dose/Rate Route Frequency Ordered Stop   10/06/20 2200  ceFEPIme (MAXIPIME) 2 g in sodium chloride 0.9 % 100 mL IVPB        2 g 200 mL/hr over 30 Minutes Intravenous Every 12 hours 10/06/20 1615     10/06/20 2000  DAPTOmycin (CUBICIN) 400 mg in sodium chloride 0.9 % IVPB        8 mg/kg  50.2 kg 116 mL/hr over 30 Minutes Intravenous Every 48 hours 10/05/20 0944  10/06/20 1715  metroNIDAZOLE (FLAGYL) IVPB 500 mg        500 mg 100 mL/hr over 60 Minutes Intravenous Every 12 hours 10/06/20 1616     10/06/20 1000  ceFEPIme (MAXIPIME) 2 g in sodium chloride 0.9 % 100 mL IVPB  Status:  Discontinued        2 g 200 mL/hr over 30 Minutes Intravenous Every 12 hours 10/06/20 0948 10/06/20 1611   10/05/20 1045  metroNIDAZOLE (FLAGYL) IVPB 500 mg  Status:  Discontinued        500 mg 100 mL/hr over 60 Minutes Intravenous Every 12 hours 10/05/20 0946 10/06/20 1611   10/04/20 2130  vancomycin (VANCOREADY) IVPB 750 mg/150 mL        750 mg 150 mL/hr over 60 Minutes Intravenous  Once 10/04/20 2041 10/04/20 2331   10/04/20 1021  vancomycin variable dose per unstable renal function (pharmacist dosing)  Status:  Discontinued         Does not apply See admin instructions 10/04/20 1021 10/05/20 0937   10/03/20 1000  ceFEPIme (MAXIPIME) 2 g in sodium chloride  0.9 % 100 mL IVPB  Status:  Discontinued        2 g 200 mL/hr over 30 Minutes Intravenous Every 24 hours 10/03/20 0831 10/06/20 0948   10/01/20 0800  cefTRIAXone (ROCEPHIN) 2 g in sodium chloride 0.9 % 100 mL IVPB  Status:  Discontinued        2 g 200 mL/hr over 30 Minutes Intravenous Every 24 hours 10/01/20 0700 10/03/20 0754   09/30/20 1830  vancomycin (VANCOREADY) IVPB 500 mg/100 mL  Status:  Discontinued        500 mg 100 mL/hr over 60 Minutes Intravenous Every 24 hours 10/16/2020 2245 10/04/20 1021   09/28/2020 2345  ceFEPIme (MAXIPIME) 2 g in sodium chloride 0.9 % 100 mL IVPB  Status:  Discontinued        2 g 200 mL/hr over 30 Minutes Intravenous Every 12 hours 09/26/2020 2250 10/01/20 0653   10/18/2020 1731  vancomycin (VANCOCIN) 1-5 GM/200ML-% IVPB       Note to Pharmacy: Cameron Sprang   : cabinet override      10/11/2020 1731 09/30/20 0544       Assessment/Plan: POD 5, s/p removal infected aortic graft, extraanatomic bypass, duodenal serosal tear -care per vascular surgery and ccm -drain near duodenum without bile and remains bloody -WBC trending up, but do not suspect bowel injury or bowel leak as etiology given drains without bilious output. -continue ng tube -cont TNA at this time given set back and limited bowel function as well on multiple pressors   LOS: 8 days    Henreitta Cea 10/07/2020

## 2020-10-07 NOTE — Progress Notes (Signed)
PT Cancellation Note  Patient Details Name: DHANI CICHOSZ MRN: [id number] DOB: 07-01-1945   Cancelled Treatment:    Reason Eval/Treat Not Completed: Patient not medically ready.  BP's are too labile per RN. 10/07/2020  Ginger Carne., PT Acute Rehabilitation Services [phone]  (pager) [phone]  (office)   [name] Ramar Nobrega 10/07/2020, 2:36 PM

## 2020-10-07 NOTE — Progress Notes (Signed)
NAME:  [name], MRN:  [id number], DOB:  [birth year], LOS: 8 ADMISSION DATE:  10/16/2020, CONSULTATION DATE:  8/12 REFERRING MD:  Dr. Oneida Alar, CHIEF COMPLAINT:  removal of infected aorta bi-femoral bypass graft; placement of R axillay BI-FEMORAL BYPASS GRAFT and LEFT AXILLARY POPLITEAL BYPASS GRAFT   History of Present Illness:  Patient is a 75 yo F w/ pertinent PMH of carotid artery occlusion, CVA, hyper otitis, HTN, HLD, hypothyroidism, IBS, hydronephrosis, COPD, MI, PVD, PAF, aortobifemoral bypass graft 10 years ago presents to [facility] on 10/16/2020 with left femoral stent bleeding and infection.  Patient has been on Omadacycline for 2 days prior to admission for infection and has recently noticed profuse bleeding from the site. Has had I&D of left groin in December 2021. Bleeding was stopped and EMS called to bring to [facility] on 8/9. Last dose of Eliquis for PAF on 8/8. Hgb and bp stable. Vascular surgery consulted. On 8/9 Status postrepair of left femoral anastomosis with bovine pericardial patch for bleeding surgery performed. 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 Started on Vanc/cefepime. Infectious disease consulted 8/10. On Vanc and ceftriaxone.  On 8/12, Vascular surgery  performed: REMOVAL OF AORTA BI-FEMORAL BYPASS GRAFT, placed RIGTH AXILLARY BI-FEMORAL BYPASS GRAFT and LEFT AXILLARY POPLITEAL BYPASS GRAFT. There was small serosal injury to duodenum s/p procedure. General Surgery consulted. Patched with omentum and left NG tube with drain. Urology placed Right ureteroureterostomy with ureteral stent placement/exchange for complete mid ureteral transection. Patient arrived to ICU intubated.  PCCM consulted for management of mechanical ventilation and medical management.  Pertinent  Medical History   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   Cerebrovascular occlusion 2001   Extracranial with Hx of TIA's   Depression    Hepatitis     Hyperlipidemia    IBS (irritable bowel syndrome)    Myocardial infarction [facility])    Peripheral vascular disease ([place])    had Carotid En   Rheumatic fever    Stroke ([place])    no residual- except left side of face has a small "twist"   Thyroid disease    Hypothyroidism     Significant Hospital Events: Including procedures, antibiotic start and stop dates in addition to other pertinent events   8/12: pccm consulted for management of mechanical ventilation s/p surgery placement of RIGHT axillay BI-FEMORAL BYPASS GRAFT and LEFT AXILLARY POPLITEAL BYPASS GRAFT  8/14 extubated 8/17 intubated   Interim History / Subjective:   Intubated overnight. Now on pressors, mechanical life support, cvvhd   Objective   Blood pressure (!) 138/55, pulse (!) 53, temperature (!) 95.2 F (35.1 C), resp. rate 14, height 4' 11.02" (1.499 m), weight 58.1 kg, SpO2 96 %. CVP:  [1 mmHg-14 mmHg] 7 mmHg  Vent Mode: PRVC FiO2 (%):  [60 %-100 %] 60 % Set Rate:  [20 bmp-24 bmp] 24 bmp Vt Set:  [400 mL] 400 mL PEEP:  [8 cmH20] 8 cmH20 Plateau Pressure:  [24 cmH20-25 cmH20] 25 cmH20   Intake/Output Summary (Last 24 hours) at 10/07/2020 0941 Last data filed at 10/07/2020 0800 Gross per 24 hour  Intake 2428.56 ml  Output 5135 ml  Net -2706.44 ml   Filed Weights   09/30/20 0600 10/01/20 0500 10/07/20 0500  Weight: 48.2 kg 50.2 kg 58.1 kg    Examination: General: frail debilitated female, intubated on life support  HENT: ETT in place  Heart: RRR, s1 s2  Lung: CTAB, bilateral vented  breaths  Abd: soft, nt nd    Resolved Hospital Problem list   Acute respiratory failure with hypoxia s/p surgical procedure requiring mechanical ventilation Concern for RLL HAP  Assessment & Plan:  Aortobifemoral bypass graft infection, unknown organism Removal of infected aorta bi-fem bypass graft Right axillary bi-fem bypass graft Axillary popliteal bypass graft P:  Abx per ID Dapto, metronidazole and eraxis  Post-op  care per ccs and vascular surgery  Heparin dvt ppx  Small serosal injury to duodenum s/p repair: Patched with omentum and left NG tube with drain.   High risk for ileus given extensive vascular surgery.  Per surgery, would wait several days before initiating tube feeds given extent of surgery and need for laceration repair of duodenum.  No Cortrak without talking to general surgery> high risk for perforation given area of repair P: NPO  TPN continued   Right ureteroureterostomy 8/12 with ureteral stent placement/exchange for complete mid ureteral transection.  Creatinine and fluid from drain is similar to serum level, no leak. P: Foley in placed for a minimum of 10 days Ureteral stent for at least 6 weeks  She will need ongoing follow up with urology regarding this   Shock, improved, related to ABLA and fluid shifts post op; echo okay ABLA Rising white count P: Weaning from pressors as tolerated   Post op ATN/renal failure - CVVHD per nephrology   Hyperglycemia, prediabetes-controlled with minimal insulin P: SSI with cbgs   COPD without acute exacerbation - scheduled nebs   hypothyroidism - continued   H/o HTN Paroxysmal Afib -holding AC - on amiodarone ggt   HLD -holding statin   Shock liver - observe   Metabolic encephalopathy- related to ATN, shock liver and inability to clear sedating agents from surgery/postop: hopefully clears up with CRRT and supportive care; check ammonia for completeness sake - overall multi factorial  - avoid sedating meds   Abnormal carotid doppler on echo- check carotid doppler study when/if she recovers   Best Practice (right click and "Reselect all SmartList Selections" daily)   Diet/type: tpn DVT prophylaxis: heparin GI prophylaxis: PPI Lines: Central line and Arterial Line Foley:  Yes, and it is still needed Code Status:  full code Last date of multidisciplinary goals of care discussion per primary  This patient is  critically ill with multiple organ system failure; which, requires frequent high complexity decision making, assessment, support, evaluation, and titration of therapies. This was completed through the application of advanced monitoring technologies and extensive interpretation of multiple databases. During this encounter critical care time was devoted to patient care services described in this note for 32 minutes.   Garner Nash, DO [facility] 10/07/2020 9:41 AM

## 2020-10-07 NOTE — Progress Notes (Addendum)
[facility]  Subjective  - Intubated respiratory failure   Objective (!) 135/53 (!) 48 (!) 95.4 F (35.2 C) 19 100%  Intake/Output Summary (Last 24 hours) at 10/07/2020 0706 Last data filed at 10/07/2020 0700 Gross per 24 hour  Intake 2378.64 ml  Output 4939 ml  Net -2560.36 ml    Right axillary bypass pulse, feet warm without ischemic changes Right LQ drains in place OP 120 cc  Urine OP 45 cc NG tube in place 250 cc OP Lungs intubated this am On CRRT AKI Abdomin soft, incisions intact   Assessment/Planning: POD # 4 Procedure: 1.  Right axillary femoral bypass (8 mm dacron)  2.  Left axillary to superficial femoral artery bypass (8 mm ring supported PTFE)  3.  Removal of aortobifemoral bypass with oversewing of aortic stump just below the renal arteries  4.  Vein patch angioplasty left common femoral artery  On pressor support for hypotension Feet warm without ischemic changes  S/P EXPLORATORY LAPAROTOMY INSERTION OF 90F x 26cm CONTOUR STENT IN RIGHT URETER AND RIGHT URETEROURETEROSTOMY  TPN started for nutrition CRRT AKI Intubated respiratory failure. Acidotic PCO2 66 HGB stable 10.2 Prophylaxis Protonix and SQ Heparin. Continue Antibiotics recommendation per ID Supportive care Palliative may need to be consulted for goals of care     Roxy Horseman 10/07/2020 7:06 AM --  Laboratory Lab Results: Recent Labs    10/06/20 1556 10/06/20 2128 10/07/20 0309 10/07/20 0324 10/07/20 0641  WBC 28.3*  --  36.6*  --   --   HGB 9.3*   < > 9.3* 10.2* 9.2*  HCT 28.8*   < > 29.8* 30.0* 27.0*  PLT 113*  --  116*  --   --    < > = values in this interval not displayed.   BMET Recent Labs    10/06/20 1556 10/06/20 2128 10/07/20 0309 10/07/20 0324 10/07/20 0641  NA 139   < > 136 138 136  K 4.4   < > 4.2 4.1 4.4  CL 105  --  104  --   --   CO2 23  --  26  --   --   GLUCOSE 131*  --  169*  --   --   BUN 38*  --   26*  --   --   CREATININE 2.96*  --  1.94*  --   --   CALCIUM 7.3*  --  7.3*  --   --    < > = values in this interval not displayed.    COAG Lab Results  Component Value Date   INR 1.3 (H) 10/07/2020   INR 1.4 (H) 10/06/2020   INR 1.4 (H) 10/05/2020   No results found for: PTT  I have seen and evaluated the patient. I agree with the PA note as documented above. Postop day 5 status post right axillary to femoral bypass and left axillary to SFA bypass and excision of infected aortobifem.  She remains critically ill in the ICU in multi-organ failure.  She was reintubated yesterday.  Now on Levophed and vasopressin.   Neuro: Intubated.  On propofol.  GCS 3 T. CV: Since reintubation now on 15 of Levophed and 0.03 vasopressin.  Hemoglobin stable 9.2.  Gas suggest respiratory acidosis.  Started on amio overnight for afib RVR.  Will have to watch bradycardia. Pulm: Intubated.  Appreciate critical care support GI: Keep NPO.  NG tube bilious output.  Has drains for duodenal  injury that is putting out serosanguineous.  TPN started over weekend.   Renal: Minimal urine output overnight.  Tolerating CRRT at bedside.  Removing about 150 mL an hour.  Appreciate nephrology input.  No concerning output from drain around ureter repair. ID: Appreciate ID.  Rising leukocytosis -White count up to 36 today.  Given reintubation chest x-ray suggest ARDS.  I suspect her lungs are the driving factor given no concerning output from her duodenal or ureter drain.  Continue daptomycin, cefepime, flagyl.  Appreciate ID following. Extremities: Ax fem grafts bilaterally appear to be patent with palpable DP pulses.   I updated daughter at bedside.  Discussed that she remains critically ill in multisystem organ failure.  Discussed my concern for rising white count.  We will have to see how she does in the coming days.    Marty Heck, MD [facility] Office: [phone]

## 2020-10-07 NOTE — Progress Notes (Signed)
Patient remains critically ill in the ICU in multisystem organ failure.  She had an event earlier today where she became profoundly hypotensive and bradycardic after she was turned and required epi in addition to her pressor requirements.  Hgb 9.2 --> 8.7 today and relatively stable.  WBC increased to 42.  Remains on vent after intubation yesterday.  CRRT today with no UOP.  Pressor requirements slightly increased now leveophed at 22 and vaso at 0.03.  I discussed with her 2 daughters at bedside her overall condition and they met with palliative care earlier today.  They are leaning toward withdrawal of care tomorrow and do not want to see their mom suffer.  They want to give her tonight to see if she has any clinical improvement.  I discussed her overall clinical condition.    Marty Heck, MD [facility] [place] Office: [place]

## 2020-10-07 NOTE — Progress Notes (Signed)
Patient became hypotensive while turned to side to change pad. SBP 140s >> 100s >> 50s. HR sinus brady in the 13s.  Femoral pulse present with doppler. Norepi increased from 12 to 20 to 30 to 40 and  vasopressin increased from 0.03 to 0.04 in attempt to increase BP rapidly. Code cart brought to bedside. CRRT fluid removal rate decreased to keep even & propofol paused during event. CCM NP on unit and came to bedside immediately. 1 amp of epi given at 1109 and 1 amp of bicarb given at 1111 per CCM NP orders. ISTAT ABG, CBC & BMP obtained, see results. Patient  sinus to sinus tach. BP rebounded and became hypertensive with SBP 200s. Norepi titrated back down per CCM NP verbal orders. BP remains labile with positioning. Patient stabilized at this time and RN will continue to monitor patient.   Luberta Mutter RN

## 2020-10-07 NOTE — Progress Notes (Addendum)
[name]ious [facility]    Date of Admission:  10/07/2020     ID: [name] is a 75 y.o. 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 Has warming blanket/bear hugger for hypothermia. Daughter at bedside - and discussed recent change of plans  Labs show worsening leukocytosis of WBC 36K, OR cx now growing rare c.albicans  Medications:   sodium chloride   Intravenous Once   arformoterol  15 mcg Nebulization BID   budesonide (PULMICORT) nebulizer solution  0.25 mg Nebulization BID   chlorhexidine  15 mL Mouth Rinse BID   chlorhexidine gluconate (MEDLINE KIT)  15 mL Mouth Rinse BID   Chlorhexidine Gluconate Cloth  6 each Topical Daily   etomidate       fentaNYL       heparin injection (subcutaneous)  5,000 Units Subcutaneous Q8H   insulin aspart  0-9 Units Subcutaneous Q4H   ketamine HCl       levothyroxine  50 mcg Intravenous Daily   mouth rinse  15 mL Mouth Rinse q12n4p   mouth rinse  15 mL Mouth Rinse 10 times per day   midazolam       pantoprazole (PROTONIX) IV  40 mg Intravenous QHS   phenylephrine       rocuronium bromide       sodium chloride flush  10-40 mL Intracatheter Q12H    Objective: Vital signs in last 24 hours: Temp:   [95 F (35 C)-98.2 F (36.8 C)] 95.2 F (35.1 C) (08/17 0845) Pulse Rate:  [48-149] 53 (08/17 0845) Resp:  [12-31] 14 (08/17 0845) BP: (76-184)/(38-140) 138/55 (08/17 0845) SpO2:  [65 %-100 %] 96 % (08/17 0845) Arterial Line BP: (70-168)/(37-67) 143/49 (08/17 0845) FiO2 (%):  [60 %-100 %] 60 % (08/17 0805) Weight:  [58.1 kg] 58.1 kg (08/17 0500)  Physical Exam  Constitutional:  sedated, intubated. appears well-developed and well-nourished. No distress.  HENT: [place]/AT,  Mouth/Throat: OETT in palce Cardiovascular: Normal rate, regular rhythm and normal heart sounds. Exam reveals no gallop and no friction rub.  No murmur heard.  Pulmonary/Chest: Effort normal and breath sounds normal. No respiratory distress.  has no wheezes.  Neck = supple, no nuchal rigidity; bilateral lines Abdominal: Soft. Bowel sounds are normal.  exhibits no distension. There is no tenderness. Serosanginous fluid in jp drains Staples are c/d/I to abdomen and chest wall Skin: Skin is warm and dry. No rash noted. No erythema.      Lab Results Recent Labs    10/06/20 1556 10/06/20 2128 10/07/20 0309 10/07/20 0324 10/07/20 0641  WBC 28.3*  --  36.6*  --   --   HGB 9.3*   < > 9.3* 10.2* 9.2*  HCT 28.8*   < > 29.8* 30.0* 27.0*  NA 139   < > 136 138 136  K 4.4   < > 4.2 4.1 4.4  CL 105  --  104  --   --   CO2 23  --  26  --   --   BUN 38*  --  26*  --   --   CREATININE 2.96*  --  1.94*  --   --    < > = values in this interval not displayed.   Liver Panel Recent Labs    10/06/20 0759 10/06/20 1556 10/07/20 0309  PROT 3.9*  --  4.3*  ALBUMIN 1.5* 1.6* 1.5*  1.6*  AST 358*  --  242*  ALT 715*  --  572*  ALKPHOS 171*  --  209*  BILITOT 1.5*  --  1.1  BILIDIR 0.7*  --  0.6*  IBILI 0.8  --  0.5    Microbiology: Reviewed + c.albicans from aortic graft. cx Studies/Results: DG CHEST PORT 1 VIEW  Result Date: 10/07/2020 CLINICAL DATA:  Endotracheal tube placement. EXAM: PORTABLE CHEST 1 VIEW  COMPARISON:  10/05/2020. FINDINGS: Endotracheal tube terminates approximately 2.1 cm above the carina. Nasogastric tube is followed into the stomach with the tip projecting beyond the inferior margin of the image. Bilateral internal jugular central lines terminate in the SVC. Heart size normal. Thoracic aorta is calcified. Mixed interstitial and airspace opacification bilaterally, worst in the right perihilar region and increased from 10/05/2020. Moderate left pleural effusion, increased. Surgical skin staples in both high axillary regions. IMPRESSION: 1. Increasing mixed interstitial and airspace opacification may be due to edema or pneumonia. 2. Moderate left pleural effusion, increased. Electronically Signed   By: Lorin Picket M.D.   On: 10/07/2020 08:46   DG Chest Port 1 View  Result Date: 10/05/2020 CLINICAL DATA:  Post central line placement EXAM: PORTABLE CHEST 1 VIEW COMPARISON:  10/04/2020 FINDINGS: New left IJ double-lumen line tip overlies central SVC. Right IJ central line overlies SVC. Enteric tube passes below the diaphragm with tip out of field of view. Endotracheal tube is no longer present. Minor bibasilar atelectasis. No pneumothorax. Stable mild interstitial prominence. Stable cardiomediastinal contours. IMPRESSION: New left IJ central line tip overlies SVC.  No pneumothorax. Mild bibasilar atelectasis. Electronically Signed   By: Macy Mis M.D.   On: 10/05/2020 15:26   ECHOCARDIOGRAM COMPLETE  Result Date: 10/05/2020    ECHOCARDIOGRAM REPORT   Patient Name:   [name] Date of Exam: 10/05/2020 Medical Rec #:  [id number]      Height:       59.0 in Accession #:    [id number]     Weight:       110.7 lb Date of Birth:  October 16, 1945     BSA:          1.434 m Patient Age:    64 years       BP:           131/62 mmHg Patient Gender: F              HR:           71 bpm. Exam Location:  Inpatient Procedure: 2D Echo, Color Doppler and Cardiac Doppler Indications:    I42.80 Non-ischemic  cardiomyopathy  History:        Patient has no prior history of Echocardiogram examinations.                 COPD; Risk Factors:Dyslipidemia and Hypertension.  Sonographer:    Raquel Sarna Senior RDCS Referring Phys: [id number] Darnelle Maffucci  SMITH IMPRESSIONS  1. Left ventricular ejection fraction, by estimation, is 60 to 65%. The left ventricle has normal function. The left ventricle has no regional wall motion abnormalities. Left ventricular diastolic parameters are consistent with Grade II diastolic dysfunction (pseudonormalization).  2. Right ventricular systolic function is normal. The right ventricular size is normal. Tricuspid regurgitation signal is inadequate for assessing PA pressure.  3. Left atrial size was mildly dilated.  4. The mitral valve is normal in structure. No evidence of mitral valve regurgitation. No evidence of mitral stenosis. The mean mitral valve gradient is 4.0 mmHg with average heart rate of 76 bpm.  5. The aortic valve was not well visualized. Aortic valve regurgitation is mild. Mild to moderate aortic valve sclerosis/calcification is present, without any evidence of aortic stenosis.  6. The inferior vena cava is normal in size with greater than 50% respiratory variability, suggesting right atrial pressure of 3 mmHg.  7. Increased pulse wave Spectral Doppler near the takeoff of the left common carotid. Consider dedicated study for assessment of carotid artery stenosis if clinically indicated. Comparison(s): No prior Echocardiogram. FINDINGS  Left Ventricle: Left ventricular ejection fraction, by estimation, is 60 to 65%. The left ventricle has normal function. The left ventricle has no regional wall motion abnormalities. The left ventricular internal cavity size was small. There is no left ventricular hypertrophy of the basal-septal segment. Left ventricular diastolic parameters are consistent with Grade II diastolic dysfunction (pseudonormalization). Right Ventricle: The right ventricular size is  normal. No increase in right ventricular wall thickness. Right ventricular systolic function is normal. Tricuspid regurgitation signal is inadequate for assessing PA pressure. Left Atrium: Left atrial size was mildly dilated. Right Atrium: Right atrial size was normal in size. Pericardium: There is no evidence of pericardial effusion. Mitral Valve: The mitral valve is normal in structure. No evidence of mitral valve regurgitation. No evidence of mitral valve stenosis. The mean mitral valve gradient is 4.0 mmHg with average heart rate of 76 bpm. Tricuspid Valve: The tricuspid valve is normal in structure. Tricuspid valve regurgitation is trivial. No evidence of tricuspid stenosis. Aortic Valve: The aortic valve was not well visualized. Aortic valve regurgitation is mild. Mild to moderate aortic valve sclerosis/calcification is present, without any evidence of aortic stenosis. Aortic valve mean gradient measures 8.0 mmHg. Aortic valve peak gradient measures 19.7 mmHg. Aortic valve area, by VTI measures 2.26 cm. Pulmonic Valve: The pulmonic valve was grossly normal. Pulmonic valve regurgitation is not visualized. No evidence of pulmonic stenosis. Aorta: The aortic root is normal in size and structure. Venous: The inferior vena cava is normal in size with greater than 50% respiratory variability, suggesting right atrial pressure of 3 mmHg. IAS/Shunts: The atrial septum is grossly normal.  LEFT VENTRICLE PLAX 2D LVIDd:         3.70 cm  Diastology LVIDs:         2.40 cm  LV e' medial:    6.74 cm/s LV PW:         0.80 cm  LV E/e' medial:  18.2 LV IVS:        1.10 cm  LV e' lateral:   8.49 cm/s LVOT diam:     1.90 cm  LV E/e' lateral: 14.5 LV SV:         95 LV SV Index:   66 LVOT Area:     2.84 cm  RIGHT VENTRICLE RV S prime:     15.90 cm/s TAPSE (M-mode): 2.2 cm LEFT ATRIUM  Index       RIGHT ATRIUM           Index LA diam:        2.70 cm 1.88 cm/m  RA Area:     12.80 cm LA Vol (A2C):   58.7 ml 40.93 ml/m  RA Volume:   30.80 ml  21.47 ml/m LA Vol (A4C):   42.8 ml 29.84 ml/m LA Biplane Vol: 50.3 ml 35.07 ml/m  AORTIC VALVE AV Area (Vmax):    2.21 cm AV Area (Vmean):   2.39 cm AV Area (VTI):     2.26 cm AV Vmax:           222.00 cm/s AV Vmean:          127.000 cm/s AV VTI:            0.421 m AV Peak Grad:      19.7 mmHg AV Mean Grad:      8.0 mmHg LVOT Vmax:         173.00 cm/s LVOT Vmean:        107.000 cm/s LVOT VTI:          0.335 m LVOT/AV VTI ratio: 0.80  AORTA Ao Root diam: 3.10 cm MITRAL VALVE MV Area (PHT): 3.27 cm     SHUNTS MV Mean grad:  4.0 mmHg     Systemic VTI:  0.34 m MV Decel Time: 232 msec     Systemic Diam: 1.90 cm MV E velocity: 123.00 cm/s MV A velocity: 145.00 cm/s MV E/A ratio:  0.85 Rudean Haskell MD Electronically signed by Rudean Haskell MD Signature Date/Time: 10/05/2020/4:08:43 PM    Final    US Abdomen Limited RUQ (LIVER/GB)  Result Date: 10/05/2020 CLINICAL DATA:  Transaminitis EXAM: ULTRASOUND ABDOMEN LIMITED RIGHT UPPER QUADRANT COMPARISON:  CT abdomen and pelvis dated October 30, 2012 FINDINGS: Gallbladder: Gallbladder is surgically absent. Common bile duct: Diameter: 7 mm Liver: No focal lesion identified. Heterogeneous echotexture. Portal vein is patent on color Doppler imaging with normal direction of blood flow towards the liver. Other: Right pleural effusion. IMPRESSION: Mildly dilated common bile duct which can be seen status post cholecystectomy. Small right pleural effusion. Electronically Signed   By: Yetta Glassman M.D.   On: 10/05/2020 11:04     Assessment/Plan: Polymicrobial vascular graft infection -- new cx data shows c.albicans isolated from vascular graft cx. Will add anidulafungin in addn to current abtx regimen of cefepime, metronidazole and daptomycin. Renally dosed for CRRT  Small duodenal tear = repaired and continued on cefepime/metronidazole  Aki on ckd = continue on CRRT  Hypotension /shock = continue to titrate vasopressors for  MAP> 60.  Respiratory distress s/p  = required to be re-intubated early morning due to hypoxemia; continue with PCCM management  Transaminitis = continues to trend down. We will watch for any elevation given addition of anti-fungal therapy  Overall prognosis guarded, given acute worsening overnight and this morning.  [facility]ious Diseases Cell: [phone] Pager: [phone]  10/07/2020, 9:25 AM

## 2020-10-07 NOTE — Op Note (Signed)
Intubation Procedure Note  NABIL MOULIN  UL:[id number]  January 04, 1946  Date:10/07/20  Time:5:46 AM   Provider Performing:Shaliyah Taite R Imojean Yoshino    Procedure: Intubation ([id number])  Indication(s) Respiratory Failure  Consent Unable to obtain consent due to emergent nature of procedure.   Anesthesia Ketamine '30mg'$    Time Out Verified patient identification, verified procedure, site/side was marked, verified correct patient position, special equipment/implants available, medications/allergies/relevant history reviewed, required imaging and test results available.   Sterile Technique Usual hand hygeine, masks, and gloves were used   Procedure Description Patient positioned in bed supine.  Sedation given as noted above.  Patient was intubated with 3 miller blade endotracheal tube using  7.45m .  View was Grade 1 full glottis .  Number of attempts was 1.  Colorimetric CO2 detector was consistent with tracheal placement.   Complications/Tolerance None; patient tolerated the procedure well. Chest X-ray is ordered to verify placement.   EBL 0   Specimen(s) None

## 2020-10-07 NOTE — Progress Notes (Signed)
[facility] Progress Note Patient Name: MANNING AVELLA DOB: [birth year] MRN: [id number]   Date of Service  10/07/2020  HPI/Events of Note  Post-intubation ABG reviewed.  eICU Interventions  FIO2 reduced to 70 %, RR increased to 24, ABG ordered for 8 AM.        Wende Crease U Sariya Trickey 10/07/2020, 6:59 AM

## 2020-10-07 NOTE — Progress Notes (Signed)
Pt rectal temp at 2300 was 95.5. Pt placed on barehugger after temp obtained. Rechecked and it is now 95.1. Pt is covered in warm blankets, CRRT blood warmer is on and functioning. Increased barehugger temp to 43c. Will let Elink aware.

## 2020-10-07 NOTE — Consult Note (Signed)
Consultation Note Date: 10/07/2020   Patient Name: [name]  DOB: 1945/11/22  MRN: [id number]  Age / Sex: 75 y.o., female  PCP: Jolinda Croak, MD Referring Physician: Elam Dutch, MD  Reason for Consultation: Establishing goals of care  HPI/Patient Profile: 75 y.o. female  with past medical history of carotid artery occlusion, CVA, hyper otitis, HTN, HLD, hypothyroidism, IBS, hydronephrosis, COPD, MI, PVD, PAF, aortobifemoral bypass graft 10 years ago admitted on 09/23/2020 with left femoral stent bleeding and infection. On 8/9 Status post repair of left femoral anastomosis with bovine pericardial patch for bleeding surgery performed. On 8/12, Vascular surgery  performed: REMOVAL OF AORTA BI-FEMORAL BYPASS GRAFT, placed RIGTH AXILLARY BI-FEMORAL BYPASS GRAFT and LEFT AXILLARY POPLITEAL BYPASS GRAFT. There was small serosal injury to duodenum s/p procedure. General Surgery consulted. Patched with omentum and left NG tube with drain. Urology placed Right ureteroureterostomy with ureteral stent placement/exchange for complete mid ureteral transection. Patient remained intubated following surgery; but extubated 8/14. 8/17 early AM required reintubation. Remains on pressors and CVVHD. PMT consulted to discuss [place].   Clinical Assessment and Goals of Care: I have reviewed medical records including EPIC notes, labs and imaging, received report from RN, assessed the patient and then met with patient's daughters, Wells Guiles and Jackelyn Poling,  to discuss diagnosis prognosis, GOC, EOL wishes, disposition and options.  I introduced Palliative Medicine as specialized medical care for people living with serious illness. It focuses on providing relief from the symptoms and stress of a serious illness. The goal is to improve quality of life for both the patient and the family.  We discussed a brief life review of the patient. Daughters share about patient's family  - married to her spouse for 77 years. They tell very sweet stories about their parents relationship and they way they met. Patient has 6 children. Very close family. 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   As far as functional and nutritional status, patient was doing very well prior to current illness. She did all of the house work and laundry regularly. No functional decline. Great appetite. Patient's current condition is incredibly shocking for family.    We discussed patient's current illness and what it means in the larger context of patient's on-going co-morbidities.  Natural disease trajectory and expectations at EOL were discussed. We discussed her multiorgan failure. We discussed that it is very unlikely patient will survive this situation. We also discuss concern that she will not be able to return to prior level of function - family feels that patient would not be able to accept this result.   I attempted to elicit values and goals of care important to the patient.  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   The difference between aggressive medical intervention and comfort care was considered in light of the patient's goals of care. We discussed a plan to continue current interventions today/overnight and reevaluate tomorrow.   Advance directives, concepts specific to code status, artificial feeding and hydration, and rehospitalization were considered and discussed. Encouraged family to consider DNR status understanding evidenced based poor outcomes in similar hospitalized patients, as the cause of the arrest is likely associated with chronic/terminal disease rather than a reversible acute cardio-pulmonary event. Family agrees to DNR status - they  feel patient is too fragile for  these types of interventions.   Discussed with patient the importance of continued conversation with family and the medical providers regarding overall plan of care and treatment options, ensuring decisions are within the context of the patient's values and GOCs.    Family shares many times that they do not want patient to suffer and do not want suffering prolonged.  Questions and concerns were addressed. The family was encouraged to call with questions or concerns.   Primary Decision Maker HCPOA/daughter Verdis Prime   SUMMARY OF RECOMMENDATIONS   - code status change to DNR - continue current measures today/overnight and reevaluate tomorrow - with no improvement of further decline family would be open to comfort measures only - will follow  Code Status/Advance Care Planning: DNR     Primary Diagnoses: Present on Admission:  Infected prosthetic vascular graft ([place])  Endotracheally intubated  Hypomagnesemia  Hypothyroidism  Essential hypertension   I have reviewed the medical record, interviewed the patient and family, and examined the patient. The following aspects are pertinent.  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   Cerebrovascular occlusion 2001   Extracranial with Hx of TIA's   Depression    Hepatitis    Hyperlipidemia    IBS (irritable bowel syndrome)    Myocardial infarction ([place])    Peripheral vascular disease (H[facility])    had Carotid En   Rheumatic fever    Stroke (H[facility])    no residual- except left side of face has a small "twist"   Thyroid disease    Hypothyroidism   Social History   Socioeconomic History   Marital status: Married    Spouse name: Not on file   Number of children: Not on file   Years of education: Not on file   Highest education level: Not on file  Occupational History   Not on file  Tobacco Use   Smoking status: Every Day    Packs/day: 0.33    Years: 63.00    Pack years:  20.79    Types: Cigarettes   Smokeless tobacco: Never   Tobacco comments:    1 pack every 3 days x 70yr  Vaping Use   Vaping Use: Never used  Substance and Sexual Activity   Alcohol use: No   Drug use: No   Sexual activity: Not on file  Other Topics Concern   Not on file  Social History Narrative   Not on file   Social Determinants of Health   Financial Resource Strain: Not on file  Food Insecurity: Not on file  Transportation Needs: Not on file  Physical Activity: Not on file  Stress: Not on file  Social Connections: Not on file   Family History  Problem Relation Age of Onset   Cancer Sister    Hypertension Father    Heart attack Mother    Hyperlipidemia Mother    Kidney disease Brother    Cancer Brother    Cancer Brother        Lung    Colon cancer Neg Hx    Scheduled Meds:  sodium chloride   Intravenous Once   arformoterol  15 mcg Nebulization BID   budesonide (PULMICORT) nebulizer solution  0.25 mg Nebulization BID   chlorhexidine  15 mL Mouth Rinse BID   chlorhexidine gluconate (MEDLINE KIT)  15 mL Mouth Rinse BID   Chlorhexidine Gluconate Cloth  6 each  Topical Daily   etomidate       fentaNYL       heparin injection (subcutaneous)  5,000 Units Subcutaneous Q8H   insulin aspart  0-9 Units Subcutaneous Q4H   ketamine HCl       levothyroxine  50 mcg Intravenous Daily   mouth rinse  15 mL Mouth Rinse 10 times per day   midazolam       pantoprazole (PROTONIX) IV  40 mg Intravenous QHS   phenylephrine       rocuronium bromide       sodium chloride flush  10-40 mL Intracatheter Q12H   Continuous Infusions:   prismasol BGK 4/2.5 400 mL/hr at 10/07/20 1038    prismasol BGK 4/2.5 200 mL/hr at 10/07/20 1047   sodium chloride Stopped (10/07/20 1049)   amiodarone 30 mg/hr (10/07/20 1400)   [START ON 10-10-2020] anidulafungin     ceFEPime (MAXIPIME) IV Stopped (10/07/20 1010)   DAPTOmycin (CUBICIN)  IV 400 mg (10/06/20 2252)   magnesium sulfate bolus IVPB      metronidazole Stopped (10/07/20 0648)   norepinephrine (LEVOPHED) Adult infusion 20 mcg/min (10/07/20 1400)   phenylephrine (NEO-SYNEPHRINE) Adult infusion Stopped (10/07/20 0640)   prismasol BGK 4/2.5 1,500 mL/hr at 10/07/20 1205   propofol (DIPRIVAN) infusion 40 mcg/kg/min (10/07/20 1400)   TPN ADULT (ION) 55 mL/hr at 10/07/20 1400   TPN ADULT (ION)     vasopressin 0.03 Units/min (10/07/20 1444)   PRN Meds:.sodium chloride, acetaminophen **OR** acetaminophen, albuterol, bisacodyl, diphenhydrAMINE, heparin, heparin, hydrALAZINE, magnesium sulfate bolus IVPB, phenol, potassium chloride, sodium chloride flush Allergies  Allergen Reactions   Penicillins Swelling   Citalopram Other (See Comments)    Nightmares 10/16/2020 Pt states that she is currently taking this drug.   Morphine And Related Nausea And Vomiting   Losartan Nausea Only and Other (See Comments)    Nausea and dizzy. 10/03/2020 Pt states she does not recall having an intolerance to this drug.   Review of Systems  Unable to perform ROS: Intubated   Physical Exam Constitutional:      Comments: Sedated, remains on ventilator    Vital Signs: BP (!) 94/48   Pulse 64   Temp (!) 95.7 F (35.4 C)   Resp (!) 26   Ht 4' 11.02" (1.499 m)   Wt 58.1 kg   SpO2 99%   BMI 25.86 kg/m  Pain Scale: CPOT   Pain Score: Asleep   SpO2: SpO2: 99 % O2 Device:SpO2: 99 % O2 Flow Rate: .O2 Flow Rate (L/min): 30 L/min  IO: Intake/output summary:  Intake/Output Summary (Last 24 hours) at 10/07/2020 1625 Last data filed at 10/07/2020 1400 Gross per 24 hour  Intake 3034.26 ml  Output 4468 ml  Net -1433.74 ml    LBM: Last BM Date: 10/05/20 Baseline Weight: Weight: 46.7 kg Most recent weight: Weight: 58.1 kg     Palliative Assessment/Data: PPS 10%    Time Total: 90 minutes Greater than 50%  of this time was spent counseling and coordinating care related to the above assessment and plan.  Juel Burrow, DNP, AGNP-C Palliative  Medicine Team [phone] Pager: [phone]

## 2020-10-07 NOTE — Progress Notes (Signed)
PHARMACY - TOTAL PARENTERAL NUTRITION CONSULT NOTE  Indication:  Intolerance to enteral feeding  Patient Measurements: Height: 4' 11.02" (149.9 cm) Weight: 58.1 kg (128 lb 1.4 oz) IBW/kg (Calculated) : 43.24 TPN AdjBW (KG): 45.6 Body mass index is 25.86 kg/m.t:   Assessment:   Patient present 8/9 with L femoral stent bleeding/infection despite omadacycline PTA.  S/p repair of left femoral anastomosis with bovine pericardial patch for bleeding on 8/9. There was small serosal injury to duodenum s/p procedure 8/12 patched with omentum. High risk for ileus. No tube feeds or oral intake given duodenal injury.  No Cortak should be placed per vascular note 8/14 due to high risk of perforation in area of repair.  TPN consulted for TPN management.  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 CBG 130-160s with TPN rate increase to gal.  5 units SSI used s/p rate increase Electrolytes: K 4.4, Phos 2.2 s/p CRRT start Renal: Right ureteroureterostomy 8/12 with ureteral stent placement/exchange for complete mid ureteral transection CRRT started 8/16 - SCr 4.26>1.94, BUN 26 Hepatic: AST/ALT down to 242/572, alk phos up to 209, tbili down 1.1, TG 94 > 186 (ILE 20% of total kCal), albumin 1.5, BL prealbumin WNL 20.4 Intake / Output; MIVF: duodenal drain 240>148m, NG 2538m LBM 8/15, net +12L GI Imaging:  N/A GI Surgeries / Procedures:  - 8/9 repair of L femoral anastomosis with bovine pericardial patch for bleeding - 8/12 removal of aorta bi-femoral bypass graft, R bi-femoral and L axillary popliteal bypass graft  8/17 intubated  Central access: CVC TL placed 10/04/2020 TPN start date: 10/04/20  Nutritional Goals (per RD rec updated on 8/16): 1650-1850 kCal, 85-100 AA, >/= 1.6L fluid per  day  Current Nutrition:  TPN  Plan:  Adjust concentrated TPN to 60 ml/hr at 1800 with new RD goals TPN will provide 94g AA, 288g CHO and 36g ILE for a total of 1713kCal, meeting 100% of patient's needs Electrolytes in TPN: Na 5079mL, K 6mE43m, Ca 5mEq8m Mg 0mEq/19madd back Phos 5mmol/37mCl:Ac 1:2 Add standard MVI and trace elements to TPN.  No chromium while on RRT. Continue sensitive SSI Q4H Give NaPhos 20mmol 65m 1, f/u phos level this afternoon Renal function panel BID and daily Mag; daily hepatic fxn panel through 8/21 F/u ability to start trickle feeds per surgery  JonathanBertis Ruddy Clinical Pharmacist Please check AMION for all [facility] [place] 10/07/2020 8:17 AM

## 2020-10-07 NOTE — Progress Notes (Signed)
Patient ID: [name], female   DOB: 29-Sep-1945, 75 y.o.   MRN: [id number] [facility] Progress Note   Assessment/ Plan:   1.  Acute kidney injury: Timeline of events and available data points largely to ischemic ATN with major vascular surgery required as a lifesaving procedure.  Anuric/without renal recovery and will continue current prescription of CRRT for correction of metabolic abnormalities/azotemia and volume unloading.  Overall prognosis appears poor at this point. 2.  Removal of aortobifemoral bypass graft with revascularization surgery of lower extremities complicated by serosal duodenal injury and right ureteral transection.  Hemodynamically stable from vascular surgery standpoint and plans noted for management of ureteral stent.  On daptomycin, cefepime and metronidazole with prior history of MRSE infection. 3.  Hypoxic respiratory failure: Reintubated this morning after inability to improve oxygenation with nasal cannula/NRB.  Management per CCM. 4.  Anemia: Secondary to acute blood loss/vascular surgery.  Continue to monitor for need to replace with PRBCs.  Subjective:   Intubated earlier this morning for hypoxic respiratory failure.  Tolerating CRRT with transient hemodynamic instability overnight after given Dilaudid.   Objective:   BP (!) 135/53   Pulse (!) 48   Temp (!) 95.4 F (35.2 C)   Resp 19   Ht 4' 11.02" (1.499 m)   Wt 58.1 kg   SpO2 100%   BMI 25.86 kg/m   Intake/Output Summary (Last 24 hours) at 10/07/2020 0704 Last data filed at 10/07/2020 0700 Gross per 24 hour  Intake 2378.64 ml  Output 4939 ml  Net -2560.36 ml   Weight change:   Physical Exam: Gen: Intubated, sedated CVS: Pulse regular rhythm, normal rate, normal S1 and S2.  Temporary left IJ HD catheter Resp: Coarse/transmitted breath sounds bilaterally, no distinct rales or rhonchi Abd: Soft, flat, surgical incisions with intact staples Ext: 1+ lower extremity edema with 2+  upper extremity edema  Imaging: DG Chest Port 1 View  Result Date: 10/05/2020 CLINICAL DATA:  Post central line placement EXAM: PORTABLE CHEST 1 VIEW COMPARISON:  10/04/2020 FINDINGS: New left IJ double-lumen line tip overlies central SVC. Right IJ central line overlies SVC. Enteric tube passes below the diaphragm with tip out of field of view. Endotracheal tube is no longer present. Minor bibasilar atelectasis. No pneumothorax. Stable mild interstitial prominence. Stable cardiomediastinal contours. IMPRESSION: New left IJ central line tip overlies SVC.  No pneumothorax. Mild bibasilar atelectasis. Electronically Signed   By: Macy Mis M.D.   On: 10/05/2020 15:26   ECHOCARDIOGRAM COMPLETE  Result Date: 10/05/2020    ECHOCARDIOGRAM REPORT   Patient Name:   [name] Date of Exam: 10/05/2020 Medical Rec #:  [id number]      Height:       59.0 in Accession #:    [id number]     Weight:       110.7 lb Date of Birth:  1946/01/12     BSA:          1.434 m Patient Age:    73 years       BP:           131/62 mmHg Patient Gender: F              HR:           71 bpm. Exam Location:  Inpatient Procedure: 2D Echo, Color Doppler and Cardiac Doppler Indications:    I42.80 Non-ischemic cardiomyopathy  History:        Patient has no prior history of  Echocardiogram examinations.                 COPD; Risk Factors:Dyslipidemia and Hypertension.  Sonographer:    Raquel Sarna Senior RDCS Referring Phys: [id number] [place]  1. Left ventricular ejection fraction, by estimation, is 60 to 65%. The left ventricle has normal function. The left ventricle has no regional wall motion abnormalities. Left ventricular diastolic parameters are consistent with Grade II diastolic dysfunction (pseudonormalization).  2. Right ventricular systolic function is normal. The right ventricular size is normal. Tricuspid regurgitation signal is inadequate for assessing PA pressure.  3. Left atrial size was mildly dilated.  4. The  mitral valve is normal in structure. No evidence of mitral valve regurgitation. No evidence of mitral stenosis. The mean mitral valve gradient is 4.0 mmHg with average heart rate of 76 bpm.  5. The aortic valve was not well visualized. Aortic valve regurgitation is mild. Mild to moderate aortic valve sclerosis/calcification is present, without any evidence of aortic stenosis.  6. The inferior vena cava is normal in size with greater than 50% respiratory variability, suggesting right atrial pressure of 3 mmHg.  7. Increased pulse wave Spectral Doppler near the takeoff of the left common carotid. Consider dedicated study for assessment of carotid artery stenosis if clinically indicated. Comparison(s): No prior Echocardiogram. FINDINGS  Left Ventricle: Left ventricular ejection fraction, by estimation, is 60 to 65%. The left ventricle has normal function. The left ventricle has no regional wall motion abnormalities. The left ventricular internal cavity size was small. There is no left ventricular hypertrophy of the basal-septal segment. Left ventricular diastolic parameters are consistent with Grade II diastolic dysfunction (pseudonormalization). Right Ventricle: The right ventricular size is normal. No increase in right ventricular wall thickness. Right ventricular systolic function is normal. Tricuspid regurgitation signal is inadequate for assessing PA pressure. Left Atrium: Left atrial size was mildly dilated. Right Atrium: Right atrial size was normal in size. Pericardium: There is no evidence of pericardial effusion. Mitral Valve: The mitral valve is normal in structure. No evidence of mitral valve regurgitation. No evidence of mitral valve stenosis. The mean mitral valve gradient is 4.0 mmHg with average heart rate of 76 bpm. Tricuspid Valve: The tricuspid valve is normal in structure. Tricuspid valve regurgitation is trivial. No evidence of tricuspid stenosis. Aortic Valve: The aortic valve was not well  visualized. Aortic valve regurgitation is mild. Mild to moderate aortic valve sclerosis/calcification is present, without any evidence of aortic stenosis. Aortic valve mean gradient measures 8.0 mmHg. Aortic valve peak gradient measures 19.7 mmHg. Aortic valve area, by VTI measures 2.26 cm. Pulmonic Valve: The pulmonic valve was grossly normal. Pulmonic valve regurgitation is not visualized. No evidence of pulmonic stenosis. Aorta: The aortic root is normal in size and structure. Venous: The inferior vena cava is normal in size with greater than 50% respiratory variability, suggesting right atrial pressure of 3 mmHg. IAS/Shunts: The atrial septum is grossly normal.  LEFT VENTRICLE PLAX 2D LVIDd:         3.70 cm  Diastology LVIDs:         2.40 cm  LV e' medial:    6.74 cm/s LV PW:         0.80 cm  LV E/e' medial:  18.2 LV IVS:        1.10 cm  LV e' lateral:   8.49 cm/s LVOT diam:     1.90 cm  LV E/e' lateral: 14.5 LV SV:  95 LV SV Index:   66 LVOT Area:     2.84 cm  RIGHT VENTRICLE RV S prime:     15.90 cm/s TAPSE (M-mode): 2.2 cm LEFT ATRIUM             Index       RIGHT ATRIUM           Index LA diam:        2.70 cm 1.88 cm/m  RA Area:     12.80 cm LA Vol (A2C):   58.7 ml 40.93 ml/m RA Volume:   30.80 ml  21.47 ml/m LA Vol (A4C):   42.8 ml 29.84 ml/m LA Biplane Vol: 50.3 ml 35.07 ml/m  AORTIC VALVE AV Area (Vmax):    2.21 cm AV Area (Vmean):   2.39 cm AV Area (VTI):     2.26 cm AV Vmax:           222.00 cm/s AV Vmean:          127.000 cm/s AV VTI:            0.421 m AV Peak Grad:      19.7 mmHg AV Mean Grad:      8.0 mmHg LVOT Vmax:         173.00 cm/s LVOT Vmean:        107.000 cm/s LVOT VTI:          0.335 m LVOT/AV VTI ratio: 0.80  AORTA Ao Root diam: 3.10 cm MITRAL VALVE MV Area (PHT): 3.27 cm     SHUNTS MV Mean grad:  4.0 mmHg     Systemic VTI:  0.34 m MV Decel Time: 232 msec     Systemic Diam: 1.90 cm MV E velocity: 123.00 cm/s MV A velocity: 145.00 cm/s MV E/A ratio:  0.85 Rudean Haskell MD Electronically signed by Rudean Haskell MD Signature Date/Time: 10/05/2020/4:08:43 PM    Final    US Abdomen Limited RUQ (LIVER/GB)  Result Date: 10/05/2020 CLINICAL DATA:  Transaminitis EXAM: ULTRASOUND ABDOMEN LIMITED RIGHT UPPER QUADRANT COMPARISON:  CT abdomen and pelvis dated October 30, 2012 FINDINGS: Gallbladder: Gallbladder is surgically absent. Common bile duct: Diameter: 7 mm Liver: No focal lesion identified. Heterogeneous echotexture. Portal vein is patent on color Doppler imaging with normal direction of blood flow towards the liver. Other: Right pleural effusion. IMPRESSION: Mildly dilated common bile duct which can be seen status post cholecystectomy. Small right pleural effusion. Electronically Signed   By: Yetta Glassman M.D.   On: 10/05/2020 11:04    Labs: BMET Recent Labs  Lab 10/03/20 1047 10/04/20 0340 10/05/20 0348 10/05/20 0757 10/05/20 1629 10/06/20 0451 10/06/20 1556 10/06/20 2128 10/06/20 2307 10/07/20 0309 10/07/20 0324 10/07/20 0641  NA 139 140 138   < > 139 140 139 139 138 136 138 136  K 4.5 3.6 4.9   < > 4.3 4.5 4.4 4.4 4.5 4.2 4.1 4.4  CL 109 102 103  --  103 105 105  --   --  104  --   --   CO2 21* 26 26  --  24 24 23   --   --  26  --   --   GLUCOSE 162* 115* 120*  --  131* 136* 131*  --   --  169*  --   --   BUN 20 24* 39*  --  45* 51* 38*  --   --  26*  --   --   CREATININE 1.88*  2.54* 3.57*  --  3.81* 4.26* 2.96*  --   --  1.94*  --   --   CALCIUM 6.8* 7.1* 7.0*  --  7.0* 7.2* 7.3*  --   --  7.3*  --   --   PHOS 4.7*  --  6.5*  --  6.0* 5.2* 3.2  --   --  2.2*  --   --    < > = values in this interval not displayed.   CBC Recent Labs  Lab 10/05/20 1629 10/06/20 0451 10/06/20 1556 10/06/20 2128 10/06/20 2307 10/07/20 0309 10/07/20 0324 10/07/20 0641  WBC 24.7* 28.0* 28.3*  --   --  36.6*  --   --   HGB 8.9* 8.8* 9.3*   < > 9.5* 9.3* 10.2* 9.2*  HCT 28.4* 26.7* 28.8*   < > 28.0* 29.8* 30.0* 27.0*  MCV 92.5 91.8  90.6  --   --  93.4  --   --   PLT 113* 120* 113*  --   --  116*  --   --    < > = values in this interval not displayed.    Medications:     sodium chloride   Intravenous Once   arformoterol  15 mcg Nebulization BID   budesonide (PULMICORT) nebulizer solution  0.25 mg Nebulization BID   chlorhexidine  15 mL Mouth Rinse BID   chlorhexidine gluconate (MEDLINE KIT)  15 mL Mouth Rinse BID   Chlorhexidine Gluconate Cloth  6 each Topical Daily   etomidate       fentaNYL       heparin injection (subcutaneous)  5,000 Units Subcutaneous Q8H   insulin aspart  0-9 Units Subcutaneous Q4H   ketamine HCl       levothyroxine  50 mcg Intravenous Daily   mouth rinse  15 mL Mouth Rinse q12n4p   mouth rinse  15 mL Mouth Rinse 10 times per day   midazolam       pantoprazole (PROTONIX) IV  40 mg Intravenous QHS   phenylephrine       rocuronium bromide       sodium chloride flush  10-40 mL Intracatheter Q12H   Elmarie Shiley, MD 10/07/2020, 7:04 AM

## 2020-10-07 NOTE — Progress Notes (Signed)
ABG results called to [name].

## 2020-10-07 NOTE — Progress Notes (Signed)
eLink Physician-Brief Progress Note Patient Name: [name] DOB: 1945/09/24 MRN: [id number]   Date of Service  10/07/2020  HPI/Events of Note  Patient with recurrence of desaturation, she is now on a non re-breather mask combined with high flow oxygen, ABG will be ordered. However patient probably needs to be intubated sooner rather than later, as she is a full code with an extremely tenuous respiratory status.  eICU Interventions  PCCM Ground Crew requested to evaluate patient for possible intubation. ABG ordered.        Kerry Kass Skippy [place] 10/07/2020, 4:50 AM

## 2020-10-08 DIAGNOSIS — J9601 Acute respiratory failure with hypoxia: Secondary | ICD-10-CM | POA: Diagnosis not present

## 2020-10-08 LAB — RENAL FUNCTION PANEL
Albumin: 1.5 g/dL — ABNORMAL LOW (ref 3.5–5.0)
Anion gap: 12 (ref 5–15)
BUN: 16 mg/dL (ref 8–23)
CO2: 22 mmol/L (ref 22–32)
Calcium: 7.6 mg/dL — ABNORMAL LOW (ref 8.9–10.3)
Chloride: 100 mmol/L (ref 98–111)
Creatinine, Ser: 1.28 mg/dL — ABNORMAL HIGH (ref 0.44–1.00)
GFR, Estimated: 43 mL/min — ABNORMAL LOW (ref >60)
Glucose, Bld: 205 mg/dL — ABNORMAL HIGH (ref 70–99)
Phosphorus: 2.9 mg/dL (ref 2.5–4.6)
Potassium: 4.4 mmol/L (ref 3.5–5.1)
Sodium: 134 mmol/L — ABNORMAL LOW (ref 135–145)

## 2020-10-08 LAB — CBC
HCT: 29.6 % — ABNORMAL LOW (ref 36.0–46.0)
Hemoglobin: 9.1 g/dL — ABNORMAL LOW (ref 12.0–15.0)
MCH: 28.8 pg (ref 26.0–34.0)
MCHC: 30.7 g/dL (ref 30.0–36.0)
MCV: 93.7 fL (ref 80.0–100.0)
Platelets: 108 10*3/uL — ABNORMAL LOW (ref 150–400)
RBC: 3.16 MIL/uL — ABNORMAL LOW (ref 3.87–5.11)
RDW: 17.3 % — ABNORMAL HIGH (ref 11.5–15.5)
WBC: 44.9 10*3/uL — ABNORMAL HIGH (ref 4.0–10.5)
nRBC: 1.6 % — ABNORMAL HIGH (ref 0.0–0.2)

## 2020-10-08 LAB — PROTIME-INR
INR: 1.4 — ABNORMAL HIGH (ref 0.8–1.2)
Prothrombin Time: 16.9 seconds — ABNORMAL HIGH (ref 11.4–15.2)

## 2020-10-08 LAB — HEPATIC FUNCTION PANEL
ALT: 703 U/L — ABNORMAL HIGH (ref 0–44)
AST: 733 U/L — ABNORMAL HIGH (ref 15–41)
Albumin: 1.5 g/dL — ABNORMAL LOW (ref 3.5–5.0)
Alkaline Phosphatase: 256 U/L — ABNORMAL HIGH (ref 38–126)
Bilirubin, Direct: 1 mg/dL — ABNORMAL HIGH (ref 0.0–0.2)
Indirect Bilirubin: 0.8 mg/dL (ref 0.3–0.9)
Total Bilirubin: 1.8 mg/dL — ABNORMAL HIGH (ref 0.3–1.2)
Total Protein: 4.5 g/dL — ABNORMAL LOW (ref 6.5–8.1)

## 2020-10-08 LAB — CK: Total CK: 193 U/L (ref 38–234)

## 2020-10-08 LAB — GLUCOSE, CAPILLARY
Glucose-Capillary: 168 mg/dL — ABNORMAL HIGH (ref 70–99)
Glucose-Capillary: 174 mg/dL — ABNORMAL HIGH (ref 70–99)
Glucose-Capillary: 186 mg/dL — ABNORMAL HIGH (ref 70–99)

## 2020-10-08 LAB — AEROBIC/ANAEROBIC CULTURE W GRAM STAIN (SURGICAL/DEEP WOUND): Culture: NO GROWTH

## 2020-10-08 LAB — MAGNESIUM: Magnesium: 2.3 mg/dL (ref 1.7–2.4)

## 2020-10-08 MED ORDER — POLYVINYL ALCOHOL 1.4 % OP SOLN
1.0000 [drp] | Freq: Four times a day (QID) | OPHTHALMIC | Status: DC | PRN
  Filled 2020-10-08: qty 15

## 2020-10-08 MED ORDER — HALOPERIDOL LACTATE 5 MG/ML IJ SOLN: 2.5000 mg | INTRAMUSCULAR | Status: DC | PRN

## 2020-10-08 MED ORDER — FENTANYL 2500MCG IN NS 250ML (10MCG/ML) PREMIX INFUSION
0.0000 ug/h | INTRAVENOUS | Status: DC
  Administered 2020-10-08: 50 ug/h via INTRAVENOUS
  Filled 2020-10-08: qty 250

## 2020-10-08 MED ORDER — GLYCOPYRROLATE 0.2 MG/ML IJ SOLN: 0.2000 mg | INTRAMUSCULAR | Status: DC | PRN

## 2020-10-08 MED ORDER — DEXTROSE 5 % IV SOLN: INTRAVENOUS | Status: DC

## 2020-10-08 MED ORDER — ACETAMINOPHEN 325 MG PO TABS: 650.0000 mg | ORAL_TABLET | Freq: Four times a day (QID) | ORAL | Status: DC | PRN

## 2020-10-08 MED ORDER — MIDAZOLAM HCL 2 MG/2ML IJ SOLN: 2.0000 mg | INTRAMUSCULAR | Status: DC | PRN

## 2020-10-08 MED ORDER — ACETAMINOPHEN 650 MG RE SUPP: 650.0000 mg | Freq: Four times a day (QID) | RECTAL | Status: DC | PRN

## 2020-10-08 MED ORDER — DIPHENHYDRAMINE HCL 50 MG/ML IJ SOLN: 25.0000 mg | INTRAMUSCULAR | Status: DC | PRN

## 2020-10-08 MED ORDER — GLYCOPYRROLATE 1 MG PO TABS
1.0000 mg | ORAL_TABLET | ORAL | Status: DC | PRN
  Filled 2020-10-08: qty 1

## 2020-10-08 MED ORDER — FENTANYL CITRATE (PF) 100 MCG/2ML IJ SOLN: 50.0000 ug | INTRAMUSCULAR | Status: DC | PRN

## 2020-10-08 MED ORDER — FENTANYL BOLUS VIA INFUSION
100.0000 ug | INTRAVENOUS | Status: DC | PRN
  Administered 2020-10-08: 100 ug via INTRAVENOUS
  Filled 2020-10-08: qty 100

## 2020-10-09 ENCOUNTER — Ambulatory Visit: Payer: Medicare HMO | Admitting: Vascular Surgery

## 2020-10-18 LAB — GLUCOSE, CAPILLARY: Glucose-Capillary: <10 mg/dL — CL (ref 70–99)

## 2020-10-22 NOTE — Progress Notes (Signed)
Pt extubated to comfort care at this time.

## 2020-10-22 NOTE — Progress Notes (Signed)
NAME:  [name], MRN:  [id number], DOB:  Aug 15, 1945, LOS: 9 ADMISSION DATE:  10/01/2020, CONSULTATION DATE:  8/12 REFERRING MD:  Dr. 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  Patient has been on Omadacycline for 2 days prior to admission for infection and has recently noticed profuse bleeding from the site. Has had I&D of left groin in December 2021. Bleeding was stopped and EMS called to bring to [facility] on 8/9. Last dose of Eliquis for PAF on 8/8. Hgb and bp stable. Vascular surgery consulted. On 8/9 Status postrepair of left femoral anastomosis with bovine pericardial patch for bleeding surgery performed. 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 Started on Vanc/cefepime. Infectious disease consulted 8/10. On Vanc and ceftriaxone.  On 8/12, Vascular surgery  performed: REMOVAL OF AORTA BI-FEMORAL BYPASS GRAFT, placed RIGTH AXILLARY BI-FEMORAL BYPASS GRAFT and LEFT AXILLARY POPLITEAL BYPASS GRAFT. There was small serosal injury to duodenum s/p procedure. General Surgery consulted. Patched with omentum and left NG tube with drain. Urology placed Right ureteroureterostomy with ureteral stent placement/exchange for complete mid ureteral transection. Patient arrived to ICU intubated.  PCCM consulted for management of mechanical ventilation and medical management.  Pertinent  Medical History   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   Cerebrovascular occlusion 2001   Extracranial with Hx of TIA's   Depression    Hepatitis     Hyperlipidemia    IBS (irritable bowel syndrome)    Myocardial infarction [facility])    Peripheral vascular disease ([place])    had Carotid En   Rheumatic fever    Stroke ([place])    no residual- except left side of face has a small "twist"   Thyroid disease    Hypothyroidism     Significant Hospital Events: Including procedures, antibiotic start and stop dates in addition to other pertinent events   8/12: pccm consulted for management of mechanical ventilation s/p surgery placement of RIGHT axillay BI-FEMORAL BYPASS GRAFT and LEFT AXILLARY POPLITEAL BYPASS GRAFT  8/14 extubated 8/17 intubated   Interim History / Subjective:  Remains intubated on multiple pressors and CRRT.  Family at bedside.  Objective   Blood pressure 118/68, pulse (!) 102, temperature (!) 96.1 F (35.6 C), resp. rate (!) 26, height 4' 11.02" (1.499 m), weight 58.1 kg, SpO2 100 %. CVP:  [7 mmHg-14 mmHg] 14 mmHg  Vent Mode: PRVC FiO2 (%):  [50 %-60 %] 50 % Set Rate:  [24 bmp] 24 bmp Vt Set:  [400 mL] 400 mL PEEP:  [8 cmH20] 8 cmH20 Plateau Pressure:  [15 cmH20-21 cmH20] 20 cmH20   Intake/Output Summary (Last 24 hours) at 11/05/2020 0818 Last data filed at Nov 05, 2020 5974 Gross per 24 hour  Intake 3418.57 ml  Output 5588 ml  Net -2169.43 ml    Filed Weights   09/30/20 0600 10/01/20 0500 10/07/20 0500  Weight: 48.2 kg 50.2 kg 58.1 kg    Examination: Frail woman on vent occasional agonal gasps Heart sounds tachycardic Ext cool RASS -5 on propofol  Worsening shock liver WBC remains extremely  elevated  Resolved Hospital Problem list   Acute respiratory failure with hypoxia s/p surgical procedure requiring mechanical ventilation Concern for RLL HAP  Assessment & Plan:  Aortobifemoral bypass graft infection, unknown organism Removal of infected aorta bi-fem bypass graft Right axillary bi-fem bypass graft Axillary popliteal bypass graft Subsequent irreversible multiorgan failure, shock  Met with family  and discussed continued decline.  They stated Polly would never have wanted to suffer like this and they would like her to pass in peace.  Will transition to full comfort when family says their goodbyes today; continue vent/pressors in interim, DC CRRT.  Discussed with RN and RT.  35 minutes cc time Erskine Emery MD PCCM

## 2020-10-22 NOTE — Progress Notes (Signed)
PHARMACY - TOTAL PARENTERAL NUTRITION CONSULT NOTE  Indication:  Intolerance to enteral feeding  Patient Measurements: Height: 4' 11.02" (149.9 cm) Weight: 58.1 kg (128 lb 1.4 oz) IBW/kg (Calculated) : 43.24 TPN AdjBW (KG): 45.6 Body mass index is 25.86 kg/m.t:   Assessment:   Patient present 8/9 with L femoral stent bleeding/infection despite omadacycline PTA.  S/p repair of left femoral anastomosis with bovine pericardial patch for bleeding on 8/9. There was small serosal injury to duodenum s/p procedure 8/12 patched with omentum. High risk for ileus. No tube feeds or oral intake given duodenal injury.  No Cortak should be placed per vascular note 8/14 due to high risk of perforation in area of repair.  TPN consulted for TPN management.  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 CBG 170-200 with TPN rate increase to goal. SSI/12h: 7 units Electrolytes: K 4.4, Phos 2.9, Na 134, Mg 2.3 Renal: Right ureteroureterostomy 8/12 with ureteral stent placement/exchange for complete mid ureteral transection CRRT started 8/16 - to stop today -, BUN 16 Hepatic: AST/ALT up 733/703, alk phos up 256, TBili 1.8, TG 94 > 186 (ILE 20% of total kCal), albumin 1.5, BL prealbumin WNL 20.4 Intake / Output; MIVF: duodenal drain 240>120mL, NG 250mL, LBM 8/15, net +12L GI Imaging:  N/A GI Surgeries / Procedures:  - 8/9 repair of L femoral anastomosis with bovine pericardial patch for bleeding - 8/12 removal of aorta bi-femoral bypass graft, R bi-femoral and L axillary popliteal bypass graft  8/17 intubated  Central access: CVC TL placed 10/15/2020 TPN start date: 10/04/20  Nutritional Goals (per RD rec updated on 8/16): 1650-1850 kCal, 85-100 AA, >/= 1.6L fluid per day  Current Nutrition:  TPN  Plan:   - Plan is for transition to full comfort when family arrives today - Will hold off on making additional TPN today - Pharmacy will sign off of TPN consult - please re-consult us if needed  Thank you for allowing pharmacy to be a part of this patient's care.   , PharmD, BCPS Clinical Pharmacist Clinical phone for 10/12/2020: [phone] 10/04/2020 9:48 AM   **Pharmacist phone directory can now be found on [email or web address] ([email or web address]).  Listed under [facility].    

## 2020-10-22 NOTE — Progress Notes (Signed)
Palliative:  Yesterday, met with family and changed to DNR and discussed potential move to comfort care in 24 hours if no improvement or worsening.  This AM, patient extubated to comfort measures only following discussion between CCM and family. Checked in with nursing staff and family this morning. Patient appears comfortable. Nursing denies needs/concerns. Family reports all questions answered - no concerns.   Emotional support provided.  Juel Burrow, DNP, AGNP-C Palliative Medicine Team Team Phone # [phone]  Pager # [phone]  NO CHARGE

## 2020-10-22 NOTE — Progress Notes (Signed)
Patient ID: [name], female   DOB: 1945/04/05, 75 y.o.   MRN: [id number] With help from family/palliative care service, patient changed to DNR status and earlier today had terminal extubation with plans for comfort measures only.  CRRT discontinued.  Lori Shiley MD [facility]. Office # [phone] Pager # [phone] 12:04 PM

## 2020-10-22 NOTE — Progress Notes (Signed)
   10-19-2020 0954  Clinical Encounter Type  Visited With Patient and family together  Visit Type Initial;Psychological support;Spiritual support;Social support;Patient actively dying  Referral From [place] Emotional;Grief support  Stress Factors  Family Stress Factors Loss;Major life changes   CH visited pt. and family per referral from night chaplain for support for family.  When [place] arrived, pt. lying in bed on room air asleep, appearing undistressed, w/three daughters at bedside.  Pastor on speakerphone offered prayer of commendation for pt. and for strength for family as they grieve.  Pt.'s eldest son also present at 30 with his wife.  Family grieving appropriately; they shared that Pt has been sick for a while but "tried to be superwoman and play it tough."  An additional son is at home quarantining with COVID and was not able to be present.  Family grateful for support but share no immediate needs at this time.  Chaplains remain available via page for any further support needs.  Lindaann Pascal, Chaplain Pager: [phone]

## 2020-10-22 NOTE — Progress Notes (Signed)
OT Cancellation Note  Patient Details Name: [name] MRN: [id number] DOB: February 21, 1946   Cancelled Treatment:     Acknowledged discontinued OT order with pt transitioning to comfort care.  OT signing off.   Jolaine Artist, OT Acute Rehabilitation Services Pager [phone] Office [phone]   [name] 10/27/20, 12:10 PM

## 2020-10-22 NOTE — Progress Notes (Addendum)
  Progress Note    Oct 30, 2020 7:45 AM 6 Days Post-Op  Subjective:  intubated and sedated with family present   Vitals:   2020-10-30 0100 October 30, 2020 0420  BP: (!) 112/33   Pulse: (!) 56 (!) 119  Resp: 18 (!) 26  Temp: (!) 97.2 F (36.2 C) (!) 96.1 F (35.6 C)  SpO2: 96% 99%   Physical Exam: Lungs:  mechanical ventilation Extremities:  feet warm without tissue loss Neurologic: sedated  CBC    Component Value Date/Time   WBC 44.9 (H) 10/30/2020 0411   RBC 3.16 (L) 10/30/2020 0411   HGB 9.1 (L) 2020/10/30 0411   HCT 29.6 (L) October 30, 2020 0411   PLT 108 (L) 30-Oct-2020 0411   MCV 93.7 Oct 30, 2020 0411   MCH 28.8 10/30/2020 0411   MCHC 30.7 30-Oct-2020 0411   RDW 17.3 (H) 10/30/2020 0411   LYMPHSABS 1.7 10/01/2020 1226   MONOABS 0.8 10/18/2020 1226   EOSABS 0.4 10/03/2020 1226   BASOSABS 0.1 10/03/2020 1226    BMET    Component Value Date/Time   NA 134 (L) 30-Oct-2020 0411   NA 142 07/03/2019 1116   K 4.4 10-30-2020 0411   CL 100 2020/10/30 0411   CO2 22 Oct 30, 2020 0411   GLUCOSE 205 (H) 2020/10/30 0411   BUN 16 10-30-2020 0411   BUN 21 07/03/2019 1116   CREATININE 1.28 (H) 10/30/20 0411   CALCIUM 7.6 (L) October 30, 2020 0411   GFRNONAA 43 (L) Oct 30, 2020 0411   GFRAA 57 (L) 07/03/2019 1116    INR    Component Value Date/Time   INR 1.4 (H) 10-30-2020 0411     Intake/Output Summary (Last 24 hours) at 10-30-20 0745 Last data filed at Oct 30, 2020 5093 Gross per 24 hour  Intake 3536.21 ml  Output 5817 ml  Net -2280.79 ml     Assessment/Plan:  75 y.o. female is s/p 1.  Right axillary femoral bypass (8 mm dacron)  2.  Left axillary to superficial femoral artery bypass (8 mm ring supported PTFE)  3.  Removal of aortobifemoral bypass with oversewing of aortic stump just below the renal arteries  4.  Vein patch angioplasty left common femoral artery  6 Days Post-Op   Patient remains critically ill on pressor support and experiencing multisystem failure ID: WBC  remains high, continue daptomycin, cefepime, and flagyl for now CRRT per Nephrology GI: NPO, continue tpn, continue ng  No clinical improvement noted overnight.  Family met with palliative care yesterday and are leaning toward transitioning to comfort care today; would like to meet with Dr. Carlis Abbott and palliative care again today prior to deciding   Dagoberto Ligas, PA-C [facility] [phone] October 30, 2020 7:45 AM   Patient has just undergone terminal extubation.  Family at the bedside.  All questions were answered.  Annamarie Major

## 2020-10-22 NOTE — Progress Notes (Signed)
Nutrition Brief Note  Chart reviewed. Pt now transitioning to comfort care. TPN to be discontinued No further nutrition interventions planned at this time.  Please re-consult as needed.   Kerman Passey MS, RDN, LDN, CNSC Registered Dietitian III Clinical Nutrition RD Pager and On-Call Pager Number Located in [place]

## 2020-10-22 NOTE — Progress Notes (Signed)
PT Cancellation Note  Patient Details Name: [name] MRN: [id number] DOB: 26-Mar-1945   Cancelled Treatment:    Reason Eval/Treat Not Completed: Other (comment)   Acknowledged discontinued  PT order with pt transitioning to comfort care.  PT signing off.      [name] Jeffrey Voth 10/15/2020, 1:43 PM

## 2020-10-22 DEATH — deceased

## 2020-10-27 MED FILL — Medication: Qty: 1 | Status: AC

## 2020-11-03 ENCOUNTER — Ambulatory Visit: Payer: Medicare HMO | Admitting: Family

## 2020-12-22 NOTE — Death Summary Note (Signed)
  DEATH SUMMARY   Patient Details  Name: [name] MRN: [id number] DOB: [birth year]  Admission/Discharge Information   Admit Date:  03-Oct-2020  Date of Death: Date of Death: 10/12/20  Time of Death: Time of Death: 05-12-1230  Length of Stay: 2022-05-04  Referring Physician: Jolinda Croak, MD     Diagnoses  Aortobifemoral bypass graft infection. Duodenal injury peri-op s/p patch repair Ureteroureterostomy after ureter repair Postoperative multiorgan failure: renal/pulmonary/heart/GI/marrow/CNS HCAP  Brief Hospital Course (including significant findings, care, treatment, and services provided and events leading to death)  Patient is a 75 yo F w/ pertinent PMH of carotid artery occlusion, CVA, hyper otitis, HTN, HLD, hypothyroidism, IBS, hydronephrosis, COPD, MI, PVD, PAF, aortobifemoral bypass graft 10 years ago presents to [facility] on 10-03-2020 with left femoral stent bleeding and infection.   Patient has been on Omadacycline for 2 days prior to admission for infection and has recently noticed profuse bleeding from the site. Has had I&D of left groin in December 2021. Bleeding was stopped and EMS called to bring to [facility] on 04-Oct-2022. Last dose of Eliquis for PAF on 8/8. Hgb and bp stable. Vascular surgery consulted. On 10/04/2022 Status postrepair of left femoral anastomosis with bovine pericardial patch for bleeding surgery performed. 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 Started on Vanc/cefepime. Infectious disease consulted 8/10. On Vanc and ceftriaxone.   On 8/12, Vascular surgery  performed: REMOVAL OF AORTA BI-FEMORAL BYPASS GRAFT, placed RIGTH AXILLARY BI-FEMORAL BYPASS GRAFT and LEFT AXILLARY POPLITEAL BYPASS GRAFT. There was small serosal injury to duodenum s/p procedure. General Surgery consulted. Patched with omentum and left NG tube with drain. Urology placed Right ureteroureterostomy with ureteral stent placement/exchange for complete mid ureteral transection. Patient arrived to ICU intubated.  Despite  aggressive interventions including mechanical ventilation, CRRT, pressors, antibiotics, patient continued to decline and family decided to allow her to pass in peace off ventilator.  Pertinent Labs and Studies  Significant Diagnostic Studies No results found.  Microbiology No results found for this or any previous visit (from the past 240 hour(s)).  Lab Basic Metabolic Panel: No results for input(s): NA, K, CL, CO2, GLUCOSE, BUN, CREATININE, CALCIUM, MG, PHOS in the last 168 hours. Liver Function Tests: No results for input(s): AST, ALT, ALKPHOS, BILITOT, PROT, ALBUMIN in the last 168 hours. No results for input(s): LIPASE, AMYLASE in the last 168 hours. No results for input(s): AMMONIA in the last 168 hours. CBC: No results for input(s): WBC, NEUTROABS, HGB, HCT, MCV, PLT in the last 168 hours. Cardiac Enzymes: No results for input(s): CKTOTAL, CKMB, CKMBINDEX, TROPONINI in the last 168 hours. Sepsis Labs: No results for input(s): PROCALCITON, WBC, LATICACIDVEN in the last 168 hours.     Candee Furbish 11/23/2020, 4:36 PM

## 2022-08-16 IMAGING — DX DG CHEST 1V PORT
1 series · 1 of 1 positions shown · non-contrast
Comparison: Radiograph 10/12/2015

CLINICAL DATA: COPD

EXAM:
PORTABLE CHEST 1 VIEW

[chest]
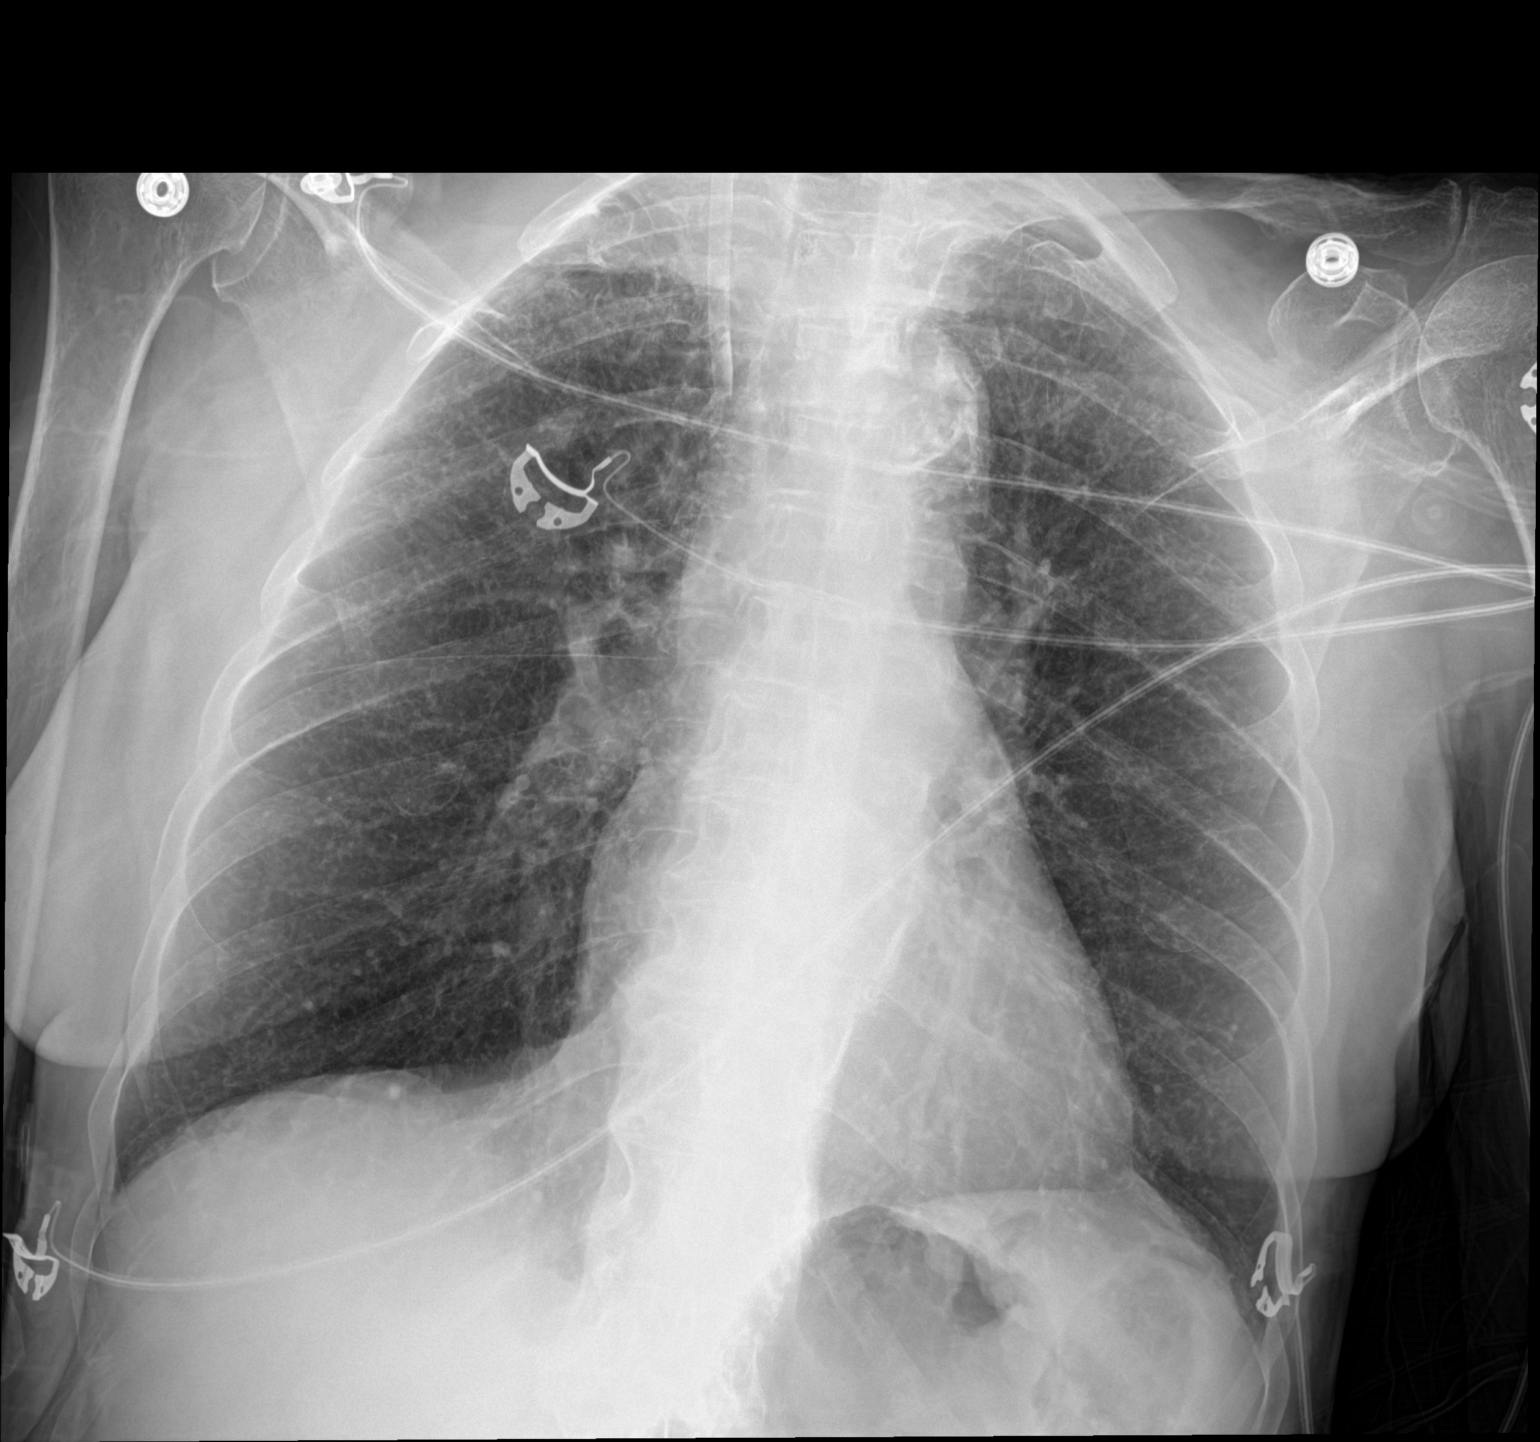

[1 of 1 positions shown; findings below may reference images not displayed]

FINDINGS: Right IJ approach central venous catheter tip terminates in the mid
to upper SVC. Stable cardiomediastinal contours with a calcified
aorta.

Diffusely coarsened interstitial and bronchitic changes are similar
to comparison prior exams slightly accentuated by diminished volumes
and portable technique. Biapical areas of pleuroparenchymal scarring
are similar to comparison studies. No new consolidative process is
seen. No pneumothorax. No visible layering effusion. The osseous
structures appear diffusely demineralized which may limit detection
of small or nondisplaced fractures. Degenerative changes are present
in the imaged spine and shoulders. No acute osseous or soft tissue
abnormality.
IMPRESSION: Diffusely coarsened interstitial and bronchitic features in keeping
with history of COPD, changes are slightly accentuated by portable
technique and low volumes.

No acute cardiopulmonary abnormality.

Aortic Atherosclerosis ([id number].O).

Right IJ central venous catheter tip terminates in the mid to upper
SVC.

Surgical material at the base the left neck.

## 2022-08-19 IMAGING — RF DG RETROGRADE PYELOGRAM
1 series · 2 of 2 positions shown · non-contrast
Comparison: CT 09/29/2020

CLINICAL DATA: Ureteral stent placement

EXAM:
ABDOMINAL FLUOROSCOPY

[Series 1: run · 2 of 2 slices shown]
[im 1/2]
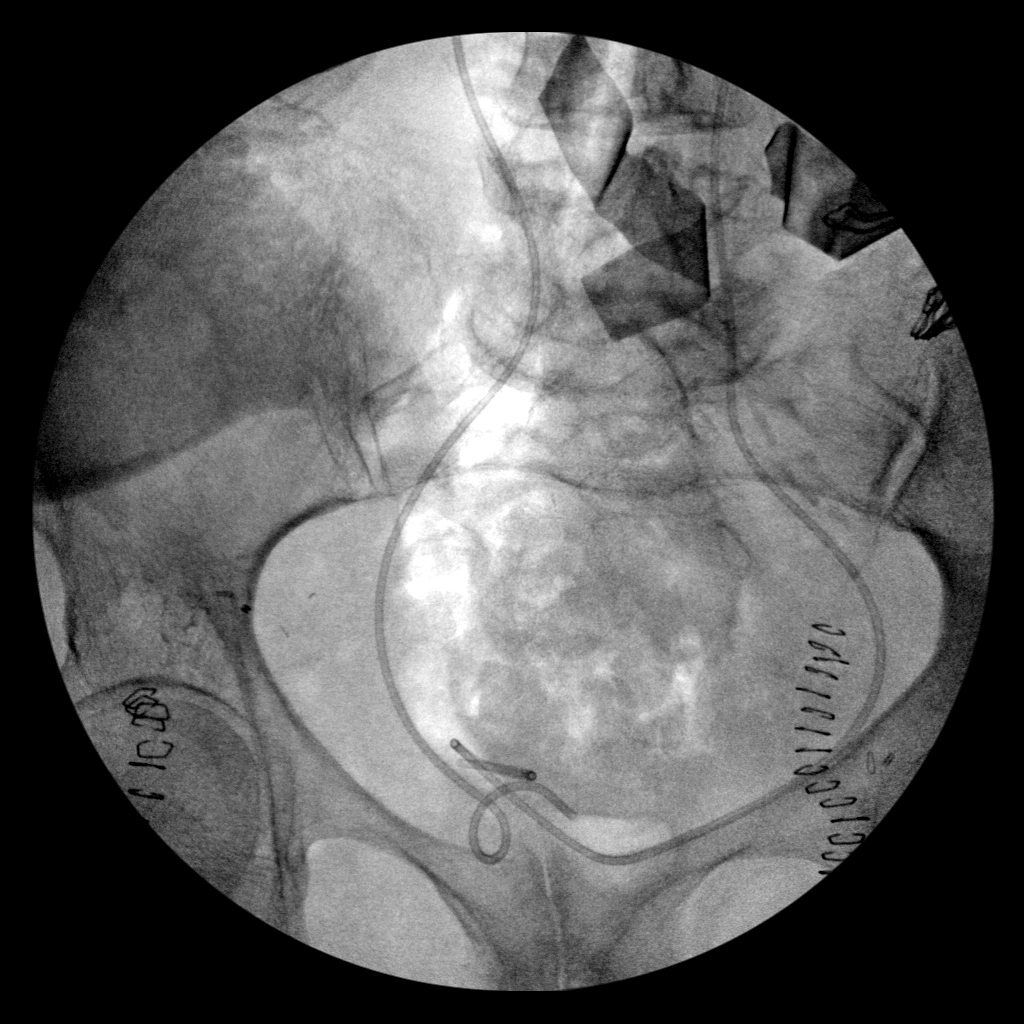
[im 2/2]
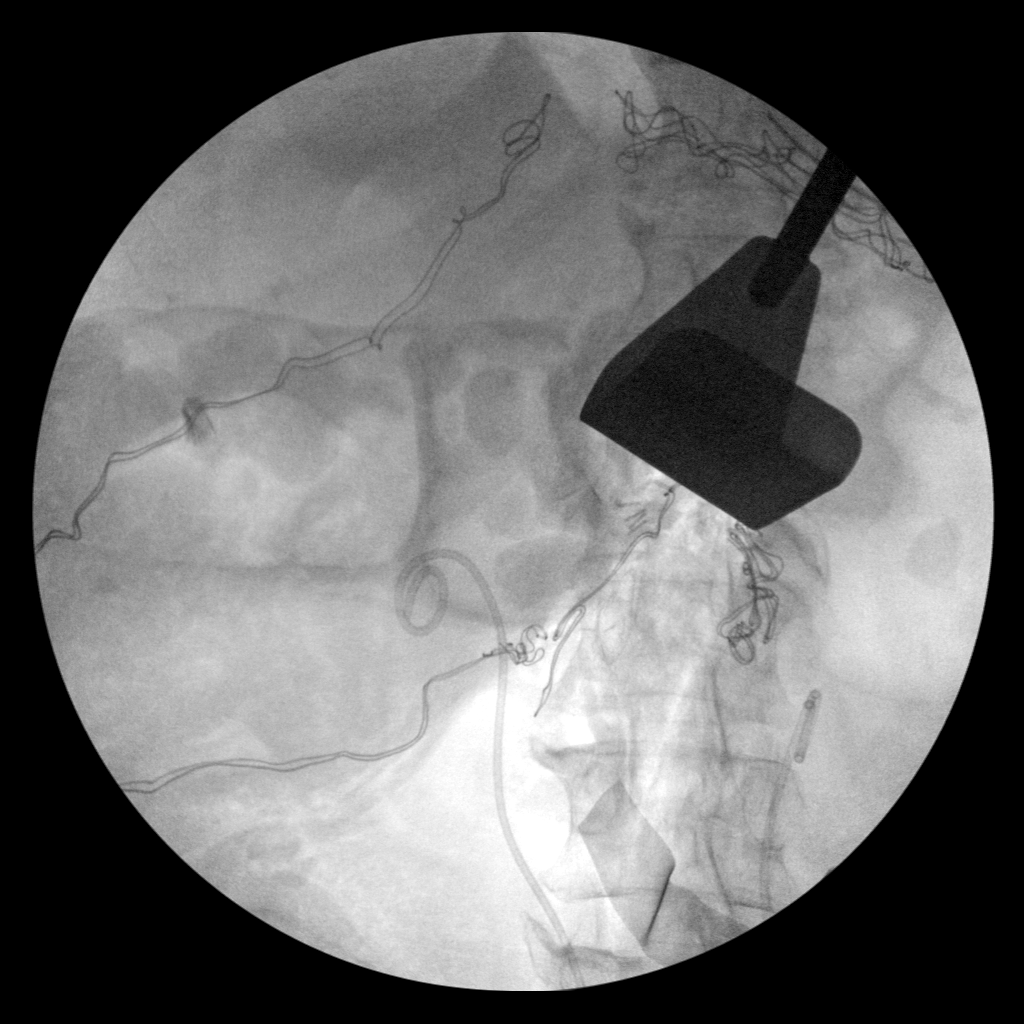

[2 of 2 positions shown; findings below may reference images not displayed]

FINDINGS: 2 fluoroscopic spot images document placement of bilateral double-j
ureteral stents.
IMPRESSION: Bilateral ureteral stent placement

## 2022-08-19 IMAGING — DX DG CHEST 1V PORT
1 series · 1 of 1 positions shown · non-contrast
Comparison: 09/29/2020, 10/12/2015

CLINICAL DATA: Post surgery

EXAM:
PORTABLE CHEST 1 VIEW

[chest]
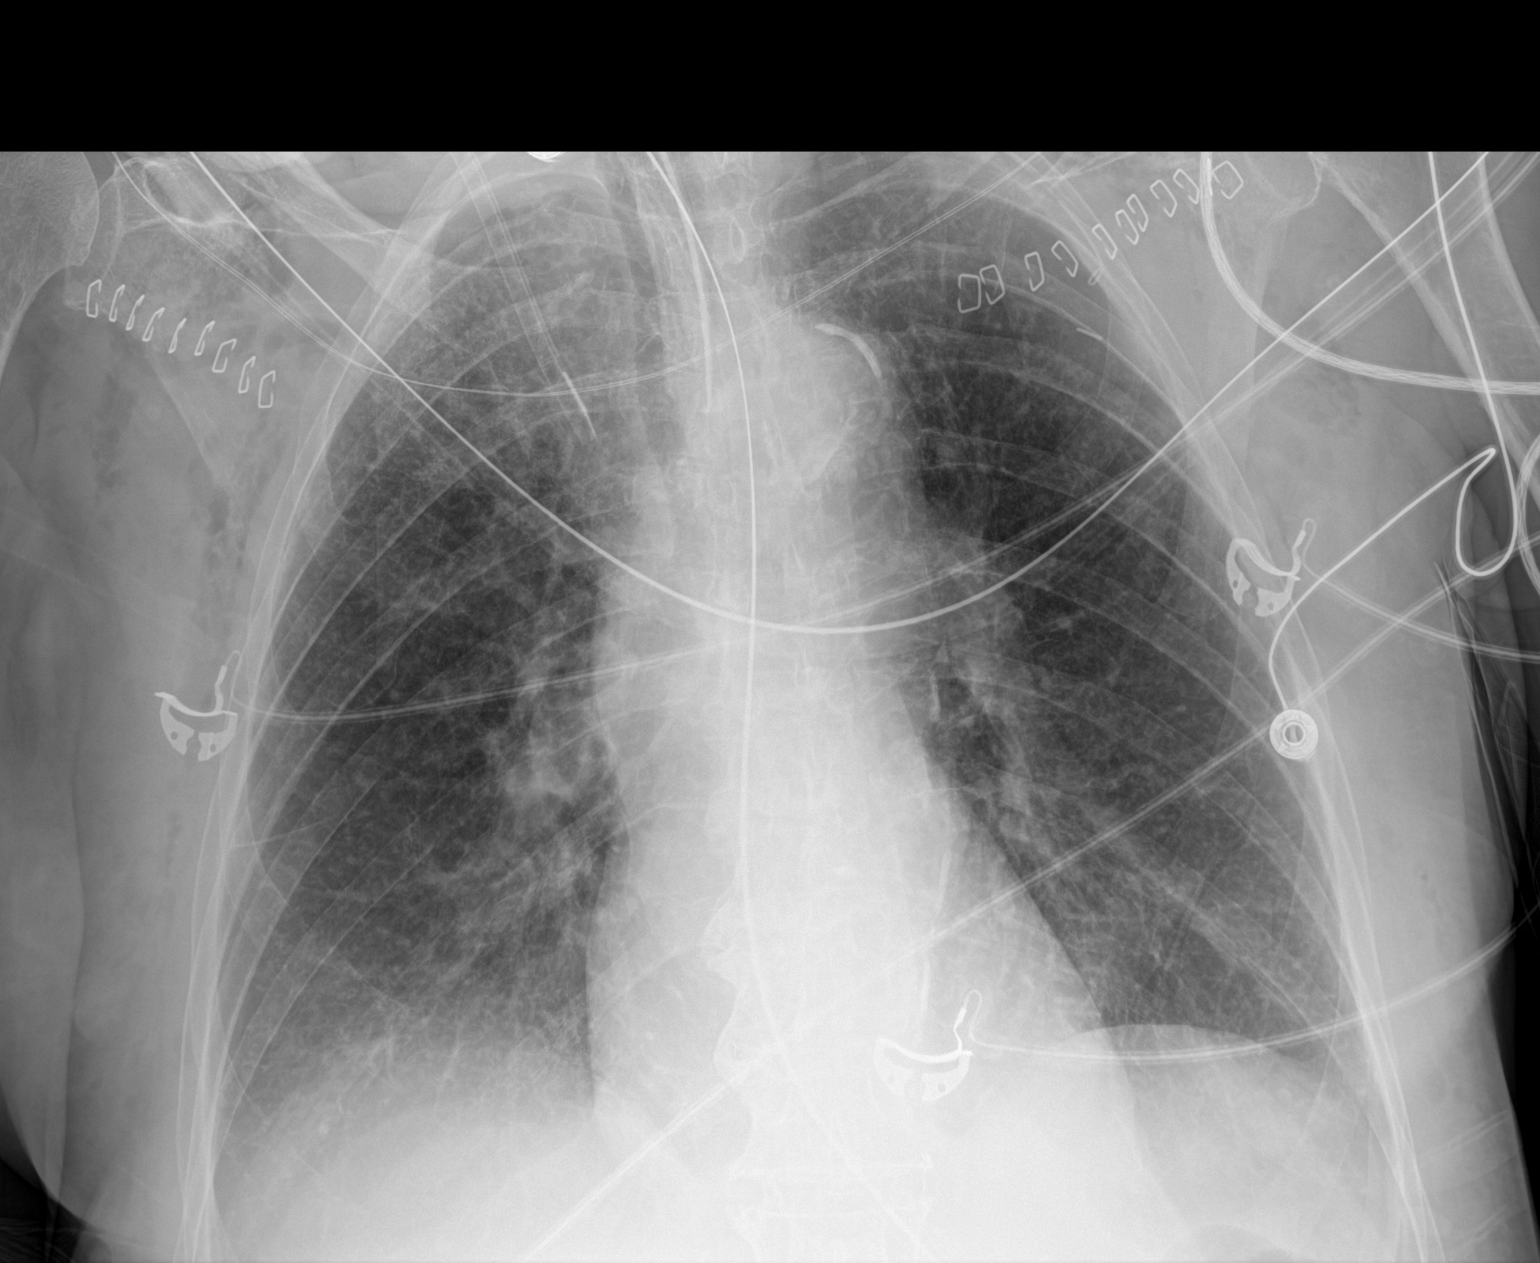

[1 of 1 positions shown; findings below may reference images not displayed]

FINDINGS: Interval intubation, tip of the endotracheal tube is about 3 cm
superior to the carina. Esophageal tube tip below the diaphragm but
incompletely visualized. Right IJ catheter sheath over the SVC
confluence. New airspace disease the right base. Normal cardiac
size. Aortic atherosclerosis. No definitive pneumothorax. Gas within
the subcutaneous soft tissues over the axillary regions.
IMPRESSION: 1. Placement of support lines and tubes as above.
2. New soft tissue gas over the axillary regions bilaterally
3. New airspace disease at the right base which could be due to
atelectasis or possibly aspiration

## 2022-08-20 IMAGING — DX DG CHEST 1V PORT
1 series · 2 of 2 positions shown · non-contrast
Comparison: the previous day's study

CLINICAL DATA: Reason for exam: acute respiratory failure with
hypoxia Hx of carotid artery occlusion, MI, stroke

EXAM:
PORTABLE CHEST - 1 VIEW

[Series 1: chest · 0.14mm/px · 2 of 2 slices shown]
[im 1/2]
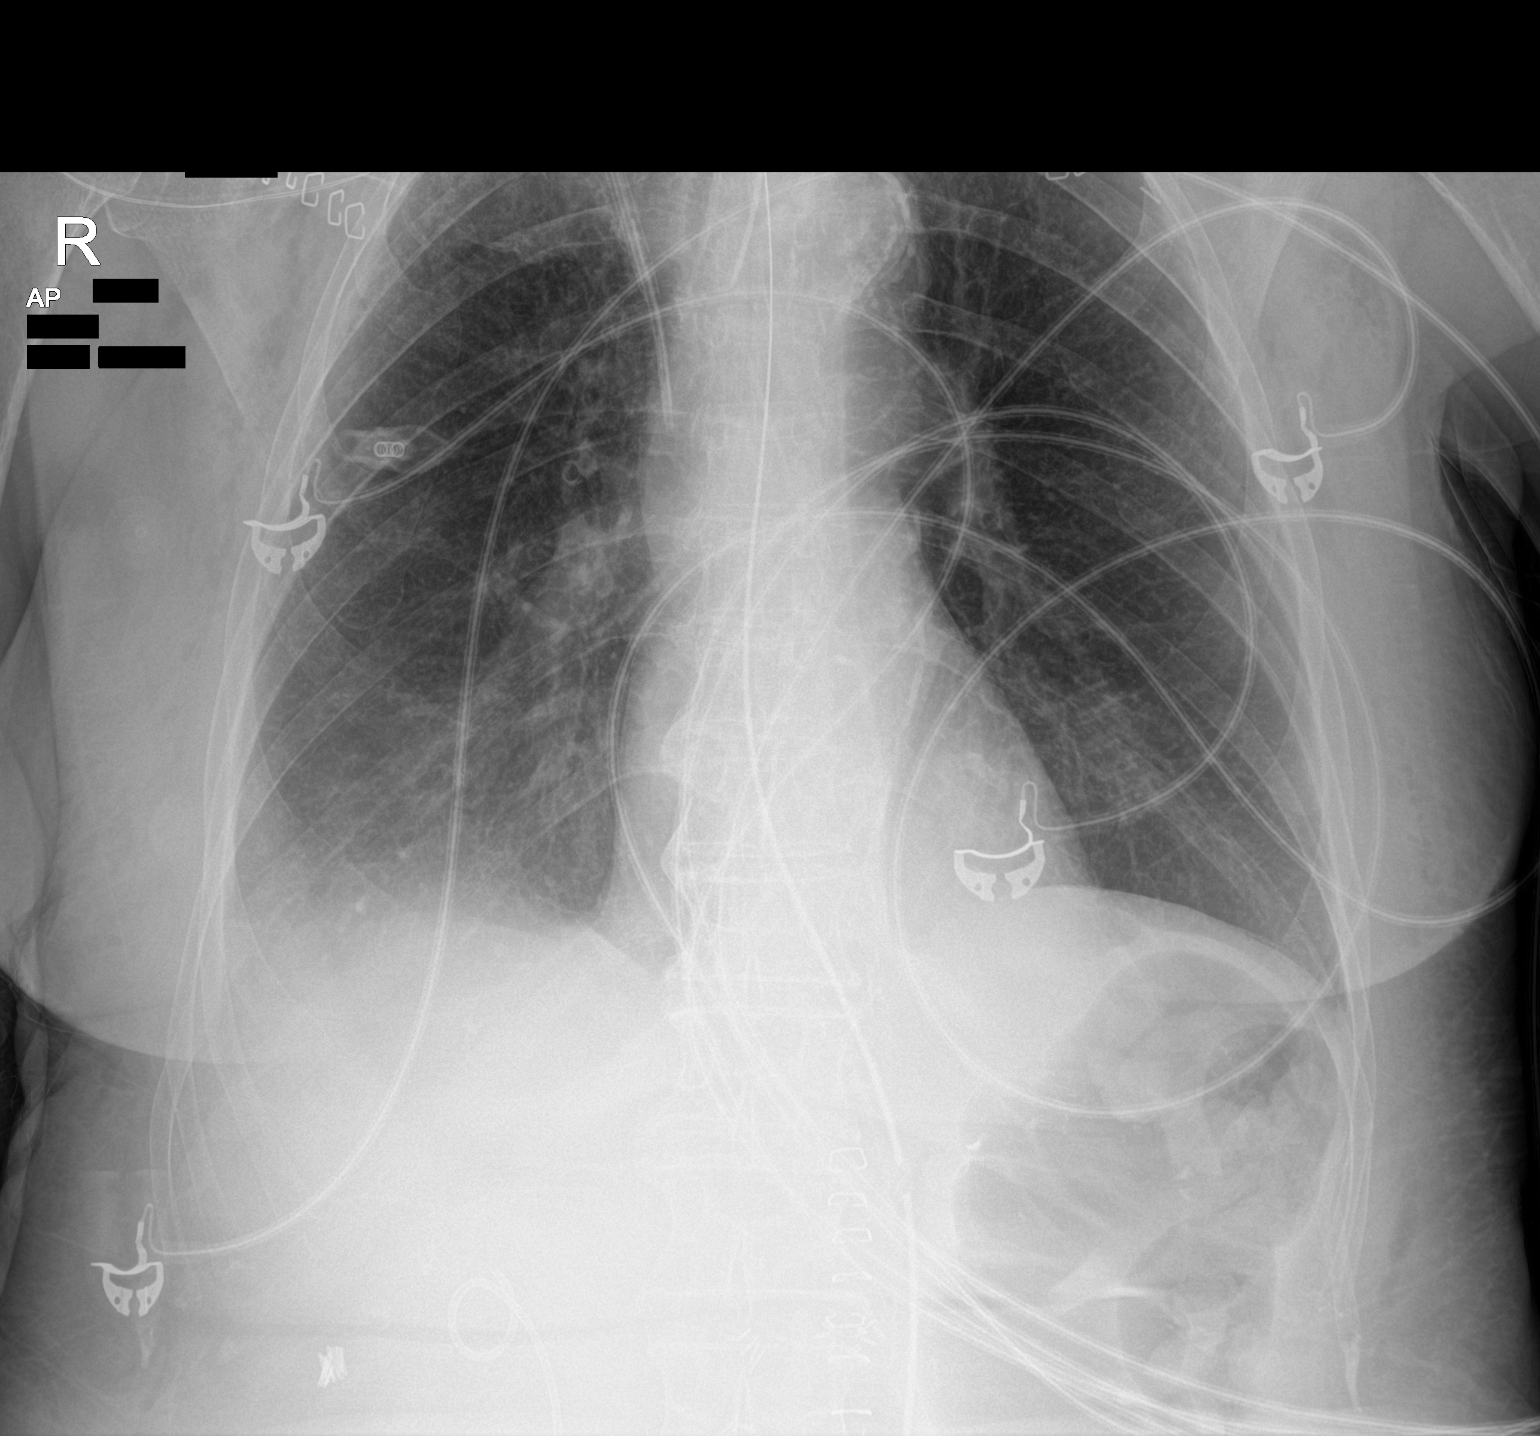
[im 2/2]
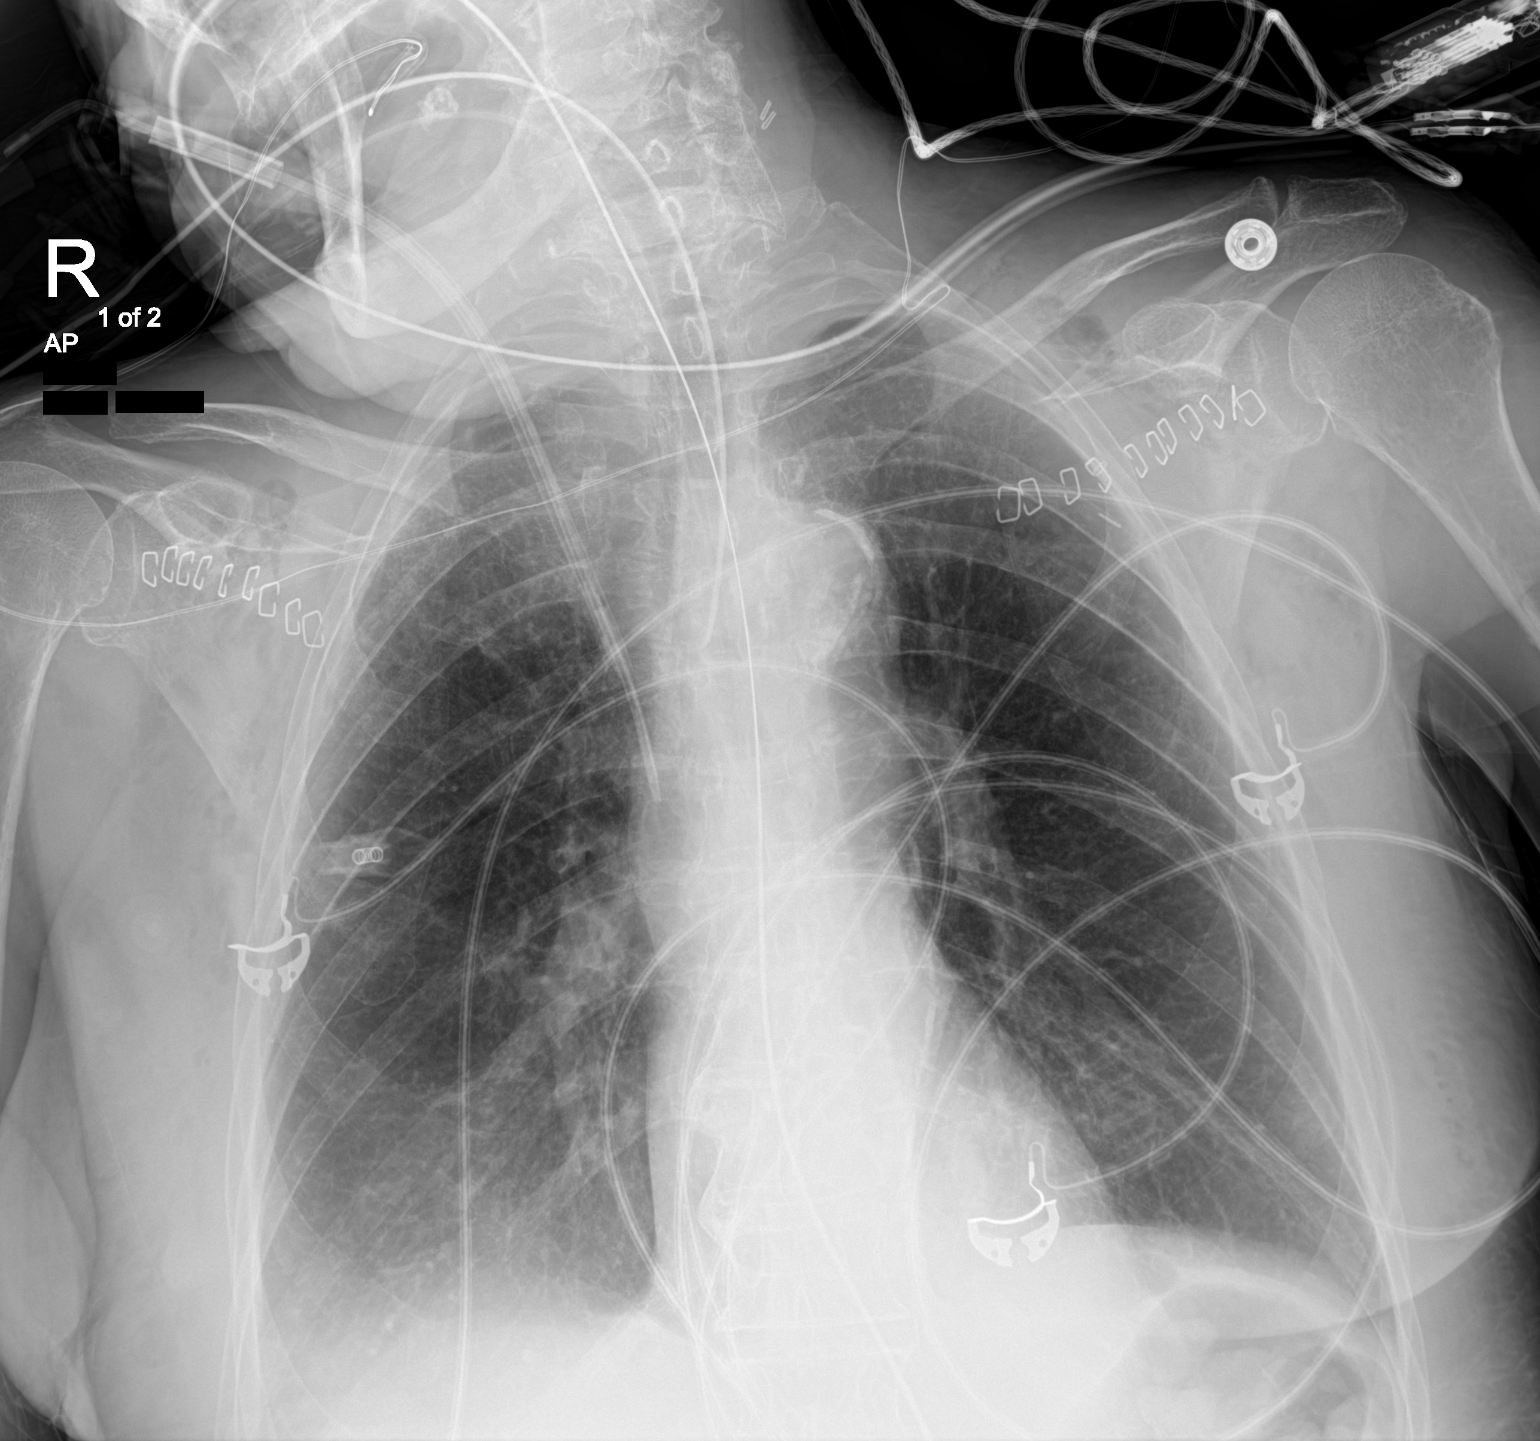

[2 of 2 positions shown; findings below may reference images not displayed]

FINDINGS: Endotracheal tube, nasogastric tube, and right IJ central venous
catheter remain in place. Lungs are clear. Small right pleural
effusion.

Heart size normal.  Aortic Atherosclerosis ([id number]-170.0).

No pneumothorax.

Skin staples project over the axillary regions. Missing dentition.
Cholecystectomy clips. Right ureteral stent proximal aspect is
noted.
IMPRESSION: 1. Support hardware stable in position.
2. small right pleural effusion.

## 2022-08-24 IMAGING — DX DG CHEST 1V PORT
1 series · 1 of 1 positions shown · non-contrast
Comparison: 10/05/2020.

CLINICAL DATA: Endotracheal tube placement.

EXAM:
PORTABLE CHEST 1 VIEW

[chest ap]
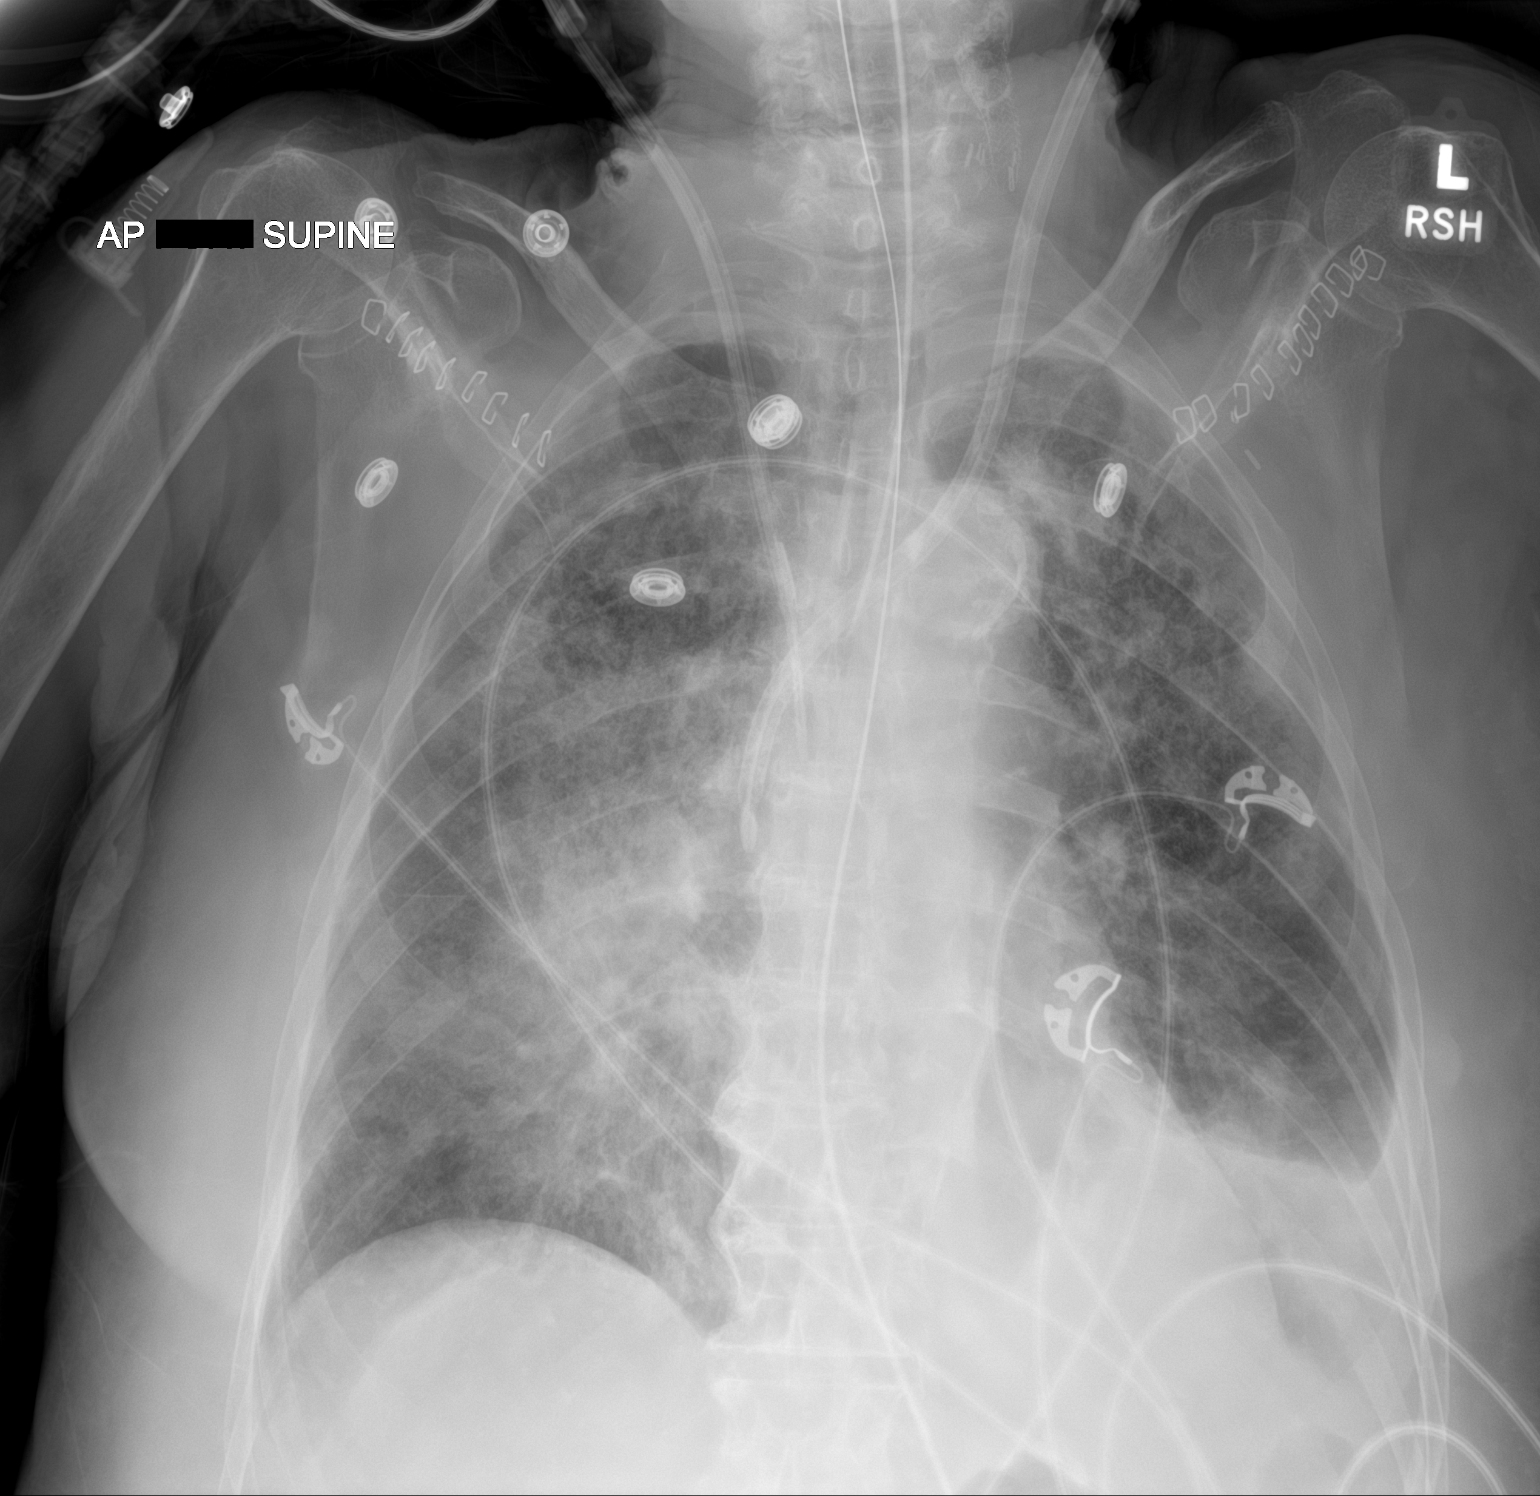

[1 of 1 positions shown; findings below may reference images not displayed]

FINDINGS: Endotracheal tube terminates approximately 2.1 cm above the carina.
Nasogastric tube is followed into the stomach with the tip
projecting beyond the inferior margin of the image. Bilateral
internal jugular central lines terminate in the SVC.

Heart size normal. Thoracic aorta is calcified. Mixed interstitial
and airspace opacification bilaterally, worst in the right perihilar
region and increased from 10/05/2020. Moderate left pleural
effusion, increased. Surgical skin staples in both high axillary
regions.
IMPRESSION: 1. Increasing mixed interstitial and airspace opacification may be
due to edema or pneumonia.
2. Moderate left pleural effusion, increased.
